# Patient Record
Sex: Female | Born: 1935 | Race: White | Hispanic: No | Smoking: Never smoker
Health system: Southern US, Community
[De-identification: ages and names within clinical notes are randomized; demographics above are authoritative.]

## PROBLEM LIST (undated history)

## (undated) DIAGNOSIS — I639 Cerebral infarction, unspecified: Secondary | ICD-10-CM

## (undated) DIAGNOSIS — I10 Essential (primary) hypertension: Secondary | ICD-10-CM

## (undated) DIAGNOSIS — T7840XA Allergy, unspecified, initial encounter: Secondary | ICD-10-CM

## (undated) DIAGNOSIS — D519 Vitamin B12 deficiency anemia, unspecified: Secondary | ICD-10-CM

## (undated) DIAGNOSIS — E785 Hyperlipidemia, unspecified: Secondary | ICD-10-CM

## (undated) DIAGNOSIS — E079 Disorder of thyroid, unspecified: Secondary | ICD-10-CM

## (undated) HISTORY — DX: Vitamin B12 deficiency anemia, unspecified: D51.9

## (undated) HISTORY — DX: Disorder of thyroid, unspecified: E07.9

## (undated) HISTORY — DX: Hyperlipidemia, unspecified: E78.5

## (undated) HISTORY — DX: Essential (primary) hypertension: I10

## (undated) HISTORY — DX: Allergy, unspecified, initial encounter: T78.40XA

## (undated) HISTORY — PX: ECTOPIC PREGNANCY SURGERY: SHX613

## (undated) HISTORY — DX: Cerebral infarction, unspecified: I63.9

---

## 2004-07-01 ENCOUNTER — Ambulatory Visit: Payer: Self-pay | Admitting: Family Medicine

## 2005-08-18 ENCOUNTER — Ambulatory Visit: Payer: Self-pay | Admitting: Family Medicine

## 2006-07-06 ENCOUNTER — Ambulatory Visit: Payer: Self-pay | Admitting: Gastroenterology

## 2006-08-19 ENCOUNTER — Ambulatory Visit: Payer: Self-pay | Admitting: Family Medicine

## 2007-05-06 ENCOUNTER — Ambulatory Visit: Payer: Self-pay | Admitting: Family Medicine

## 2007-09-09 ENCOUNTER — Ambulatory Visit: Payer: Self-pay | Admitting: Family Medicine

## 2008-11-23 ENCOUNTER — Ambulatory Visit: Payer: Self-pay | Admitting: Family Medicine

## 2009-12-17 ENCOUNTER — Ambulatory Visit: Payer: Self-pay | Admitting: Family Medicine

## 2010-08-06 ENCOUNTER — Ambulatory Visit: Payer: Self-pay | Admitting: Family Medicine

## 2010-08-11 HISTORY — PX: COLONOSCOPY: SHX174

## 2011-01-02 ENCOUNTER — Ambulatory Visit: Payer: Self-pay | Admitting: Family Medicine

## 2011-08-15 DIAGNOSIS — E039 Hypothyroidism, unspecified: Secondary | ICD-10-CM | POA: Diagnosis not present

## 2011-08-15 DIAGNOSIS — I10 Essential (primary) hypertension: Secondary | ICD-10-CM | POA: Diagnosis not present

## 2011-08-15 DIAGNOSIS — E785 Hyperlipidemia, unspecified: Secondary | ICD-10-CM | POA: Diagnosis not present

## 2011-08-15 DIAGNOSIS — J209 Acute bronchitis, unspecified: Secondary | ICD-10-CM | POA: Diagnosis not present

## 2011-11-19 DIAGNOSIS — N390 Urinary tract infection, site not specified: Secondary | ICD-10-CM | POA: Diagnosis not present

## 2011-11-19 DIAGNOSIS — J209 Acute bronchitis, unspecified: Secondary | ICD-10-CM | POA: Diagnosis not present

## 2011-12-31 DIAGNOSIS — Z0000 Encounter for general adult medical examination without abnormal findings: Secondary | ICD-10-CM | POA: Diagnosis not present

## 2012-01-01 DIAGNOSIS — H40029 Open angle with borderline findings, high risk, unspecified eye: Secondary | ICD-10-CM | POA: Diagnosis not present

## 2012-01-01 DIAGNOSIS — H2510 Age-related nuclear cataract, unspecified eye: Secondary | ICD-10-CM | POA: Diagnosis not present

## 2012-01-14 ENCOUNTER — Ambulatory Visit: Payer: Self-pay | Admitting: Family Medicine

## 2012-01-14 DIAGNOSIS — Z1231 Encounter for screening mammogram for malignant neoplasm of breast: Secondary | ICD-10-CM | POA: Diagnosis not present

## 2012-01-26 DIAGNOSIS — J209 Acute bronchitis, unspecified: Secondary | ICD-10-CM | POA: Diagnosis not present

## 2012-01-26 DIAGNOSIS — J019 Acute sinusitis, unspecified: Secondary | ICD-10-CM | POA: Diagnosis not present

## 2012-02-02 DIAGNOSIS — H40029 Open angle with borderline findings, high risk, unspecified eye: Secondary | ICD-10-CM | POA: Diagnosis not present

## 2012-05-17 DIAGNOSIS — Z23 Encounter for immunization: Secondary | ICD-10-CM | POA: Diagnosis not present

## 2012-08-26 ENCOUNTER — Ambulatory Visit: Payer: Self-pay | Admitting: Family Medicine

## 2012-08-26 DIAGNOSIS — M5137 Other intervertebral disc degeneration, lumbosacral region: Secondary | ICD-10-CM | POA: Diagnosis not present

## 2012-08-26 DIAGNOSIS — E785 Hyperlipidemia, unspecified: Secondary | ICD-10-CM | POA: Diagnosis not present

## 2012-08-26 DIAGNOSIS — N390 Urinary tract infection, site not specified: Secondary | ICD-10-CM | POA: Diagnosis not present

## 2012-08-26 DIAGNOSIS — E039 Hypothyroidism, unspecified: Secondary | ICD-10-CM | POA: Diagnosis not present

## 2012-08-26 DIAGNOSIS — I10 Essential (primary) hypertension: Secondary | ICD-10-CM | POA: Diagnosis not present

## 2012-08-26 DIAGNOSIS — D510 Vitamin B12 deficiency anemia due to intrinsic factor deficiency: Secondary | ICD-10-CM | POA: Diagnosis not present

## 2013-01-18 DIAGNOSIS — E039 Hypothyroidism, unspecified: Secondary | ICD-10-CM | POA: Diagnosis not present

## 2013-01-18 DIAGNOSIS — I10 Essential (primary) hypertension: Secondary | ICD-10-CM | POA: Diagnosis not present

## 2013-01-18 DIAGNOSIS — G589 Mononeuropathy, unspecified: Secondary | ICD-10-CM | POA: Diagnosis not present

## 2013-01-18 DIAGNOSIS — I43 Cardiomyopathy in diseases classified elsewhere: Secondary | ICD-10-CM | POA: Diagnosis not present

## 2013-01-18 DIAGNOSIS — E785 Hyperlipidemia, unspecified: Secondary | ICD-10-CM | POA: Diagnosis not present

## 2013-01-26 ENCOUNTER — Ambulatory Visit: Payer: Self-pay | Admitting: Family Medicine

## 2013-01-26 DIAGNOSIS — I6529 Occlusion and stenosis of unspecified carotid artery: Secondary | ICD-10-CM | POA: Diagnosis not present

## 2013-01-26 DIAGNOSIS — E785 Hyperlipidemia, unspecified: Secondary | ICD-10-CM | POA: Diagnosis not present

## 2013-01-26 DIAGNOSIS — N959 Unspecified menopausal and perimenopausal disorder: Secondary | ICD-10-CM | POA: Diagnosis not present

## 2013-02-09 ENCOUNTER — Ambulatory Visit: Payer: Self-pay | Admitting: Family Medicine

## 2013-02-09 DIAGNOSIS — Z1231 Encounter for screening mammogram for malignant neoplasm of breast: Secondary | ICD-10-CM | POA: Diagnosis not present

## 2013-02-09 DIAGNOSIS — M899 Disorder of bone, unspecified: Secondary | ICD-10-CM | POA: Diagnosis not present

## 2013-02-16 DIAGNOSIS — D2320 Other benign neoplasm of skin of unspecified ear and external auricular canal: Secondary | ICD-10-CM | POA: Diagnosis not present

## 2013-02-16 DIAGNOSIS — L821 Other seborrheic keratosis: Secondary | ICD-10-CM | POA: Diagnosis not present

## 2013-02-16 DIAGNOSIS — D485 Neoplasm of uncertain behavior of skin: Secondary | ICD-10-CM | POA: Diagnosis not present

## 2013-02-16 DIAGNOSIS — L570 Actinic keratosis: Secondary | ICD-10-CM | POA: Diagnosis not present

## 2013-02-16 DIAGNOSIS — M7130 Other bursal cyst, unspecified site: Secondary | ICD-10-CM | POA: Diagnosis not present

## 2013-02-16 DIAGNOSIS — Z1283 Encounter for screening for malignant neoplasm of skin: Secondary | ICD-10-CM | POA: Diagnosis not present

## 2013-02-16 DIAGNOSIS — D1801 Hemangioma of skin and subcutaneous tissue: Secondary | ICD-10-CM | POA: Diagnosis not present

## 2013-03-24 DIAGNOSIS — H2510 Age-related nuclear cataract, unspecified eye: Secondary | ICD-10-CM | POA: Diagnosis not present

## 2013-03-24 DIAGNOSIS — H40029 Open angle with borderline findings, high risk, unspecified eye: Secondary | ICD-10-CM | POA: Diagnosis not present

## 2013-03-30 DIAGNOSIS — D485 Neoplasm of uncertain behavior of skin: Secondary | ICD-10-CM | POA: Diagnosis not present

## 2013-03-30 DIAGNOSIS — D2320 Other benign neoplasm of skin of unspecified ear and external auricular canal: Secondary | ICD-10-CM | POA: Diagnosis not present

## 2013-05-18 DIAGNOSIS — Z23 Encounter for immunization: Secondary | ICD-10-CM | POA: Diagnosis not present

## 2013-05-21 IMAGING — CR DG LUMBAR SPINE COMPLETE 4+V
1 series · 5 of 5 positions shown · non-contrast
Comparison: none

REASON FOR EXAM: back pain
COMMENTS:

PROCEDURE:     MDR - M[REDACTED] SPINE WITH OBLIQUES  - August 26, 2012 [DATE]
RESULT:     Comparison: None.

[Series 1: ap · 0.17mm/px · 5 of 5 slices shown]
[im 1/5]
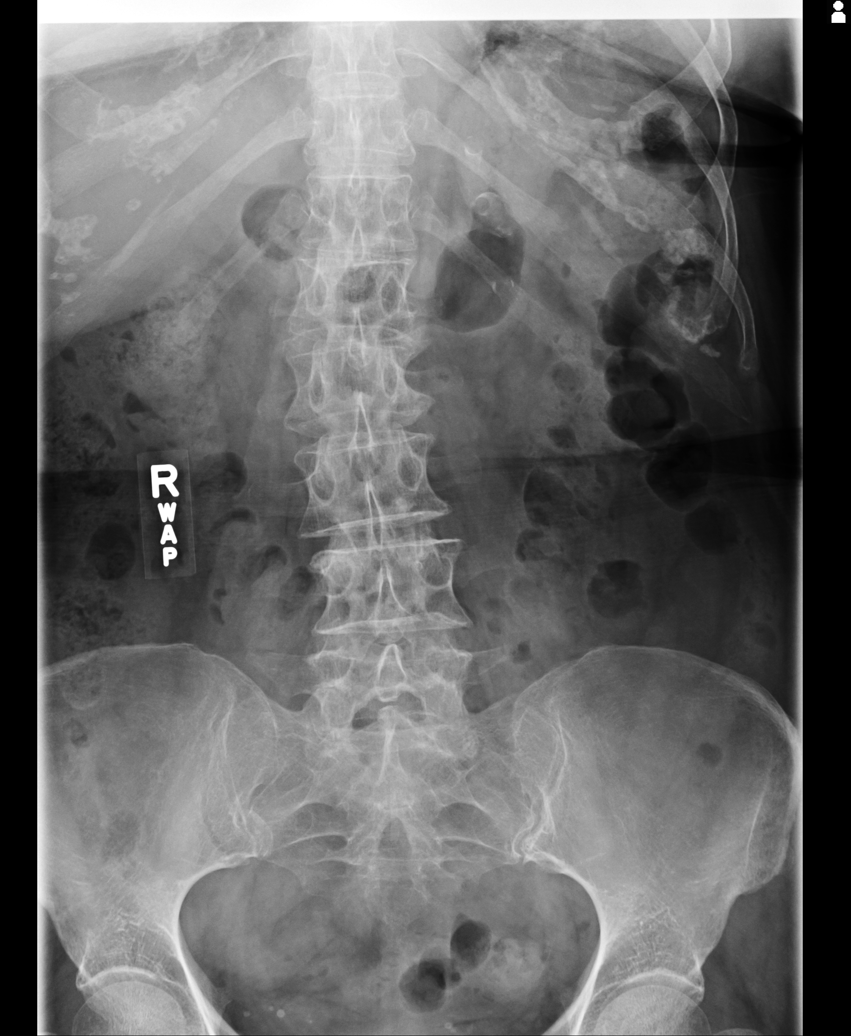
[im 2/5]
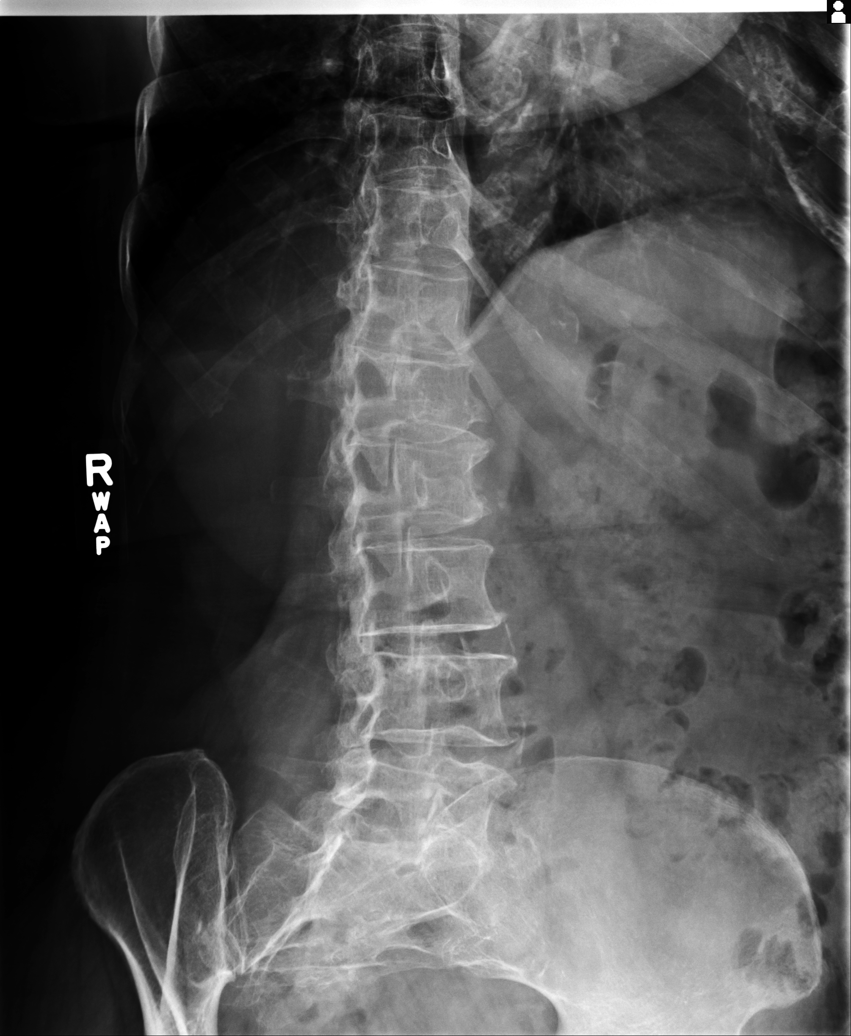
[im 3/5]
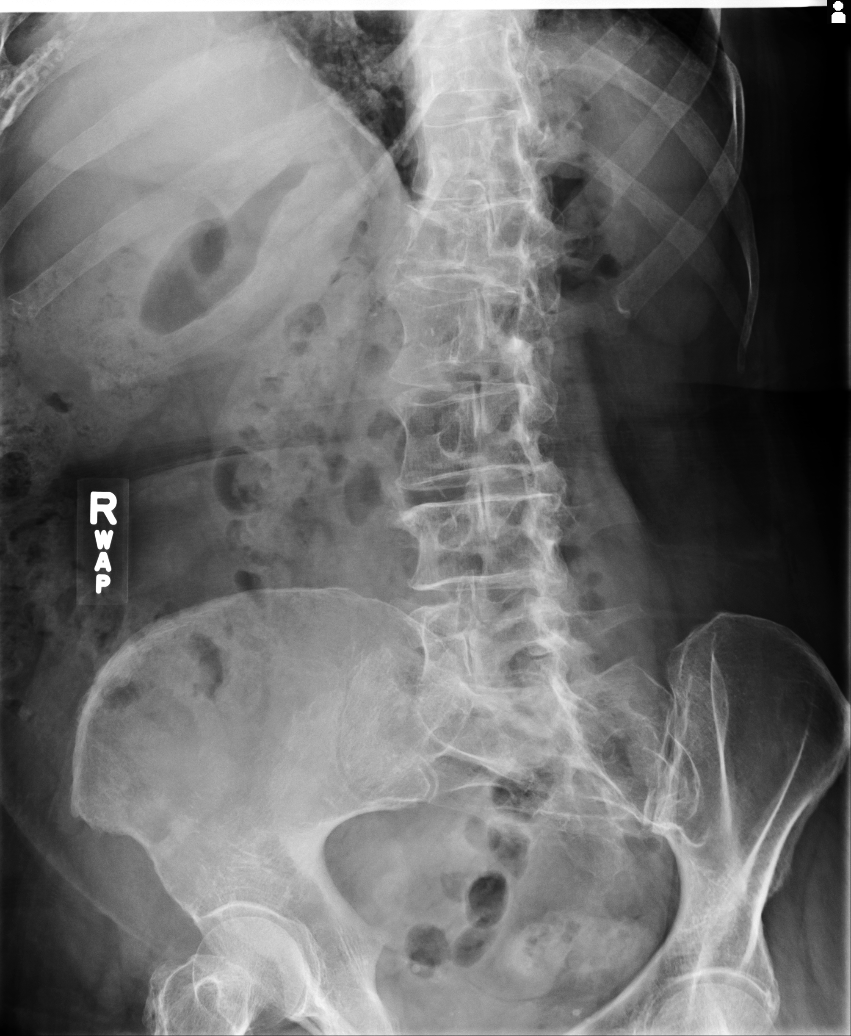
[im 4/5]
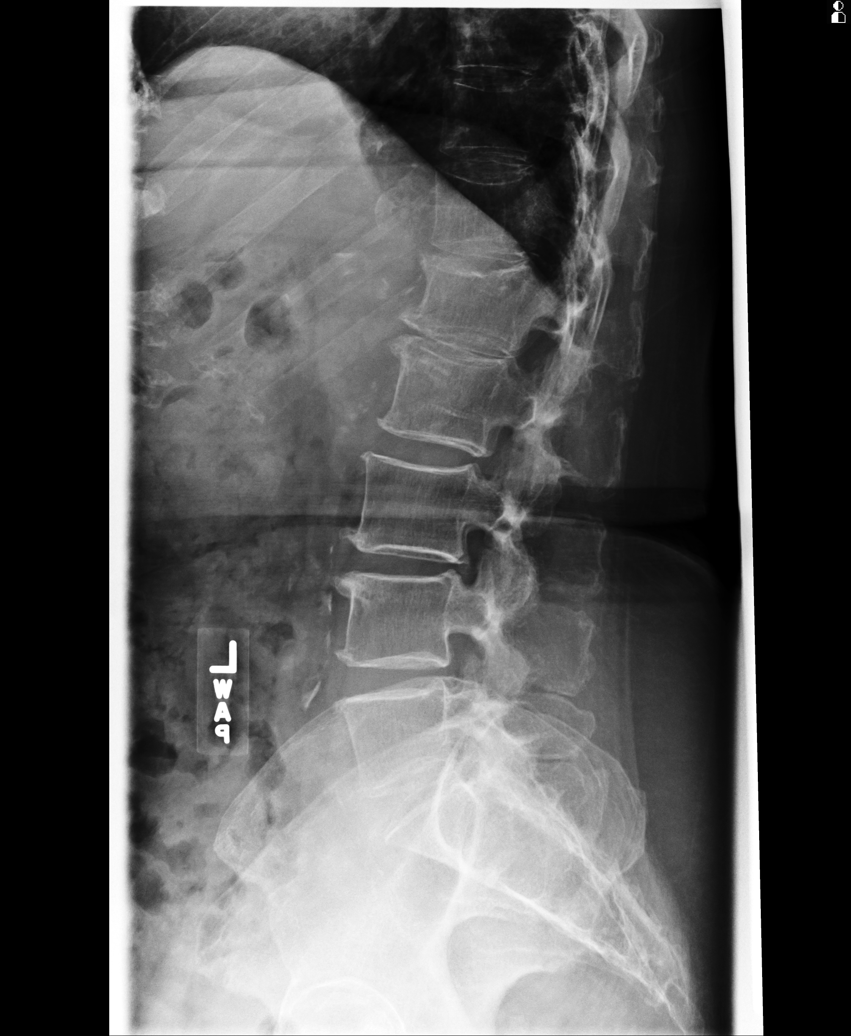
[im 5/5]
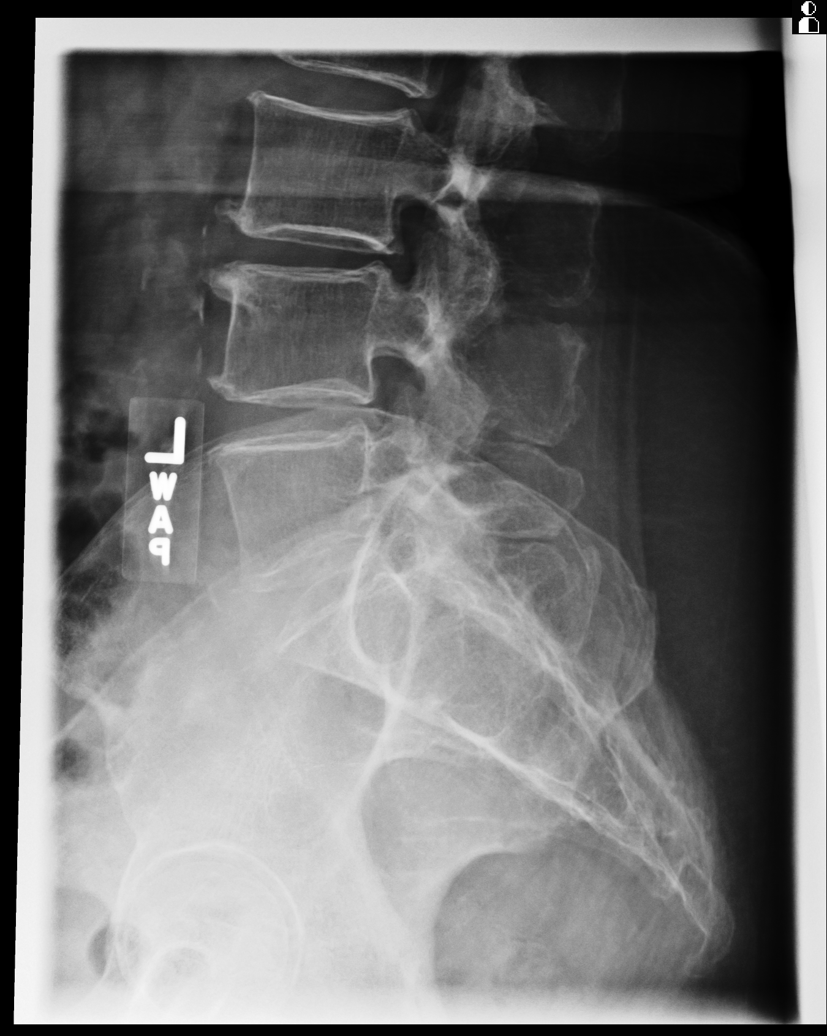

[5 of 5 positions shown; findings below may reference images not displayed]

FINDINGS: There are 5 lumbar type vertebral bodies. There is slight dextrocurvature of
the lumbar spine. No pars defect seen on the lateral view. There is mild
age-indeterminate anterior wedge compression deformity of the L1 vertebral
body. Mild intervertebral disc height loss and osteophytosis are seen at
T12-L1 and L1-L2. There is normal alignment. Calcifications are seen in
abdominal aorta.
IMPRESSION: 1. Mild degenerative disc disease in the upper lumbar spine.
2. Mild age-indeterminate anterior wedge compression deformity of the L1
vertebral body. Correlate with patient's history and site of pain.

[REDACTED]

## 2013-10-20 DIAGNOSIS — H2510 Age-related nuclear cataract, unspecified eye: Secondary | ICD-10-CM | POA: Diagnosis not present

## 2013-10-26 DIAGNOSIS — I10 Essential (primary) hypertension: Secondary | ICD-10-CM | POA: Diagnosis not present

## 2013-10-26 DIAGNOSIS — E039 Hypothyroidism, unspecified: Secondary | ICD-10-CM | POA: Diagnosis not present

## 2013-10-26 DIAGNOSIS — D510 Vitamin B12 deficiency anemia due to intrinsic factor deficiency: Secondary | ICD-10-CM | POA: Diagnosis not present

## 2013-10-26 DIAGNOSIS — E785 Hyperlipidemia, unspecified: Secondary | ICD-10-CM | POA: Diagnosis not present

## 2013-10-26 DIAGNOSIS — J3089 Other allergic rhinitis: Secondary | ICD-10-CM | POA: Diagnosis not present

## 2013-10-26 DIAGNOSIS — G589 Mononeuropathy, unspecified: Secondary | ICD-10-CM | POA: Diagnosis not present

## 2013-12-08 DIAGNOSIS — H40029 Open angle with borderline findings, high risk, unspecified eye: Secondary | ICD-10-CM | POA: Diagnosis not present

## 2013-12-09 DIAGNOSIS — R799 Abnormal finding of blood chemistry, unspecified: Secondary | ICD-10-CM | POA: Diagnosis not present

## 2014-01-03 DIAGNOSIS — H02839 Dermatochalasis of unspecified eye, unspecified eyelid: Secondary | ICD-10-CM | POA: Diagnosis not present

## 2014-01-03 DIAGNOSIS — H25019 Cortical age-related cataract, unspecified eye: Secondary | ICD-10-CM | POA: Diagnosis not present

## 2014-01-03 DIAGNOSIS — H25049 Posterior subcapsular polar age-related cataract, unspecified eye: Secondary | ICD-10-CM | POA: Diagnosis not present

## 2014-01-03 DIAGNOSIS — H2510 Age-related nuclear cataract, unspecified eye: Secondary | ICD-10-CM | POA: Diagnosis not present

## 2014-01-03 DIAGNOSIS — H18419 Arcus senilis, unspecified eye: Secondary | ICD-10-CM | POA: Diagnosis not present

## 2014-02-13 DIAGNOSIS — H269 Unspecified cataract: Secondary | ICD-10-CM | POA: Diagnosis not present

## 2014-02-13 DIAGNOSIS — H2510 Age-related nuclear cataract, unspecified eye: Secondary | ICD-10-CM | POA: Diagnosis not present

## 2014-02-14 DIAGNOSIS — H2510 Age-related nuclear cataract, unspecified eye: Secondary | ICD-10-CM | POA: Diagnosis not present

## 2014-02-14 DIAGNOSIS — H25019 Cortical age-related cataract, unspecified eye: Secondary | ICD-10-CM | POA: Diagnosis not present

## 2014-02-14 DIAGNOSIS — H25049 Posterior subcapsular polar age-related cataract, unspecified eye: Secondary | ICD-10-CM | POA: Diagnosis not present

## 2014-02-14 DIAGNOSIS — Z961 Presence of intraocular lens: Secondary | ICD-10-CM | POA: Diagnosis not present

## 2014-02-16 DIAGNOSIS — Z0000 Encounter for general adult medical examination without abnormal findings: Secondary | ICD-10-CM | POA: Diagnosis not present

## 2014-02-28 ENCOUNTER — Ambulatory Visit: Payer: Self-pay | Admitting: Family Medicine

## 2014-02-28 DIAGNOSIS — Z1231 Encounter for screening mammogram for malignant neoplasm of breast: Secondary | ICD-10-CM | POA: Diagnosis not present

## 2014-03-06 DIAGNOSIS — H2510 Age-related nuclear cataract, unspecified eye: Secondary | ICD-10-CM | POA: Diagnosis not present

## 2014-03-06 DIAGNOSIS — H269 Unspecified cataract: Secondary | ICD-10-CM | POA: Diagnosis not present

## 2014-03-14 DIAGNOSIS — L570 Actinic keratosis: Secondary | ICD-10-CM | POA: Diagnosis not present

## 2014-03-14 DIAGNOSIS — L821 Other seborrheic keratosis: Secondary | ICD-10-CM | POA: Diagnosis not present

## 2014-03-14 DIAGNOSIS — B372 Candidiasis of skin and nail: Secondary | ICD-10-CM | POA: Diagnosis not present

## 2014-03-14 DIAGNOSIS — Z872 Personal history of diseases of the skin and subcutaneous tissue: Secondary | ICD-10-CM | POA: Diagnosis not present

## 2014-03-14 DIAGNOSIS — Z1283 Encounter for screening for malignant neoplasm of skin: Secondary | ICD-10-CM | POA: Diagnosis not present

## 2014-06-05 DIAGNOSIS — J3081 Allergic rhinitis due to animal (cat) (dog) hair and dander: Secondary | ICD-10-CM | POA: Diagnosis not present

## 2014-06-05 DIAGNOSIS — J0140 Acute pansinusitis, unspecified: Secondary | ICD-10-CM | POA: Diagnosis not present

## 2014-07-03 DIAGNOSIS — I10 Essential (primary) hypertension: Secondary | ICD-10-CM | POA: Diagnosis not present

## 2014-07-03 DIAGNOSIS — I43 Cardiomyopathy in diseases classified elsewhere: Secondary | ICD-10-CM | POA: Diagnosis not present

## 2014-07-03 DIAGNOSIS — E784 Other hyperlipidemia: Secondary | ICD-10-CM | POA: Diagnosis not present

## 2014-07-03 DIAGNOSIS — J3081 Allergic rhinitis due to animal (cat) (dog) hair and dander: Secondary | ICD-10-CM | POA: Diagnosis not present

## 2014-07-03 DIAGNOSIS — G629 Polyneuropathy, unspecified: Secondary | ICD-10-CM | POA: Diagnosis not present

## 2014-07-03 DIAGNOSIS — E039 Hypothyroidism, unspecified: Secondary | ICD-10-CM | POA: Diagnosis not present

## 2014-08-17 DIAGNOSIS — H3531 Nonexudative age-related macular degeneration: Secondary | ICD-10-CM | POA: Diagnosis not present

## 2014-08-17 DIAGNOSIS — H40023 Open angle with borderline findings, high risk, bilateral: Secondary | ICD-10-CM | POA: Diagnosis not present

## 2014-10-05 DIAGNOSIS — H40023 Open angle with borderline findings, high risk, bilateral: Secondary | ICD-10-CM | POA: Diagnosis not present

## 2014-11-28 DIAGNOSIS — L03032 Cellulitis of left toe: Secondary | ICD-10-CM | POA: Diagnosis not present

## 2015-01-15 ENCOUNTER — Other Ambulatory Visit: Payer: Self-pay | Admitting: Family Medicine

## 2015-01-15 DIAGNOSIS — E039 Hypothyroidism, unspecified: Secondary | ICD-10-CM

## 2015-01-29 ENCOUNTER — Other Ambulatory Visit: Payer: Self-pay | Admitting: Family Medicine

## 2015-01-29 DIAGNOSIS — E785 Hyperlipidemia, unspecified: Secondary | ICD-10-CM

## 2015-02-11 ENCOUNTER — Other Ambulatory Visit: Payer: Self-pay | Admitting: Family Medicine

## 2015-02-11 DIAGNOSIS — I10 Essential (primary) hypertension: Secondary | ICD-10-CM

## 2015-02-22 ENCOUNTER — Other Ambulatory Visit: Payer: Self-pay | Admitting: Family Medicine

## 2015-02-22 DIAGNOSIS — J302 Other seasonal allergic rhinitis: Secondary | ICD-10-CM

## 2015-02-23 ENCOUNTER — Other Ambulatory Visit: Payer: Self-pay | Admitting: Family Medicine

## 2015-02-23 DIAGNOSIS — G629 Polyneuropathy, unspecified: Secondary | ICD-10-CM

## 2015-02-26 ENCOUNTER — Other Ambulatory Visit: Payer: Self-pay | Admitting: Family Medicine

## 2015-03-14 ENCOUNTER — Other Ambulatory Visit: Payer: Self-pay | Admitting: Family Medicine

## 2015-03-14 DIAGNOSIS — I10 Essential (primary) hypertension: Secondary | ICD-10-CM

## 2015-03-21 ENCOUNTER — Other Ambulatory Visit: Payer: Self-pay | Admitting: Family Medicine

## 2015-03-21 DIAGNOSIS — J3089 Other allergic rhinitis: Secondary | ICD-10-CM

## 2015-03-23 ENCOUNTER — Other Ambulatory Visit: Payer: Self-pay | Admitting: Family Medicine

## 2015-03-26 ENCOUNTER — Other Ambulatory Visit: Payer: Self-pay | Admitting: Family Medicine

## 2015-03-26 DIAGNOSIS — E785 Hyperlipidemia, unspecified: Secondary | ICD-10-CM

## 2015-03-27 ENCOUNTER — Ambulatory Visit (INDEPENDENT_AMBULATORY_CARE_PROVIDER_SITE_OTHER): Payer: Medicare Other | Admitting: Family Medicine

## 2015-03-27 ENCOUNTER — Encounter: Payer: Self-pay | Admitting: Family Medicine

## 2015-03-27 VITALS — BP 112/62 | HR 64 | Ht 62.0 in | Wt 158.0 lb

## 2015-03-27 DIAGNOSIS — E039 Hypothyroidism, unspecified: Secondary | ICD-10-CM | POA: Insufficient documentation

## 2015-03-27 DIAGNOSIS — I10 Essential (primary) hypertension: Secondary | ICD-10-CM

## 2015-03-27 DIAGNOSIS — E785 Hyperlipidemia, unspecified: Secondary | ICD-10-CM | POA: Diagnosis not present

## 2015-03-27 DIAGNOSIS — Z23 Encounter for immunization: Secondary | ICD-10-CM | POA: Diagnosis not present

## 2015-03-27 DIAGNOSIS — J3089 Other allergic rhinitis: Secondary | ICD-10-CM | POA: Diagnosis not present

## 2015-03-27 DIAGNOSIS — J301 Allergic rhinitis due to pollen: Secondary | ICD-10-CM

## 2015-03-27 MED ORDER — SIMVASTATIN 20 MG PO TABS: 20.0000 mg | ORAL_TABLET | Freq: Every day | ORAL | Status: DC

## 2015-03-27 MED ORDER — LABETALOL HCL 300 MG PO TABS: 300.0000 mg | ORAL_TABLET | Freq: Every day | ORAL | Status: DC

## 2015-03-27 MED ORDER — LORATADINE 10 MG PO TABS: 10.0000 mg | ORAL_TABLET | Freq: Every day | ORAL | Status: DC

## 2015-03-27 MED ORDER — LOSARTAN POTASSIUM-HCTZ 50-12.5 MG PO TABS
1.0000 | ORAL_TABLET | Freq: Every day | ORAL | Status: DC
Start: 2015-03-27 — End: 2015-09-27

## 2015-03-27 MED ORDER — LEVOTHYROXINE SODIUM 50 MCG PO TABS: 50.0000 ug | ORAL_TABLET | Freq: Every day | ORAL | Status: DC

## 2015-03-27 NOTE — Progress Notes (Signed)
Name: Carly Marshall   MRN: [id number]    DOB: 28-Apr-1936   Date:03/27/2015       Progress Note  Subjective  Chief Complaint  Chief Complaint  Patient presents with  . Hyperlipidemia  . Hypertension  . Hypothyroidism  . Allergic Rhinitis     Hyperlipidemia This is a chronic problem. The current episode started more than 1 year ago. The problem is controlled. Recent lipid tests were reviewed and are normal. Exacerbating diseases include obesity. She has no history of chronic renal disease, diabetes, hypothyroidism, liver disease or nephrotic syndrome. Factors aggravating her hyperlipidemia include beta blockers and thiazides. Pertinent negatives include no chest pain, focal sensory loss, focal weakness, leg pain, myalgias or shortness of breath. Current antihyperlipidemic treatment includes statins. The current treatment provides moderate improvement of lipids. There are no compliance problems.  Risk factors for coronary artery disease include dyslipidemia, hypertension and post-menopausal.  Hypertension This is a chronic problem. The current episode started more than 1 year ago. The problem has been gradually improving since onset. Pertinent negatives include no anxiety, blurred vision, chest pain, headaches, malaise/fatigue, neck pain, orthopnea, palpitations, peripheral edema, PND, shortness of breath or sweats. Agents associated with hypertension include thyroid hormones. Past treatments include ACE inhibitors, beta blockers, calcium channel blockers and diuretics. The current treatment provides mild improvement. There are no compliance problems.  Hypertensive end-organ damage includes a thyroid problem. There is no history of angina, kidney disease, CAD/MI, CVA, heart failure, left ventricular hypertrophy, PVD, renovascular disease or retinopathy. There is no history of chronic renal disease.  Allergic Reaction This is a chronic problem. The problem occurs intermittently. Associated symptoms  include eye itching and eye watering. Pertinent negatives include no abdominal pain, chest pain, coughing, diarrhea, difficulty breathing, rash or wheezing. There is no swelling present. Past treatments include one or more OTC medications.  Thyroid Problem Presents for follow-up visit. Symptoms include weight gain. Patient reports no anxiety, cold intolerance, constipation, depressed mood, diaphoresis, diarrhea, dry skin, fatigue, hair loss, heat intolerance, hoarse voice, leg swelling, menstrual problem, nail problem, palpitations, tremors, visual change or weight loss. The symptoms have been stable. Past treatments include levothyroxine. The treatment provided moderate relief. Her past medical history is significant for hyperlipidemia. There is no history of atrial fibrillation, dementia, diabetes, heart failure, neuropathy or osteopenia.    No problem-specific assessment & plan notes found for this encounter.   Past Medical History  Diagnosis Date  . Hyperlipidemia   . Hypertension   . Thyroid disease   . Allergy     Past Surgical History  Procedure Laterality Date  . Ectopic pregnancy surgery    . Colonoscopy  2012    normal- Dr Sonny Masters    History reviewed. No pertinent family history.  Social History   Social History  . Marital Status: Married    Spouse Name: N/A  . Number of Children: N/A  . Years of Education: N/A   Occupational History  . Not on file.   Social History Main Topics  . Smoking status: Never Smoker   . Smokeless tobacco: Not on file  . Alcohol Use: No  . Drug Use: No  . Sexual Activity: No   Other Topics Concern  . Not on file   Social History Narrative  . No narrative on file    No Known Allergies   Review of Systems  Constitutional: Positive for weight gain. Negative for fever, chills, weight loss, malaise/fatigue, diaphoresis and fatigue.  HENT: Negative for ear  discharge, ear pain, hoarse voice and sore throat.   Eyes: Positive for  itching. Negative for blurred vision.  Respiratory: Negative for cough, sputum production, shortness of breath and wheezing.   Cardiovascular: Negative for chest pain, palpitations, orthopnea, leg swelling and PND.  Gastrointestinal: Negative for heartburn, nausea, abdominal pain, diarrhea, constipation, blood in stool and melena.  Genitourinary: Negative for dysuria, urgency, frequency, hematuria and menstrual problem.  Musculoskeletal: Negative for myalgias, back pain, joint pain and neck pain.  Skin: Negative for rash.  Neurological: Negative for dizziness, tingling, tremors, sensory change, focal weakness and headaches.  Endo/Heme/Allergies: Negative for environmental allergies, cold intolerance, heat intolerance and polydipsia. Does not bruise/bleed easily.  Psychiatric/Behavioral: Negative for depression and suicidal ideas. The patient is not nervous/anxious and does not have insomnia.      Objective  Filed Vitals:   03/27/15 1405  BP: 112/62  Pulse: 64  Height: 5\' 2"  (1.575 m)  Weight: 158 lb (71.668 kg)    Physical Exam  Constitutional: She is well-developed, well-nourished, and in no distress. No distress.  HENT:  Head: Normocephalic and atraumatic.  Right Ear: External ear normal.  Left Ear: External ear normal.  Nose: Nose normal.  Mouth/Throat: Oropharynx is clear and moist.  Eyes: Conjunctivae and EOM are normal. Pupils are equal, round, and reactive to light. Right eye exhibits no discharge. Left eye exhibits no discharge.  Neck: Normal range of motion. Neck supple. No JVD present. No thyromegaly present.  Cardiovascular: Normal rate, regular rhythm, normal heart sounds and intact distal pulses.  Exam reveals no gallop and no friction rub.   No murmur heard. Pulmonary/Chest: Effort normal and breath sounds normal.  Abdominal: Soft. Bowel sounds are normal. She exhibits no mass. There is no tenderness. There is no guarding.  Musculoskeletal: Normal range of motion.  She exhibits no edema.  Lymphadenopathy:    She has no cervical adenopathy.  Neurological: She is alert.  Skin: Skin is warm and dry. She is not diaphoretic.  Psychiatric: Mood and affect normal.  Nursing note and vitals reviewed.     Assessment & Plan  Problem List Items Addressed This Visit      Cardiovascular and Mediastinum   Essential hypertension - Primary   Relevant Medications   labetalol (NORMODYNE) 300 MG tablet   losartan-hydrochlorothiazide (HYZAAR) 50-12.5 MG per tablet   simvastatin (ZOCOR) 20 MG tablet   Other Relevant Orders   Renal Function Panel     Respiratory   Allergic rhinitis due to pollen     Endocrine   Thyroid activity decreased   Relevant Medications   labetalol (NORMODYNE) 300 MG tablet   levothyroxine (SYNTHROID, LEVOTHROID) 50 MCG tablet   Other Relevant Orders   TSH     Other   Hyperlipidemia   Relevant Medications   labetalol (NORMODYNE) 300 MG tablet   losartan-hydrochlorothiazide (HYZAAR) 50-12.5 MG per tablet   simvastatin (ZOCOR) 20 MG tablet   Other Relevant Orders   Lipid Profile    Other Visit Diagnoses    Other allergic rhinitis        Relevant Medications    loratadine (CLARITIN) 10 MG tablet    Need for pneumococcal vaccination        Relevant Orders    Pneumococcal polysaccharide vaccine 23-valent greater than or equal to 2yo subcutaneous/IM (Completed)    Need for Tdap vaccination        Relevant Orders    Tdap vaccine greater than or equal to 7yo IM (Completed)  Dr. [facility]  03/27/2015

## 2015-03-28 LAB — RENAL FUNCTION PANEL
Albumin: 4.4 g/dL (ref 3.5–4.8)
BUN/Creatinine Ratio: 17 (ref 11–26)
BUN: 19 mg/dL (ref 8–27)
CO2: 26 mmol/L (ref 18–29)
Calcium: 10 mg/dL (ref 8.7–10.3)
Chloride: 101 mmol/L (ref 97–108)
Creatinine, Ser: 1.09 mg/dL — ABNORMAL HIGH (ref 0.57–1.00)
GFR calc Af Amer: 57 mL/min/{1.73_m2} — ABNORMAL LOW (ref >59)
GFR calc non Af Amer: 49 mL/min/{1.73_m2} — ABNORMAL LOW (ref >59)
Glucose: 107 mg/dL — ABNORMAL HIGH (ref 65–99)
Phosphorus: 3.3 mg/dL (ref 2.5–4.5)
Potassium: 3.7 mmol/L (ref 3.5–5.2)
Sodium: 145 mmol/L — ABNORMAL HIGH (ref 134–144)

## 2015-03-28 LAB — TSH: TSH: 1.78 u[IU]/mL (ref 0.450–4.500)

## 2015-03-28 LAB — LIPID PANEL
Chol/HDL Ratio: 3.1 ratio units (ref 0.0–4.4)
Cholesterol, Total: 132 mg/dL (ref 100–199)
HDL: 43 mg/dL (ref >39)
LDL Calculated: 52 mg/dL (ref 0–99)
Triglycerides: 185 mg/dL — ABNORMAL HIGH (ref 0–149)
VLDL Cholesterol Cal: 37 mg/dL (ref 5–40)

## 2015-05-14 DIAGNOSIS — L600 Ingrowing nail: Secondary | ICD-10-CM | POA: Diagnosis not present

## 2015-05-14 DIAGNOSIS — B372 Candidiasis of skin and nail: Secondary | ICD-10-CM | POA: Diagnosis not present

## 2015-05-14 DIAGNOSIS — Z1283 Encounter for screening for malignant neoplasm of skin: Secondary | ICD-10-CM | POA: Diagnosis not present

## 2015-05-22 DIAGNOSIS — Z23 Encounter for immunization: Secondary | ICD-10-CM | POA: Diagnosis not present

## 2015-05-29 ENCOUNTER — Ambulatory Visit (INDEPENDENT_AMBULATORY_CARE_PROVIDER_SITE_OTHER): Payer: Medicare Other | Admitting: Family Medicine

## 2015-05-29 ENCOUNTER — Encounter: Payer: Self-pay | Admitting: Family Medicine

## 2015-05-29 VITALS — BP 130/70 | HR 72 | Ht 62.0 in | Wt 151.0 lb

## 2015-05-29 DIAGNOSIS — Z021 Encounter for pre-employment examination: Secondary | ICD-10-CM | POA: Diagnosis not present

## 2015-05-29 NOTE — Progress Notes (Signed)
Name: Carly Marshall   MRN: [id number]    DOB: May 01, 1936   Date:05/29/2015       Progress Note  Subjective  Chief Complaint  Chief Complaint  Patient presents with  . 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   Past Medical History  Diagnosis Date  . Hyperlipidemia   . Hypertension   . Thyroid disease   . Allergy     Past Surgical History  Procedure Laterality Date  . Ectopic pregnancy surgery    . Colonoscopy  2012    normal- Dr Sonny Masters    History reviewed. No pertinent family history.  Social History   Social History  . Marital Status: Married    Spouse Name: N/A  . Number of Children: N/A  . Years of Education: N/A   Occupational History  . Not on file.   Social History Main Topics  . Smoking status: Never Smoker   . Smokeless tobacco: Not on file  . Alcohol Use: No  . Drug Use: No  . Sexual Activity: No   Other Topics Concern  . Not on file   Social History Narrative    No Known Allergies   Review of Systems  Constitutional: Negative for fever, chills, weight loss and malaise/fatigue.  HENT: Negative for ear discharge, ear pain and sore throat.   Eyes: Negative for blurred vision.  Respiratory: Negative for cough, sputum production, shortness of breath and wheezing.   Cardiovascular: Negative for chest pain, palpitations and leg swelling.  Gastrointestinal: Negative for heartburn, nausea, abdominal pain, diarrhea, constipation, blood in stool and melena.  Genitourinary: Negative for dysuria, urgency, frequency and hematuria.  Musculoskeletal: Negative for myalgias, back pain, joint pain and neck pain.  Skin: Negative for rash.  Neurological: Negative for dizziness, tingling, sensory change, focal weakness and headaches.  Endo/Heme/Allergies: Negative for environmental allergies and  polydipsia. Does not bruise/bleed easily.  Psychiatric/Behavioral: Negative for depression and suicidal ideas. The patient is not nervous/anxious and does not have insomnia.      Objective  Filed Vitals:   05/29/15 1434  BP: 130/70  Pulse: 72  Height: 5\' 2"  (1.575 m)  Weight: 151 lb (68.493 kg)    Physical Exam  Constitutional: She is well-developed, well-nourished, and in no distress. No distress.  HENT:  Head: Normocephalic and atraumatic.  Right Ear: External ear normal.  Left Ear: External ear normal.  Nose: Nose normal.  Mouth/Throat: Oropharynx is clear and moist.  Eyes: Conjunctivae and EOM are normal. Pupils are equal, round, and reactive to light. Right eye exhibits no discharge. Left eye exhibits no discharge.  Neck: Normal range of motion. Neck supple. No JVD present. No thyromegaly present.  Cardiovascular: Normal rate, regular rhythm, normal heart sounds and intact distal pulses.  Exam reveals no gallop and no friction rub.   No murmur heard. Pulmonary/Chest: Effort normal and breath sounds normal.  Abdominal: Soft. Bowel sounds are normal. She exhibits no mass. There is no tenderness. There is no guarding.  Musculoskeletal: Normal range of motion. She exhibits no edema.  Lymphadenopathy:    She has no cervical adenopathy.  Neurological: She is alert. She has normal reflexes.  Skin: Skin is warm and dry. She is not diaphoretic.  Psychiatric: Mood and affect normal.  Nursing note and vitals reviewed.     Assessment & Plan  Problem List Items Addressed This Visit    None    Visit Diagnoses    Pre-employment examination    -  Primary    Relevant Orders    PPD (Completed)         Dr. Otilio Miu [facility]  05/29/2015

## 2015-08-23 DIAGNOSIS — H01009 Unspecified blepharitis unspecified eye, unspecified eyelid: Secondary | ICD-10-CM | POA: Diagnosis not present

## 2015-08-23 DIAGNOSIS — H40013 Open angle with borderline findings, low risk, bilateral: Secondary | ICD-10-CM | POA: Diagnosis not present

## 2015-08-23 DIAGNOSIS — H35033 Hypertensive retinopathy, bilateral: Secondary | ICD-10-CM | POA: Diagnosis not present

## 2015-09-27 ENCOUNTER — Ambulatory Visit (INDEPENDENT_AMBULATORY_CARE_PROVIDER_SITE_OTHER): Payer: Medicare Other | Admitting: Family Medicine

## 2015-09-27 ENCOUNTER — Encounter: Payer: Self-pay | Admitting: Family Medicine

## 2015-09-27 VITALS — BP 120/70 | HR 64 | Ht 62.0 in | Wt 141.0 lb

## 2015-09-27 DIAGNOSIS — I10 Essential (primary) hypertension: Secondary | ICD-10-CM

## 2015-09-27 DIAGNOSIS — E785 Hyperlipidemia, unspecified: Secondary | ICD-10-CM

## 2015-09-27 DIAGNOSIS — E039 Hypothyroidism, unspecified: Secondary | ICD-10-CM

## 2015-09-27 MED ORDER — LOSARTAN POTASSIUM-HCTZ 50-12.5 MG PO TABS: 1.0000 | ORAL_TABLET | Freq: Every day | ORAL | Status: DC

## 2015-09-27 MED ORDER — SIMVASTATIN 20 MG PO TABS: 20.0000 mg | ORAL_TABLET | Freq: Every day | ORAL | Status: DC

## 2015-09-27 MED ORDER — LABETALOL HCL 300 MG PO TABS: 300.0000 mg | ORAL_TABLET | Freq: Every day | ORAL | Status: DC

## 2015-09-27 MED ORDER — LEVOTHYROXINE SODIUM 50 MCG PO TABS: 50.0000 ug | ORAL_TABLET | Freq: Every day | ORAL | Status: DC

## 2015-09-27 NOTE — Progress Notes (Signed)
Name: Carly Marshall   MRN: [id number]    DOB: 07-May-1936   Date:09/27/2015       Progress Note  Subjective  Chief Complaint  Chief Complaint  Patient presents with  . Hypertension  . Hyperlipidemia  . Hypothyroidism    Hypertension This is a chronic problem. The current episode started more than 1 year ago. The problem has been gradually improving since onset. The problem is controlled. Pertinent negatives include no anxiety, blurred vision, chest pain, headaches, malaise/fatigue, neck pain, orthopnea, palpitations, peripheral edema, PND, shortness of breath or sweats. There are no associated agents to hypertension. Risk factors for coronary artery disease include dyslipidemia and post-menopausal state. Past treatments include angiotensin blockers, calcium channel blockers and diuretics. The current treatment provides moderate improvement. There are no compliance problems.  Hypertensive end-organ damage includes a thyroid problem. There is no history of angina, kidney disease, CAD/MI, CVA, heart failure, left ventricular hypertrophy, PVD, renovascular disease or retinopathy. There is no history of chronic renal disease or a hypertension causing med.  Hyperlipidemia This is a chronic problem. The current episode started more than 1 year ago. The problem is controlled. She has no history of chronic renal disease. Pertinent negatives include no chest pain, focal weakness, myalgias or shortness of breath. Current antihyperlipidemic treatment includes statins. There are no compliance problems.   Thyroid Problem Presents for follow-up visit. Patient reports no anxiety, constipation, diaphoresis, diarrhea, hair loss, heat intolerance, palpitations, weight gain or weight loss. The symptoms have been stable. Her past medical history is significant for hyperlipidemia. There is no history of atrial fibrillation, dementia or heart failure.    No problem-specific assessment & plan notes found for this  encounter.   Past Medical History  Diagnosis Date  . Hyperlipidemia   . Hypertension   . Thyroid disease   . Allergy   . B12 deficiency anemia     Past Surgical History  Procedure Laterality Date  . Ectopic pregnancy surgery    . Colonoscopy  2012    normal- Dr Sonny Masters    History reviewed. No pertinent family history.  Social History   Social History  . Marital Status: Married    Spouse Name: N/A  . Number of Children: N/A  . Years of Education: N/A   Occupational History  . Not on file.   Social History Main Topics  . Smoking status: Never Smoker   . Smokeless tobacco: Not on file  . Alcohol Use: No  . Drug Use: No  . Sexual Activity: No   Other Topics Concern  . Not on file   Social History Narrative    No Known Allergies   Review of Systems  Constitutional: Negative for fever, chills, weight loss, weight gain, malaise/fatigue and diaphoresis.  HENT: Negative for ear discharge, ear pain and sore throat.   Eyes: Negative for blurred vision.  Respiratory: Negative for cough, sputum production, shortness of breath and wheezing.   Cardiovascular: Negative for chest pain, palpitations, orthopnea, leg swelling and PND.  Gastrointestinal: Negative for heartburn, nausea, abdominal pain, diarrhea, constipation, blood in stool and melena.  Genitourinary: Negative for dysuria, urgency, frequency and hematuria.  Musculoskeletal: Negative for myalgias, back pain, joint pain and neck pain.  Skin: Negative for rash.  Neurological: Negative for dizziness, tingling, sensory change, focal weakness and headaches.  Endo/Heme/Allergies: Negative for environmental allergies, heat intolerance and polydipsia. Does not bruise/bleed easily.  Psychiatric/Behavioral: Negative for depression and suicidal ideas. The patient is not nervous/anxious and does not have  insomnia.      Objective  Filed Vitals:   09/27/15 1406  BP: 120/70  Pulse: 64  Height: 5\' 2"  (1.575 m)  Weight:  141 lb (63.957 kg)    Physical Exam  Constitutional: She is well-developed, well-nourished, and in no distress. No distress.  HENT:  Head: Normocephalic and atraumatic.  Right Ear: External ear normal.  Left Ear: External ear normal.  Nose: Nose normal.  Mouth/Throat: Oropharynx is clear and moist.  Eyes: Conjunctivae and EOM are normal. Pupils are equal, round, and reactive to light. Right eye exhibits no discharge. Left eye exhibits no discharge.  Neck: Normal range of motion. Neck supple. No JVD present. No thyromegaly present.  Cardiovascular: Normal rate, regular rhythm, normal heart sounds and intact distal pulses.  Exam reveals no gallop and no friction rub.   No murmur heard. Pulmonary/Chest: Effort normal and breath sounds normal.  Abdominal: Soft. Bowel sounds are normal. She exhibits no mass. There is no tenderness. There is no guarding.  Musculoskeletal: Normal range of motion. She exhibits no edema.  Lymphadenopathy:    She has no cervical adenopathy.  Neurological: She is alert.  Skin: Skin is warm and dry. She is not diaphoretic.  Psychiatric: Mood and affect normal.  Nursing note and vitals reviewed.     Assessment & Plan  Problem List Items Addressed This Visit      Cardiovascular and Mediastinum   Essential hypertension - Primary   Relevant Medications   losartan-hydrochlorothiazide (HYZAAR) 50-12.5 MG tablet   simvastatin (ZOCOR) 20 MG tablet   labetalol (NORMODYNE) 300 MG tablet   Other Relevant Orders   Renal Function Panel     Endocrine   Thyroid activity decreased   Relevant Medications   levothyroxine (SYNTHROID, LEVOTHROID) 50 MCG tablet   labetalol (NORMODYNE) 300 MG tablet   Other Relevant Orders   TSH     Other   Hyperlipidemia   Relevant Medications   losartan-hydrochlorothiazide (HYZAAR) 50-12.5 MG tablet   simvastatin (ZOCOR) 20 MG tablet   labetalol (NORMODYNE) 300 MG tablet   Other Relevant Orders   Lipid Profile         Dr. Otilio Miu [facility]  09/27/2015

## 2015-09-28 LAB — RENAL FUNCTION PANEL
Albumin: 4.3 g/dL (ref 3.5–4.8)
BUN/Creatinine Ratio: 28 — ABNORMAL HIGH (ref 11–26)
BUN: 31 mg/dL — ABNORMAL HIGH (ref 8–27)
CO2: 27 mmol/L (ref 18–29)
Calcium: 9.5 mg/dL (ref 8.7–10.3)
Chloride: 97 mmol/L (ref 96–106)
Creatinine, Ser: 1.12 mg/dL — ABNORMAL HIGH (ref 0.57–1.00)
GFR calc Af Amer: 55 mL/min/{1.73_m2} — ABNORMAL LOW (ref >59)
GFR calc non Af Amer: 47 mL/min/{1.73_m2} — ABNORMAL LOW (ref >59)
Glucose: 101 mg/dL — ABNORMAL HIGH (ref 65–99)
Phosphorus: 3.4 mg/dL (ref 2.5–4.5)
Potassium: 3.5 mmol/L (ref 3.5–5.2)
Sodium: 141 mmol/L (ref 134–144)

## 2015-09-28 LAB — LIPID PANEL
Chol/HDL Ratio: 2.4 ratio units (ref 0.0–4.4)
Cholesterol, Total: 125 mg/dL (ref 100–199)
HDL: 53 mg/dL (ref >39)
LDL Calculated: 56 mg/dL (ref 0–99)
Triglycerides: 82 mg/dL (ref 0–149)
VLDL Cholesterol Cal: 16 mg/dL (ref 5–40)

## 2015-09-28 LAB — TSH: TSH: 2.16 u[IU]/mL (ref 0.450–4.500)

## 2015-11-05 ENCOUNTER — Ambulatory Visit (INDEPENDENT_AMBULATORY_CARE_PROVIDER_SITE_OTHER): Payer: Medicare Other | Admitting: Family Medicine

## 2015-11-05 ENCOUNTER — Encounter: Payer: Self-pay | Admitting: Family Medicine

## 2015-11-05 VITALS — BP 120/70 | HR 72 | Temp 98.2°F | Ht 62.0 in | Wt 137.0 lb

## 2015-11-05 DIAGNOSIS — G44209 Tension-type headache, unspecified, not intractable: Secondary | ICD-10-CM | POA: Diagnosis not present

## 2015-11-05 DIAGNOSIS — H6121 Impacted cerumen, right ear: Secondary | ICD-10-CM

## 2015-11-05 MED ORDER — PREDNISONE 10 MG PO TABS: 10.0000 mg | ORAL_TABLET | Freq: Every day | ORAL | Status: DC

## 2015-11-05 MED ORDER — TRAMADOL HCL 50 MG PO TABS: 50.0000 mg | ORAL_TABLET | Freq: Three times a day (TID) | ORAL | Status: DC | PRN

## 2015-11-05 NOTE — Progress Notes (Signed)
Name: Carly Marshall   MRN: [id number]    DOB: August 27, 1935   Date:11/05/2015       Progress Note  Subjective  Chief Complaint  Chief Complaint  Patient presents with  . Otalgia    pain in ears bilateral "shooting up into my head"- cough, cong, runny nose    Otalgia  There is pain in both ears. This is a new problem. The current episode started in the past 7 days. The problem occurs constantly. The problem has been gradually worsening. There has been no fever. The pain is at a severity of 8/10. The pain is moderate. Associated symptoms include coughing, headaches and a sore throat. Pertinent negatives include no abdominal pain, diarrhea, ear discharge, hearing loss, neck pain, rash or rhinorrhea. She has tried acetaminophen and NSAIDs for the symptoms. The treatment provided no relief. There is no history of a chronic ear infection or hearing loss.    No problem-specific assessment & plan notes found for this encounter.   Past Medical History  Diagnosis Date  . Hyperlipidemia   . Hypertension   . Thyroid disease   . Allergy   . B12 deficiency anemia     Past Surgical History  Procedure Laterality Date  . Ectopic pregnancy surgery    . Colonoscopy  2012    normal- Dr Sonny Masters    History reviewed. No pertinent family history.  Social History   Social History  . Marital Status: Married    Spouse Name: N/A  . Number of Children: N/A  . Years of Education: N/A   Occupational History  . Not on file.   Social History Main Topics  . Smoking status: Never Smoker   . Smokeless tobacco: Not on file  . Alcohol Use: No  . Drug Use: No  . Sexual Activity: No   Other Topics Concern  . Not on file   Social History Narrative    No Known Allergies   Review of Systems  Constitutional: Negative for fever, chills, weight loss and malaise/fatigue.  HENT: Positive for ear pain and sore throat. Negative for ear discharge, hearing loss and rhinorrhea.   Eyes: Negative for  blurred vision.  Respiratory: Positive for cough. Negative for sputum production, shortness of breath and wheezing.   Cardiovascular: Negative for chest pain, palpitations and leg swelling.  Gastrointestinal: Negative for heartburn, nausea, abdominal pain, diarrhea, constipation, blood in stool and melena.  Genitourinary: Negative for dysuria, urgency, frequency and hematuria.  Musculoskeletal: Negative for myalgias, back pain, joint pain and neck pain.  Skin: Negative for rash.  Neurological: Positive for headaches. Negative for dizziness, tingling, sensory change and focal weakness.  Endo/Heme/Allergies: Negative for environmental allergies and polydipsia. Does not bruise/bleed easily.  Psychiatric/Behavioral: Negative for depression and suicidal ideas. The patient is not nervous/anxious and does not have insomnia.      Objective  Filed Vitals:   11/05/15 1336  BP: 120/70  Pulse: 72  Temp: 98.2 F (36.8 C)  TempSrc: Oral  Height: 5\' 2"  (1.575 m)  Weight: 137 lb (62.143 kg)    Physical Exam  Constitutional: She is well-developed, well-nourished, and in no distress. No distress.  HENT:  Head: Normocephalic and atraumatic.  Right Ear: Hearing and external ear normal. A foreign body is present.  Left Ear: Hearing, tympanic membrane, external ear and ear canal normal.  Nose: Nose normal.  Mouth/Throat: Oropharynx is clear and moist.  Eyes: Conjunctivae and EOM are normal. Pupils are equal, round, and reactive to light. Right  eye exhibits no discharge. Left eye exhibits no discharge.  Neck: Normal range of motion. Neck supple. No JVD present. No thyromegaly present.  Cardiovascular: Normal rate, regular rhythm, normal heart sounds and intact distal pulses.  Exam reveals no gallop and no friction rub.   No murmur heard. Pulmonary/Chest: Effort normal and breath sounds normal.  Abdominal: Soft. Bowel sounds are normal. She exhibits no mass. There is no tenderness. There is no  guarding.  Musculoskeletal: Normal range of motion. She exhibits no edema.  Lymphadenopathy:    She has no cervical adenopathy.  Neurological: She is alert. She has normal sensation, normal strength, normal reflexes and intact cranial nerves.  Skin: Skin is warm and dry. She is not diaphoretic.  Psychiatric: Mood and affect normal.  Nursing note and vitals reviewed.     Assessment & Plan  Problem List Items Addressed This Visit    None    Visit Diagnoses    Tension-type headache, not intractable, unspecified chronicity pattern    -  Primary    Relevant Medications    predniSONE (DELTASONE) 10 MG tablet    traMADol (ULTRAM) 50 MG tablet    Cerumen impaction, right             Dr. [facility]  11/05/2015

## 2016-01-21 ENCOUNTER — Other Ambulatory Visit: Payer: Self-pay | Admitting: Family Medicine

## 2016-02-20 ENCOUNTER — Other Ambulatory Visit: Payer: Self-pay | Admitting: Family Medicine

## 2016-02-28 DIAGNOSIS — H40013 Open angle with borderline findings, low risk, bilateral: Secondary | ICD-10-CM | POA: Diagnosis not present

## 2016-04-17 ENCOUNTER — Other Ambulatory Visit: Payer: Self-pay

## 2016-04-21 ENCOUNTER — Other Ambulatory Visit: Payer: Self-pay | Admitting: Family Medicine

## 2016-04-21 DIAGNOSIS — I10 Essential (primary) hypertension: Secondary | ICD-10-CM

## 2016-04-28 ENCOUNTER — Encounter: Payer: Self-pay | Admitting: Family Medicine

## 2016-04-28 ENCOUNTER — Ambulatory Visit (INDEPENDENT_AMBULATORY_CARE_PROVIDER_SITE_OTHER): Payer: Medicare Other | Admitting: Family Medicine

## 2016-04-28 VITALS — BP 120/72 | HR 78 | Ht 62.0 in | Wt 135.0 lb

## 2016-04-28 DIAGNOSIS — E538 Deficiency of other specified B group vitamins: Secondary | ICD-10-CM | POA: Diagnosis not present

## 2016-04-28 DIAGNOSIS — I10 Essential (primary) hypertension: Secondary | ICD-10-CM

## 2016-04-28 DIAGNOSIS — Z23 Encounter for immunization: Secondary | ICD-10-CM

## 2016-04-28 DIAGNOSIS — E039 Hypothyroidism, unspecified: Secondary | ICD-10-CM | POA: Diagnosis not present

## 2016-04-28 DIAGNOSIS — E785 Hyperlipidemia, unspecified: Secondary | ICD-10-CM

## 2016-04-28 MED ORDER — LEVOTHYROXINE SODIUM 50 MCG PO TABS: 50.0000 ug | ORAL_TABLET | Freq: Every day | ORAL | 6 refills | Status: DC

## 2016-04-28 MED ORDER — LOSARTAN POTASSIUM-HCTZ 50-12.5 MG PO TABS: 1.0000 | ORAL_TABLET | Freq: Every day | ORAL | 6 refills | Status: DC

## 2016-04-28 MED ORDER — SIMVASTATIN 20 MG PO TABS: 20.0000 mg | ORAL_TABLET | Freq: Every day | ORAL | 6 refills | Status: DC

## 2016-04-28 MED ORDER — LABETALOL HCL 300 MG PO TABS: 300.0000 mg | ORAL_TABLET | Freq: Every day | ORAL | 0 refills | Status: DC

## 2016-04-28 MED ORDER — CYANOCOBALAMIN 1000 MCG/ML IJ SOLN: 1000.0000 ug | INTRAMUSCULAR | 11 refills | Status: DC

## 2016-04-28 NOTE — Progress Notes (Signed)
Name: Carly Marshall   MRN: [id number]    DOB: 08-16-1935   Date:04/28/2016       Progress Note  Subjective  Chief Complaint  Chief Complaint  Patient presents with  . b12 def    needs refill on large vial- daughter gives inj at home  . Hypertension  . Hyperlipidemia  . Hypothyroidism    Hypertension  This is a chronic problem. The current episode started more than 1 year ago. The problem has been gradually improving since onset. The problem is controlled. Pertinent negatives include no anxiety, blurred vision, chest pain, headaches, malaise/fatigue, neck pain, orthopnea, palpitations, peripheral edema, PND, shortness of breath or sweats. There are no associated agents to hypertension. There are no known risk factors for coronary artery disease. Past treatments include angiotensin blockers, beta blockers and diuretics. The current treatment provides mild improvement. There are no compliance problems.  Hypertensive end-organ damage includes a thyroid problem. There is no history of angina, kidney disease, CAD/MI, CVA, heart failure, left ventricular hypertrophy, PVD, renovascular disease or retinopathy. There is no history of chronic renal disease or a hypertension causing med.  Hyperlipidemia  This is a chronic problem. The current episode started more than 1 year ago. The problem is controlled. Recent lipid tests were reviewed and are normal. She has no history of chronic renal disease, diabetes, hypothyroidism, liver disease, obesity or nephrotic syndrome. Pertinent negatives include no chest pain, focal sensory loss, focal weakness, leg pain, myalgias or shortness of breath. Current antihyperlipidemic treatment includes statins. The current treatment provides mild improvement of lipids. There are no compliance problems.  Risk factors for coronary artery disease include hypertension, dyslipidemia and post-menopausal.  Thyroid Problem  Presents for follow-up visit. Symptoms include weight loss.  Patient reports no anxiety, cold intolerance, constipation, depressed mood, diaphoresis, diarrhea, dry skin, fatigue, hair loss, heat intolerance, hoarse voice, leg swelling, nail problem, palpitations, tremors, visual change or weight gain. The symptoms have been stable. Her past medical history is significant for hyperlipidemia. There is no history of diabetes or heart failure.    No problem-specific Assessment & Plan notes found for this encounter.   Past Medical History:  Diagnosis Date  . Allergy   . B12 deficiency anemia   . Hyperlipidemia   . Hypertension   . Thyroid disease     Past Surgical History:  Procedure Laterality Date  . COLONOSCOPY  2012   normal- Dr Sonny Masters  . ECTOPIC PREGNANCY SURGERY      No family history on file.  Social History   Social History  . Marital status: Married    Spouse name: N/A  . Number of children: N/A  . Years of education: N/A   Occupational History  . Not on file.   Social History Main Topics  . Smoking status: Never Smoker  . Smokeless tobacco: Not on file  . Alcohol use No  . Drug use: No  . Sexual activity: No   Other Topics Concern  . Not on file   Social History Narrative  . No narrative on file    No Known Allergies   Review of Systems  Constitutional: Positive for weight loss. Negative for chills, diaphoresis, fatigue, fever, malaise/fatigue and weight gain.       Weight loss by design  HENT: Positive for congestion. Negative for ear discharge, ear pain, hearing loss, hoarse voice, sore throat and tinnitus.   Eyes: Negative for blurred vision.  Respiratory: Negative for cough, sputum production, shortness of breath and  wheezing.   Cardiovascular: Negative for chest pain, palpitations, orthopnea, leg swelling and PND.  Gastrointestinal: Negative for abdominal pain, blood in stool, constipation, diarrhea, heartburn, melena and nausea.  Genitourinary: Negative for dysuria, frequency, hematuria and urgency.   Musculoskeletal: Negative for back pain, joint pain, myalgias and neck pain.  Skin: Negative for rash.  Neurological: Positive for dizziness. Negative for tingling, tremors, sensory change, focal weakness and headaches.  Endo/Heme/Allergies: Negative for environmental allergies, cold intolerance, heat intolerance and polydipsia. Does not bruise/bleed easily.  Psychiatric/Behavioral: Negative for depression and suicidal ideas. The patient is not nervous/anxious and does not have insomnia.      Objective  Vitals:   04/28/16 1431  BP: 120/72  Pulse: 78  Weight: 135 lb (61.2 kg)  Height: 5\' 2"  (1.575 m)    Physical Exam  Constitutional: She is well-developed, well-nourished, and in no distress. No distress.  HENT:  Head: Normocephalic and atraumatic.  Right Ear: External ear normal.  Left Ear: External ear normal.  Nose: Nose normal.  Mouth/Throat: Oropharynx is clear and moist.  Eyes: Conjunctivae and EOM are normal. Pupils are equal, round, and reactive to light. Right eye exhibits no discharge. Left eye exhibits no discharge.  Neck: Normal range of motion. Neck supple. No JVD present. No thyromegaly present.  Cardiovascular: Normal rate, regular rhythm, normal heart sounds and intact distal pulses.  Exam reveals no gallop and no friction rub.   No murmur heard. Pulmonary/Chest: Effort normal and breath sounds normal. She has no wheezes. She has no rales.  Abdominal: Soft. Bowel sounds are normal. She exhibits no mass. There is no hepatosplenomegaly. There is no tenderness. There is no guarding and no CVA tenderness.  Musculoskeletal: Normal range of motion. She exhibits no edema.  Lymphadenopathy:    She has no cervical adenopathy.  Neurological: She is alert.  Skin: Skin is warm and dry. She is not diaphoretic.  Psychiatric: Mood and affect normal.  Nursing note and vitals reviewed.     Assessment & Plan  Problem List Items Addressed This Visit      Cardiovascular and  Mediastinum   Essential hypertension - Primary   Relevant Medications   labetalol (NORMODYNE) 300 MG tablet   losartan-hydrochlorothiazide (HYZAAR) 50-12.5 MG tablet   simvastatin (ZOCOR) 20 MG tablet   Other Relevant Orders   Renal Function Panel     Digestive   B12 deficiency   Relevant Medications   cyanocobalamin (,VITAMIN B-12,) 1000 MCG/ML injection     Endocrine   Thyroid activity decreased   Relevant Medications   labetalol (NORMODYNE) 300 MG tablet   levothyroxine (SYNTHROID, LEVOTHROID) 50 MCG tablet   Other Relevant Orders   TSH     Other   Hyperlipidemia   Relevant Medications   labetalol (NORMODYNE) 300 MG tablet   losartan-hydrochlorothiazide (HYZAAR) 50-12.5 MG tablet   simvastatin (ZOCOR) 20 MG tablet   Other Relevant Orders   Lipid Profile    Other Visit Diagnoses    Flu vaccine need       Relevant Orders   Flu Vaccine QUAD 36+ mos PF IM (Fluarix & Fluzone Quad PF) (Completed)        Dr. [facility]  04/28/16

## 2016-04-29 LAB — RENAL FUNCTION PANEL
Albumin: 4.5 g/dL (ref 3.5–4.8)
BUN/Creatinine Ratio: 18 (ref 12–28)
BUN: 21 mg/dL (ref 8–27)
CO2: 27 mmol/L (ref 18–29)
Calcium: 9.7 mg/dL (ref 8.7–10.3)
Chloride: 93 mmol/L — ABNORMAL LOW (ref 96–106)
Creatinine, Ser: 1.18 mg/dL — ABNORMAL HIGH (ref 0.57–1.00)
GFR calc Af Amer: 51 mL/min/{1.73_m2} — ABNORMAL LOW (ref >59)
GFR calc non Af Amer: 45 mL/min/{1.73_m2} — ABNORMAL LOW (ref >59)
Glucose: 86 mg/dL (ref 65–99)
Phosphorus: 3.4 mg/dL (ref 2.5–4.5)
Potassium: 4.1 mmol/L (ref 3.5–5.2)
Sodium: 136 mmol/L (ref 134–144)

## 2016-04-29 LAB — LIPID PANEL
Chol/HDL Ratio: 2.1 ratio units (ref 0.0–4.4)
Cholesterol, Total: 124 mg/dL (ref 100–199)
HDL: 60 mg/dL (ref >39)
LDL Calculated: 49 mg/dL (ref 0–99)
Triglycerides: 75 mg/dL (ref 0–149)
VLDL Cholesterol Cal: 15 mg/dL (ref 5–40)

## 2016-04-29 LAB — TSH: TSH: 1.87 u[IU]/mL (ref 0.450–4.500)

## 2016-05-24 ENCOUNTER — Other Ambulatory Visit: Payer: Self-pay | Admitting: Family Medicine

## 2016-05-24 DIAGNOSIS — I10 Essential (primary) hypertension: Secondary | ICD-10-CM

## 2016-06-10 ENCOUNTER — Other Ambulatory Visit: Payer: Self-pay | Admitting: Family Medicine

## 2016-06-10 DIAGNOSIS — E039 Hypothyroidism, unspecified: Secondary | ICD-10-CM

## 2016-06-24 ENCOUNTER — Other Ambulatory Visit: Payer: Self-pay | Admitting: Family Medicine

## 2016-06-24 DIAGNOSIS — I10 Essential (primary) hypertension: Secondary | ICD-10-CM

## 2016-08-12 ENCOUNTER — Ambulatory Visit
Admission: RE | Admit: 2016-08-12 | Discharge: 2016-08-12 | Disposition: A | Discharge status: Home / Self Care | Payer: Medicare Other | Source: Ambulatory Visit | Attending: Family Medicine | Admitting: Family Medicine

## 2016-08-12 ENCOUNTER — Ambulatory Visit (INDEPENDENT_AMBULATORY_CARE_PROVIDER_SITE_OTHER): Payer: Medicare Other | Admitting: Family Medicine

## 2016-08-12 ENCOUNTER — Encounter: Payer: Self-pay | Admitting: Family Medicine

## 2016-08-12 VITALS — BP 120/64 | HR 72 | Temp 103.0°F | Resp 98 | Ht 62.0 in | Wt 134.0 lb

## 2016-08-12 DIAGNOSIS — J181 Lobar pneumonia, unspecified organism: Secondary | ICD-10-CM

## 2016-08-12 DIAGNOSIS — J189 Pneumonia, unspecified organism: Secondary | ICD-10-CM

## 2016-08-12 DIAGNOSIS — R05 Cough: Secondary | ICD-10-CM | POA: Diagnosis not present

## 2016-08-12 LAB — CBC
HCT: 36.8 % (ref 35.0–47.0)
Hemoglobin: 12.4 g/dL (ref 12.0–16.0)
MCH: 29.9 pg (ref 26.0–34.0)
MCHC: 33.6 g/dL (ref 32.0–36.0)
MCV: 89 fL (ref 80.0–100.0)
Platelets: 168 10*3/uL (ref 150–440)
RBC: 4.14 MIL/uL (ref 3.80–5.20)
RDW: 13.2 % (ref 11.5–14.5)
WBC: 7.2 10*3/uL (ref 3.6–11.0)

## 2016-08-12 MED ORDER — LEVOFLOXACIN 750 MG PO TABS: 750.0000 mg | ORAL_TABLET | Freq: Every day | ORAL | 0 refills | Status: DC

## 2016-08-12 NOTE — Progress Notes (Signed)
Name: Carly Marshall   MRN: [id number]    DOB: 11/13/1935   Date:08/12/2016       Progress Note  Subjective  Chief Complaint  Chief Complaint  Patient presents with  . Sinusitis    sneezing, sinus headache, aches and pains, "bit of blood coming out of nose"- been taking sinus pills otc    Sinusitis  This is a new problem. The current episode started in the past 7 days. The problem has been waxing and waning since onset. The maximum temperature recorded prior to her arrival was 103 - 104 F. The fever has been present for 5 days or more. The pain is moderate. Associated symptoms include chills, congestion, coughing, diaphoresis and shortness of breath. Pertinent negatives include no ear pain, headaches, hoarse voice, neck pain, sinus pressure, sneezing, sore throat or swollen glands. Past treatments include acetaminophen and oral decongestants. The treatment provided mild relief.  Fever   This is a new problem. The current episode started in the past 7 days. The problem occurs intermittently. The problem has been waxing and waning. The maximum temperature noted was 103 to 103.9 F. The temperature was taken using an oral thermometer. Associated symptoms include congestion, coughing and muscle aches. Pertinent negatives include no abdominal pain, chest pain, diarrhea, ear pain, headaches, nausea, rash, sore throat, urinary pain or wheezing. She has tried acetaminophen for the symptoms. The treatment provided no relief.    No problem-specific Assessment & Plan notes found for this encounter.   Past Medical History:  Diagnosis Date  . Allergy   . B12 deficiency anemia   . Hyperlipidemia   . Hypertension   . Thyroid disease     Past Surgical History:  Procedure Laterality Date  . COLONOSCOPY  2012   normal- Dr Sonny Masters  . ECTOPIC PREGNANCY SURGERY      No family history on file.  Social History   Social History  . Marital status: Married    Spouse name: N/A  . Number of children:  N/A  . Years of education: N/A   Occupational History  . Not on file.   Social History Main Topics  . Smoking status: Never Smoker  . Smokeless tobacco: Not on file  . Alcohol use No  . Drug use: No  . Sexual activity: No   Other Topics Concern  . Not on file   Social History Narrative  . No narrative on file    No Known Allergies   Review of Systems  Constitutional: Positive for chills, diaphoresis and fever. Negative for malaise/fatigue and weight loss.  HENT: Positive for congestion. Negative for ear discharge, ear pain, hoarse voice, sinus pressure, sneezing and sore throat.   Eyes: Negative for blurred vision.  Respiratory: Positive for cough and shortness of breath. Negative for sputum production and wheezing.   Cardiovascular: Negative for chest pain, palpitations and leg swelling.  Gastrointestinal: Negative for abdominal pain, blood in stool, constipation, diarrhea, heartburn, melena and nausea.  Genitourinary: Negative for dysuria, frequency, hematuria and urgency.  Musculoskeletal: Negative for back pain, joint pain, myalgias and neck pain.  Skin: Negative for rash.  Neurological: Negative for dizziness, tingling, sensory change, focal weakness and headaches.  Endo/Heme/Allergies: Negative for environmental allergies and polydipsia. Does not bruise/bleed easily.  Psychiatric/Behavioral: Negative for depression and suicidal ideas. The patient is not nervous/anxious and does not have insomnia.      Objective  Vitals:   08/12/16 1355  BP: 120/64  Pulse: 72  Resp: (!) 98  Temp: (!) 103 F (39.4 C)  TempSrc: Oral  Weight: 134 lb (60.8 kg)  Height: 5\' 2"  (1.575 m)    Physical Exam  Constitutional: She is well-developed, well-nourished, and in no distress. No distress.  HENT:  Head: Normocephalic and atraumatic.  Right Ear: External ear normal.  Left Ear: External ear normal.  Nose: Nose normal.  Mouth/Throat: Oropharynx is clear and moist.  Eyes:  Conjunctivae and EOM are normal. Pupils are equal, round, and reactive to light. Right eye exhibits no discharge. Left eye exhibits no discharge.  Neck: Normal range of motion. Neck supple. No JVD present. No thyromegaly present.  Cardiovascular: Normal rate, regular rhythm, normal heart sounds and intact distal pulses.  Exam reveals no gallop and no friction rub.   No murmur heard. Pulmonary/Chest: Effort normal. She has decreased breath sounds in the right lower field. She has no wheezes. She has no rhonchi. She has no rales.      Rub/RLL  Abdominal: Soft. Bowel sounds are normal. She exhibits no mass. There is no tenderness. There is no guarding.  Musculoskeletal: Normal range of motion. She exhibits no edema.  Lymphadenopathy:    She has no cervical adenopathy.  Neurological: She is alert. She has normal reflexes.  Skin: Skin is warm and dry. She is not diaphoretic.  Psychiatric: Mood and affect normal.  Nursing note and vitals reviewed.     Assessment & Plan  Problem List Items Addressed This Visit    None    Visit Diagnoses    Pneumonia of right lower lobe due to infectious organism ([place])    -  Primary   cbc   Relevant Medications   levofloxacin (LEVAQUIN) 750 MG tablet   Other Relevant Orders   DG Chest 2 View (Completed)        Dr. [facility]  08/12/16

## 2016-08-14 ENCOUNTER — Ambulatory Visit: Payer: Self-pay | Admitting: Family Medicine

## 2016-08-15 ENCOUNTER — Encounter: Payer: Self-pay | Admitting: Family Medicine

## 2016-08-15 ENCOUNTER — Ambulatory Visit (INDEPENDENT_AMBULATORY_CARE_PROVIDER_SITE_OTHER): Payer: Medicare Other | Admitting: Family Medicine

## 2016-08-15 VITALS — BP 122/70 | HR 62 | Temp 98.7°F | Ht 62.0 in | Wt 131.0 lb

## 2016-08-15 DIAGNOSIS — J181 Lobar pneumonia, unspecified organism: Secondary | ICD-10-CM

## 2016-08-15 DIAGNOSIS — J189 Pneumonia, unspecified organism: Secondary | ICD-10-CM

## 2016-08-15 NOTE — Progress Notes (Signed)
Name: Carly Marshall   MRN: [id number]    DOB: 01/28/1936   Date:08/15/2016       Progress Note  Subjective  Chief Complaint  Chief Complaint  Patient presents with  . Follow-up    pneumonia- feels better- on day 3 of Levaquin    Cough  The current episode started in the past 7 days. The problem has been gradually improving. The problem occurs hourly. The cough is non-productive. Pertinent negatives include no chest pain, chills, ear pain, fever, headaches, heartburn, hemoptysis, myalgias, nasal congestion, postnasal drip, rash, sore throat, shortness of breath, weight loss or wheezing. Nothing aggravates the symptoms. She has tried a beta-agonist inhaler (antibiotic/levaquin) for the symptoms. The treatment provided moderate relief. There is no history of environmental allergies.    No problem-specific Assessment & Plan notes found for this encounter.   Past Medical History:  Diagnosis Date  . Allergy   . B12 deficiency anemia   . Hyperlipidemia   . Hypertension   . Thyroid disease     Past Surgical History:  Procedure Laterality Date  . COLONOSCOPY  2012   normal- Dr Sonny Masters  . ECTOPIC PREGNANCY SURGERY      History reviewed. No pertinent family history.  Social History   Social History  . Marital status: Married    Spouse name: N/A  . Number of children: N/A  . Years of education: N/A   Occupational History  . Not on file.   Social History Main Topics  . Smoking status: Never Smoker  . Smokeless tobacco: Not on file  . Alcohol use No  . Drug use: No  . Sexual activity: No   Other Topics Concern  . Not on file   Social History Narrative  . No narrative on file    No Known Allergies   Review of Systems  Constitutional: Negative for chills, fever, malaise/fatigue and weight loss.  HENT: Negative for ear discharge, ear pain, postnasal drip and sore throat.   Eyes: Negative for blurred vision.  Respiratory: Positive for cough. Negative for hemoptysis,  sputum production, shortness of breath and wheezing.   Cardiovascular: Negative for chest pain, palpitations and leg swelling.  Gastrointestinal: Negative for abdominal pain, blood in stool, constipation, diarrhea, heartburn, melena and nausea.  Genitourinary: Negative for dysuria, frequency, hematuria and urgency.  Musculoskeletal: Negative for back pain, joint pain, myalgias and neck pain.  Skin: Negative for rash.  Neurological: Negative for dizziness, tingling, sensory change, focal weakness and headaches.  Endo/Heme/Allergies: Negative for environmental allergies and polydipsia. Does not bruise/bleed easily.  Psychiatric/Behavioral: Negative for depression and suicidal ideas. The patient is not nervous/anxious and does not have insomnia.      Objective  Vitals:   08/15/16 1359  BP: 122/70  Pulse: 62  Temp: 98.7 F (37.1 C)  SpO2: 99%  Weight: 131 lb (59.4 kg)  Height: 5\' 2"  (1.575 m)    Physical Exam  Constitutional: She is well-developed, well-nourished, and in no distress. No distress.  HENT:  Head: Normocephalic and atraumatic.  Right Ear: External ear normal.  Left Ear: External ear normal.  Nose: Nose normal.  Mouth/Throat: Oropharynx is clear and moist.  Eyes: Conjunctivae and EOM are normal. Pupils are equal, round, and reactive to light. Right eye exhibits no discharge. Left eye exhibits no discharge.  Neck: Normal range of motion. Neck supple. No JVD present. No thyromegaly present.  Cardiovascular: Normal rate, regular rhythm, normal heart sounds and intact distal pulses.  Exam reveals no gallop  and no friction rub.   No murmur heard. Pulmonary/Chest: Effort normal and breath sounds normal. She has no wheezes. She has no rales.  Abdominal: Soft. Bowel sounds are normal. She exhibits no mass. There is no tenderness. There is no guarding.  Musculoskeletal: Normal range of motion. She exhibits no edema.  Lymphadenopathy:    She has no cervical adenopathy.   Neurological: She is alert. She has normal reflexes.  Skin: Skin is warm and dry. She is not diaphoretic.  Psychiatric: Mood and affect normal.  Nursing note and vitals reviewed.     Assessment & Plan  Problem List Items Addressed This Visit    None    Visit Diagnoses    Pneumonia of right lower lobe due to infectious organism [facility])    -  Primary   continue med     I spent 15 minutes with this patient, More than 50% of that time was spent in face to face education, counseling and care coordination.    Dr. [facility]  08/15/16

## 2016-09-03 DIAGNOSIS — H40013 Open angle with borderline findings, low risk, bilateral: Secondary | ICD-10-CM | POA: Diagnosis not present

## 2016-09-03 DIAGNOSIS — H35033 Hypertensive retinopathy, bilateral: Secondary | ICD-10-CM | POA: Diagnosis not present

## 2016-09-18 ENCOUNTER — Other Ambulatory Visit: Payer: Self-pay | Admitting: Family Medicine

## 2016-09-18 DIAGNOSIS — I10 Essential (primary) hypertension: Secondary | ICD-10-CM

## 2016-11-07 ENCOUNTER — Other Ambulatory Visit: Payer: Self-pay | Admitting: Family Medicine

## 2016-11-07 DIAGNOSIS — E039 Hypothyroidism, unspecified: Secondary | ICD-10-CM

## 2016-11-13 ENCOUNTER — Other Ambulatory Visit: Payer: Self-pay | Admitting: Family Medicine

## 2016-11-13 DIAGNOSIS — I10 Essential (primary) hypertension: Secondary | ICD-10-CM

## 2016-11-17 ENCOUNTER — Other Ambulatory Visit: Payer: Self-pay | Admitting: Family Medicine

## 2016-11-17 DIAGNOSIS — I10 Essential (primary) hypertension: Secondary | ICD-10-CM

## 2016-11-20 ENCOUNTER — Ambulatory Visit (INDEPENDENT_AMBULATORY_CARE_PROVIDER_SITE_OTHER): Payer: Medicare Other | Admitting: Family Medicine

## 2016-11-20 VITALS — BP 120/70 | HR 60 | Temp 98.7°F | Ht 62.0 in | Wt 136.0 lb

## 2016-11-20 DIAGNOSIS — J0100 Acute maxillary sinusitis, unspecified: Secondary | ICD-10-CM

## 2016-11-20 DIAGNOSIS — J40 Bronchitis, not specified as acute or chronic: Secondary | ICD-10-CM

## 2016-11-20 MED ORDER — GUAIFENESIN-CODEINE 100-10 MG/5ML PO SYRP: 5.0000 mL | ORAL_SOLUTION | Freq: Three times a day (TID) | ORAL | 0 refills | Status: DC | PRN

## 2016-11-20 MED ORDER — LEVOFLOXACIN 750 MG PO TABS: 750.0000 mg | ORAL_TABLET | Freq: Every day | ORAL | 0 refills | Status: DC

## 2016-11-20 NOTE — Progress Notes (Signed)
Name: Carly Marshall   MRN: [id number]    DOB: 1936-06-01   Date:11/20/2016       Progress Note  Subjective  Chief Complaint  Chief Complaint  Patient presents with  . Sinusitis    cough and cong with yellow production- finished Levaquin in Jan.    Sinusitis  This is a recurrent problem. The current episode started in the past 7 days. The problem has been gradually worsening since onset. There has been no fever. Associated symptoms include congestion, coughing, sinus pressure and sneezing. Pertinent negatives include no chills, diaphoresis, ear pain, headaches, hoarse voice, neck pain, shortness of breath, sore throat or swollen glands. Past treatments include acetaminophen. The treatment provided moderate relief.    No problem-specific Assessment & Plan notes found for this encounter.   Past Medical History:  Diagnosis Date  . Allergy   . B12 deficiency anemia   . Hyperlipidemia   . Hypertension   . Thyroid disease     Past Surgical History:  Procedure Laterality Date  . COLONOSCOPY  2012   normal- Dr Sonny Masters  . ECTOPIC PREGNANCY SURGERY      No family history on file.  Social History   Social History  . Marital status: Married    Spouse name: N/A  . Number of children: N/A  . Years of education: N/A   Occupational History  . Not on file.   Social History Main Topics  . Smoking status: Never Smoker  . Smokeless tobacco: Not on file  . Alcohol use No  . Drug use: No  . Sexual activity: No   Other Topics Concern  . Not on file   Social History Narrative  . No narrative on file    No Known Allergies  Outpatient Medications Prior to Visit  Medication Sig Dispense Refill  . cyanocobalamin (,VITAMIN B-12,) 1000 MCG/ML injection Inject 1 mL (1,000 mcg total) into the muscle every 30 (thirty) days. Give at home 1 mL 11  . labetalol (NORMODYNE) 300 MG tablet TAKE 1 TABLET BY MOUTH EVERY DAY. 30 tablet 0  . levothyroxine (SYNTHROID, LEVOTHROID) 50 MCG tablet  TAKE 1 TABLET(50 MCG) BY MOUTH DAILY 90 tablet 0  . losartan-hydrochlorothiazide (HYZAAR) 50-12.5 MG tablet TAKE 1 TABLET BY MOUTH DAILY 30 tablet 0  . simvastatin (ZOCOR) 20 MG tablet Take 1 tablet (20 mg total) by mouth daily. 30 tablet 6  . labetalol (NORMODYNE) 300 MG tablet TAKE 1 TABLET BY MOUTH EVERY DAY 90 tablet 0  . levofloxacin (LEVAQUIN) 750 MG tablet Take 1 tablet (750 mg total) by mouth daily. 5 tablet 0   No facility-administered medications prior to visit.     Review of Systems  Constitutional: Negative for chills, diaphoresis, fever, malaise/fatigue and weight loss.  HENT: Positive for congestion, sinus pressure and sneezing. Negative for ear discharge, ear pain, hoarse voice, nosebleeds, sinus pain, sore throat and tinnitus.   Eyes: Negative for blurred vision.  Respiratory: Positive for cough. Negative for sputum production, shortness of breath, wheezing and stridor.   Cardiovascular: Negative for chest pain, palpitations and leg swelling.  Gastrointestinal: Negative for abdominal pain, blood in stool, constipation, diarrhea, heartburn, melena and nausea.  Genitourinary: Negative for dysuria, frequency, hematuria and urgency.  Musculoskeletal: Negative for back pain, joint pain, myalgias and neck pain.  Skin: Negative for rash.  Neurological: Negative for dizziness, tingling, sensory change, focal weakness and headaches.  Endo/Heme/Allergies: Negative for environmental allergies and polydipsia. Does not bruise/bleed easily.  Psychiatric/Behavioral: Negative for depression  and suicidal ideas. The patient is not nervous/anxious and does not have insomnia.      Objective  Vitals:   11/20/16 1357  BP: 120/70  Pulse: 60  Temp: 98.7 F (37.1 C)  TempSrc: Oral  Weight: 136 lb (61.7 kg)  Height: 5\' 2"  (1.575 m)    Physical Exam  Constitutional: She is well-developed, well-nourished, and in no distress. No distress.  HENT:  Head: Normocephalic and atraumatic.   Right Ear: External ear normal.  Left Ear: External ear normal.  Nose: Nose normal.  Mouth/Throat: Oropharynx is clear and moist.  Eyes: Conjunctivae and EOM are normal. Pupils are equal, round, and reactive to light. Right eye exhibits no discharge. Left eye exhibits no discharge.  Neck: Normal range of motion. Neck supple. No JVD present. No thyromegaly present.  Cardiovascular: Normal rate, regular rhythm, normal heart sounds and intact distal pulses.  Exam reveals no gallop and no friction rub.   No murmur heard. Pulmonary/Chest: Effort normal and breath sounds normal.  Abdominal: Soft. Bowel sounds are normal. She exhibits no mass. There is no tenderness. There is no guarding.  Musculoskeletal: Normal range of motion. She exhibits no edema.  Lymphadenopathy:    She has no cervical adenopathy.  Neurological: She is alert. She has normal reflexes.  Skin: Skin is warm and dry. She is not diaphoretic.  Psychiatric: Mood and affect normal.  Nursing note and vitals reviewed.     Assessment & Plan  Problem List Items Addressed This Visit    None    Visit Diagnoses    Acute maxillary sinusitis, recurrence not specified    -  Primary   Relevant Medications   levofloxacin (LEVAQUIN) 750 MG tablet   guaiFENesin-codeine (ROBITUSSIN AC) 100-10 MG/5ML syrup   Bronchitis       Relevant Medications   guaiFENesin-codeine (ROBITUSSIN AC) 100-10 MG/5ML syrup      Meds ordered this encounter  Medications  . levofloxacin (LEVAQUIN) 750 MG tablet    Sig: Take 1 tablet (750 mg total) by mouth daily.    Dispense:  5 tablet    Refill:  0  . guaiFENesin-codeine (ROBITUSSIN AC) 100-10 MG/5ML syrup    Sig: Take 5 mLs by mouth 3 (three) times daily as needed for cough.    Dispense:  150 mL    Refill:  0      Dr. Otilio Miu [facility]  11/20/16

## 2016-11-25 ENCOUNTER — Other Ambulatory Visit: Payer: Self-pay | Admitting: Family Medicine

## 2016-11-25 DIAGNOSIS — E785 Hyperlipidemia, unspecified: Secondary | ICD-10-CM

## 2016-12-17 ENCOUNTER — Other Ambulatory Visit: Payer: Self-pay | Admitting: Family Medicine

## 2016-12-17 DIAGNOSIS — I10 Essential (primary) hypertension: Secondary | ICD-10-CM

## 2016-12-21 ENCOUNTER — Other Ambulatory Visit: Payer: Self-pay | Admitting: Family Medicine

## 2016-12-21 DIAGNOSIS — I10 Essential (primary) hypertension: Secondary | ICD-10-CM

## 2017-02-06 ENCOUNTER — Other Ambulatory Visit: Payer: Self-pay | Admitting: Family Medicine

## 2017-02-06 DIAGNOSIS — E039 Hypothyroidism, unspecified: Secondary | ICD-10-CM

## 2017-02-07 ENCOUNTER — Other Ambulatory Visit: Payer: Self-pay | Admitting: Family Medicine

## 2017-02-07 DIAGNOSIS — I10 Essential (primary) hypertension: Secondary | ICD-10-CM

## 2017-02-16 ENCOUNTER — Ambulatory Visit: Payer: Self-pay

## 2017-03-02 ENCOUNTER — Ambulatory Visit (INDEPENDENT_AMBULATORY_CARE_PROVIDER_SITE_OTHER): Payer: Medicare Other

## 2017-03-02 VITALS — BP 142/78 | HR 62 | Temp 98.4°F | Resp 16 | Ht 62.0 in | Wt 136.8 lb

## 2017-03-02 DIAGNOSIS — Z0000 Encounter for general adult medical examination without abnormal findings: Secondary | ICD-10-CM | POA: Diagnosis not present

## 2017-03-02 NOTE — Patient Instructions (Signed)
Carly Marshall , Thank you for taking time to come for your Medicare Wellness Visit. I appreciate your ongoing commitment to your health goals. Please review the following plan we discussed and let me know if I can assist you in the future.   Screening recommendations/referrals: Colonoscopy: completed 05/14/2010, no longer required Mammogram: completed 01/14/2012, no longer required Bone Density: completed 02/09/2013 Recommended yearly ophthalmology/optometry visit for glaucoma screening and checkup Recommended yearly dental visit for hygiene and checkup  Vaccinations: Influenza vaccine: up to date, due 04/2017 Pneumococcal vaccine:  Tdap vaccine: up to date Shingles vaccine: due, check with your insurance company for coverage  Advanced directives: Advance directive discussed with you today. I have provided a copy for you to complete at home and have notarized. Once this is complete please bring a copy in to our office so we can scan it into your chart.  Conditions/risks identified:Recommend drinking at least 4-5 glasses of water a day  Next appointment: Follow up in one year for your annual wellness exam.    Preventive Care 79 Years and Older, Female Preventive care refers to lifestyle choices and visits with your health care provider that can promote health and wellness. What does preventive care include?  A yearly physical exam. This is also called an annual well check.  Dental exams once or twice a year.  Routine eye exams. Ask your health care provider how often you should have your eyes checked.  Personal lifestyle choices, including:  Daily care of your teeth and gums.  Regular physical activity.  Eating a healthy diet.  Avoiding tobacco and drug use.  Limiting alcohol use.  Practicing safe sex.  Taking low-dose aspirin every day.  Taking vitamin and mineral supplements as recommended by your health care provider. What happens during an annual well check? The services  and screenings done by your health care provider during your annual well check will depend on your age, overall health, lifestyle risk factors, and family history of disease. Counseling  Your health care provider may ask you questions about your:  Alcohol use.  Tobacco use.  Drug use.  Emotional well-being.  Home and relationship well-being.  Sexual activity.  Eating habits.  History of falls.  Memory and ability to understand (cognition).  Work and work Statistician.  Reproductive health. Screening  You may have the following tests or measurements:  Height, weight, and BMI.  Blood pressure.  Lipid and cholesterol levels. These may be checked every 5 years, or more frequently if you are over 24 years old.  Skin check.  Lung cancer screening. You may have this screening every year starting at age 79 if you have a 30-pack-year history of smoking and currently smoke or have quit within the past 15 years.  Fecal occult blood test (FOBT) of the stool. You may have this test every year starting at age 79.  Flexible sigmoidoscopy or colonoscopy. You may have a sigmoidoscopy every 5 years or a colonoscopy every 10 years starting at age 79.  Hepatitis C blood test.  Hepatitis B blood test.  Sexually transmitted disease (STD) testing.  Diabetes screening. This is done by checking your blood sugar (glucose) after you have not eaten for a while (fasting). You may have this done every 1-3 years.  Bone density scan. This is done to screen for osteoporosis. You may have this done starting at age 79.  Mammogram. This may be done every 1-2 years. Talk to your health care provider about how often you should have  regular mammograms. Talk with your health care provider about your test results, treatment options, and if necessary, the need for more tests. Vaccines  Your health care provider may recommend certain vaccines, such as:  Influenza vaccine. This is recommended every  year.  Tetanus, diphtheria, and acellular pertussis (Tdap, Td) vaccine. You may need a Td booster every 10 years.  Zoster vaccine. You may need this after age 79.  Pneumococcal 13-valent conjugate (PCV13) vaccine. One dose is recommended after age 26.  Pneumococcal polysaccharide (PPSV23) vaccine. One dose is recommended after age 79. Talk to your health care provider about which screenings and vaccines you need and how often you need them. This information is not intended to replace advice given to you by your health care provider. Make sure you discuss any questions you have with your health care provider. Document Released: 08/24/2015 Document Revised: 04/16/2016 Document Reviewed: 05/29/2015 Elsevier Interactive Patient Education  2017 [place] Prevention in the Home Falls can cause injuries. They can happen to people of all ages. There are many things you can do to make your home safe and to help prevent falls. What can I do on the outside of my home?  Regularly fix the edges of walkways and driveways and fix any cracks.  Remove anything that might make you trip as you walk through a door, such as a raised step or threshold.  Trim any bushes or trees on the path to your home.  Use bright outdoor lighting.  Clear any walking paths of anything that might make someone trip, such as rocks or tools.  Regularly check to see if handrails are loose or broken. Make sure that both sides of any steps have handrails.  Any raised decks and porches should have guardrails on the edges.  Have any leaves, snow, or ice cleared regularly.  Use sand or salt on walking paths during winter.  Clean up any spills in your garage right away. This includes oil or grease spills. What can I do in the bathroom?  Use night lights.  Install grab bars by the toilet and in the tub and shower. Do not use towel bars as grab bars.  Use non-skid mats or decals in the tub or shower.  If you  need to sit down in the shower, use a plastic, non-slip stool.  Keep the floor dry. Clean up any water that spills on the floor as soon as it happens.  Remove soap buildup in the tub or shower regularly.  Attach bath mats securely with double-sided non-slip rug tape.  Do not have throw rugs and other things on the floor that can make you trip. What can I do in the bedroom?  Use night lights.  Make sure that you have a light by your bed that is easy to reach.  Do not use any sheets or blankets that are too big for your bed. They should not hang down onto the floor.  Have a firm chair that has side arms. You can use this for support while you get dressed.  Do not have throw rugs and other things on the floor that can make you trip. What can I do in the kitchen?  Clean up any spills right away.  Avoid walking on wet floors.  Keep items that you use a lot in easy-to-reach places.  If you need to reach something above you, use a strong step stool that has a grab bar.  Keep electrical cords out of the  way.  Do not use floor polish or wax that makes floors slippery. If you must use wax, use non-skid floor wax.  Do not have throw rugs and other things on the floor that can make you trip. What can I do with my stairs?  Do not leave any items on the stairs.  Make sure that there are handrails on both sides of the stairs and use them. Fix handrails that are broken or loose. Make sure that handrails are as long as the stairways.  Check any carpeting to make sure that it is firmly attached to the stairs. Fix any carpet that is loose or worn.  Avoid having throw rugs at the top or bottom of the stairs. If you do have throw rugs, attach them to the floor with carpet tape.  Make sure that you have a light switch at the top of the stairs and the bottom of the stairs. If you do not have them, ask someone to add them for you. What else can I do to help prevent falls?  Wear shoes  that:  Do not have high heels.  Have rubber bottoms.  Are comfortable and fit you well.  Are closed at the toe. Do not wear sandals.  If you use a stepladder:  Make sure that it is fully opened. Do not climb a closed stepladder.  Make sure that both sides of the stepladder are locked into place.  Ask someone to hold it for you, if possible.  Clearly mark and make sure that you can see:  Any grab bars or handrails.  First and last steps.  Where the edge of each step is.  Use tools that help you move around (mobility aids) if they are needed. These include:  Canes.  Walkers.  Scooters.  Crutches.  Turn on the lights when you go into a dark area. Replace any light bulbs as soon as they burn out.  Set up your furniture so you have a clear path. Avoid moving your furniture around.  If any of your floors are uneven, fix them.  If there are any pets around you, be aware of where they are.  Review your medicines with your doctor. Some medicines can make you feel dizzy. This can increase your chance of falling. Ask your doctor what other things that you can do to help prevent falls. This information is not intended to replace advice given to you by your health care provider. Make sure you discuss any questions you have with your health care provider. Document Released: 05/24/2009 Document Revised: 01/03/2016 Document Reviewed: 09/01/2014 Elsevier Interactive Patient Education  2017 [employer].

## 2017-03-02 NOTE — Progress Notes (Signed)
Subjective:   Carly Marshall is a 81 y.o. female who presents for Medicare Annual (Subsequent) preventive examination.  Review of Systems:   Cardiac Risk Factors include: advanced age (>45men, >34 women);hypertension;dyslipidemia     Objective:     Vitals: BP (!) 142/78 (BP Location: Left Arm, Patient Position: Sitting)   Pulse 62   Temp 98.4 F (36.9 C)   Resp 16   Ht 5\' 2"  (1.575 m)   Wt 136 lb 12.8 oz (62.1 kg)   BMI 25.02 kg/m   Body mass index is 25.02 kg/m.   Tobacco History  Smoking Status  . Never Smoker  Smokeless Tobacco  . Never Used     Counseling given: Not Answered   Past Medical History:  Diagnosis Date  . Allergy   . B12 deficiency anemia   . Hyperlipidemia   . Hypertension   . Thyroid disease    Past Surgical History:  Procedure Laterality Date  . COLONOSCOPY  2012   normal- Dr Sonny Masters  . ECTOPIC PREGNANCY SURGERY     Family History  Problem Relation Age of Onset  . Diabetes Mother   . Heart disease Father   . Hyperlipidemia Father   . Hypertension Father   . Diabetes Sister   . Brain cancer Brother    History  Sexual Activity  . Sexual activity: No    Outpatient Encounter Prescriptions as of 03/02/2017  Medication Sig  . cyanocobalamin (,VITAMIN B-12,) 1000 MCG/ML injection Inject 1 mL (1,000 mcg total) into the muscle every 30 (thirty) days. Give at home  . labetalol (NORMODYNE) 300 MG tablet TAKE 1 TABLET BY MOUTH EVERY DAY.  Marland Kitchen levothyroxine (SYNTHROID, LEVOTHROID) 50 MCG tablet TAKE 1 TABLET(50 MCG) BY MOUTH DAILY  . losartan-hydrochlorothiazide (HYZAAR) 50-12.5 MG tablet TAKE 1 TABLET BY MOUTH DAILY.  . simvastatin (ZOCOR) 20 MG tablet Take 1 tablet (20 mg total) by mouth daily.  . [DISCONTINUED] guaiFENesin-codeine (ROBITUSSIN AC) 100-10 MG/5ML syrup Take 5 mLs by mouth 3 (three) times daily as needed for cough.  . [DISCONTINUED] labetalol (NORMODYNE) 300 MG tablet TAKE 1 TABLET BY MOUTH EVERY DAY  . [DISCONTINUED]  labetalol (NORMODYNE) 300 MG tablet TAKE 1 TABLET BY MOUTH EVERY DAY  . [DISCONTINUED] levofloxacin (LEVAQUIN) 750 MG tablet Take 1 tablet (750 mg total) by mouth daily. (Patient not taking: Reported on 03/02/2017)  . [DISCONTINUED] simvastatin (ZOCOR) 20 MG tablet TAKE 1 TABLET(20 MG) BY MOUTH DAILY   No facility-administered encounter medications on file as of 03/02/2017.     Activities of Daily Living In your present state of health, do you have any difficulty performing the following activities: 03/02/2017  Hearing? N  Vision? N  Difficulty concentrating or making decisions? N  Walking or climbing stairs? N  Dressing or bathing? N  Doing errands, shopping? N  Preparing Food and eating ? N  Using the Toilet? N  In the past six months, have you accidently leaked urine? N  Do you have problems with loss of bowel control? N  Managing your Medications? N  Managing your Finances? N  Housekeeping or managing your Housekeeping? N  Some recent data might be hidden    Patient Care Team: Juline Patch, MD as PCP - General (Family Medicine)    Assessment:     Exercise Activities and Dietary recommendations Current Exercise Habits: Home exercise routine, Type of exercise: stretching;walking, Time (Minutes): 30, Frequency (Times/Week): 6, Weekly Exercise (Minutes/Week): 180, Intensity: Mild  Goals    .  Increase water intake          Recommend drinking at least 4-5 glasses of water a day      Fall Risk Fall Risk  03/02/2017 11/20/2016 11/05/2015 09/27/2015 03/27/2015  Falls in the past year? Yes No No No No  Number falls in past yr: 1 - - - -  Injury with Fall? Yes - - - -  Follow up Falls prevention discussed - - - -   Depression Screen PHQ 2/9 Scores 03/02/2017 11/20/2016 11/05/2015 09/27/2015  PHQ - 2 Score 0 0 0 0     Cognitive Function     6CIT Screen 03/02/2017  What Year? 0 points  What month? 0 points  What time? 0 points  Count back from 20 0 points  Months in reverse  0 points  Repeat phrase 0 points  Total Score 0    Immunization History  Administered Date(s) Administered  . Influenza,inj,Quad PF,36+ Mos 04/28/2016  . Influenza-Unspecified 05/12/2015  . PPD Test 05/29/2015  . Pneumococcal Polysaccharide-23 03/27/2015  . Tdap 03/27/2015   Screening Tests Health Maintenance  Topic Date Due  . INFLUENZA VACCINE  03/11/2017  . TETANUS/TDAP  03/26/2025  . DEXA SCAN  Completed  . PNA vac Low Risk Adult  Completed      Plan:    I have personally reviewed and addressed the Medicare Annual Wellness questionnaire and have noted the following in the patient's chart:  A. Medical and social history B. Use of alcohol, tobacco or illicit drugs  C. Current medications and supplements D. Functional ability and status E.  Nutritional status F.  Physical activity G. Advance directives H. List of other physicians I.  Hospitalizations, surgeries, and ER visits in previous 12 months J.  [place] such as hearing and vision if needed, cognitive and depression L. Referrals and appointments  In addition, I have reviewed and discussed with patient certain preventive protocols, quality metrics, and best practice recommendations. A written personalized care plan for preventive services as well as general preventive health recommendations were provided to patient.   Signed,  Tyler Aas, LPN Nurse Health Advisor   MD Recommendations: needs refill on losartan

## 2017-03-15 ENCOUNTER — Other Ambulatory Visit: Payer: Self-pay | Admitting: Family Medicine

## 2017-03-15 DIAGNOSIS — I10 Essential (primary) hypertension: Secondary | ICD-10-CM

## 2017-04-12 ENCOUNTER — Other Ambulatory Visit: Payer: Self-pay | Admitting: Family Medicine

## 2017-04-12 DIAGNOSIS — I10 Essential (primary) hypertension: Secondary | ICD-10-CM

## 2017-04-13 ENCOUNTER — Other Ambulatory Visit: Payer: Self-pay | Admitting: Family Medicine

## 2017-04-13 DIAGNOSIS — E538 Deficiency of other specified B group vitamins: Secondary | ICD-10-CM

## 2017-04-19 ENCOUNTER — Other Ambulatory Visit: Payer: Self-pay | Admitting: Family Medicine

## 2017-04-19 DIAGNOSIS — I10 Essential (primary) hypertension: Secondary | ICD-10-CM

## 2017-05-07 ENCOUNTER — Other Ambulatory Visit: Payer: Self-pay | Admitting: Family Medicine

## 2017-05-07 DIAGNOSIS — E039 Hypothyroidism, unspecified: Secondary | ICD-10-CM

## 2017-05-26 DIAGNOSIS — Z23 Encounter for immunization: Secondary | ICD-10-CM | POA: Diagnosis not present

## 2017-06-27 ENCOUNTER — Other Ambulatory Visit: Payer: Self-pay | Admitting: Family Medicine

## 2017-06-27 DIAGNOSIS — E785 Hyperlipidemia, unspecified: Secondary | ICD-10-CM

## 2017-07-11 ENCOUNTER — Other Ambulatory Visit: Payer: Self-pay | Admitting: Family Medicine

## 2017-07-11 DIAGNOSIS — E538 Deficiency of other specified B group vitamins: Secondary | ICD-10-CM

## 2017-07-11 DIAGNOSIS — E039 Hypothyroidism, unspecified: Secondary | ICD-10-CM

## 2017-07-13 ENCOUNTER — Other Ambulatory Visit: Payer: Self-pay | Admitting: Family Medicine

## 2017-07-13 DIAGNOSIS — I10 Essential (primary) hypertension: Secondary | ICD-10-CM

## 2017-07-17 ENCOUNTER — Other Ambulatory Visit: Payer: Self-pay | Admitting: Family Medicine

## 2017-07-17 DIAGNOSIS — I10 Essential (primary) hypertension: Secondary | ICD-10-CM

## 2017-08-10 ENCOUNTER — Other Ambulatory Visit: Payer: Self-pay | Admitting: Family Medicine

## 2017-08-10 DIAGNOSIS — E039 Hypothyroidism, unspecified: Secondary | ICD-10-CM

## 2017-08-10 DIAGNOSIS — E538 Deficiency of other specified B group vitamins: Secondary | ICD-10-CM

## 2017-08-10 DIAGNOSIS — I10 Essential (primary) hypertension: Secondary | ICD-10-CM

## 2017-08-27 ENCOUNTER — Other Ambulatory Visit: Payer: Self-pay | Admitting: Family Medicine

## 2017-08-27 DIAGNOSIS — E785 Hyperlipidemia, unspecified: Secondary | ICD-10-CM

## 2017-08-31 ENCOUNTER — Encounter: Payer: Self-pay | Admitting: Family Medicine

## 2017-08-31 ENCOUNTER — Ambulatory Visit (INDEPENDENT_AMBULATORY_CARE_PROVIDER_SITE_OTHER): Payer: Medicare Other | Admitting: Family Medicine

## 2017-08-31 VITALS — BP 130/80 | HR 78 | Ht 62.0 in | Wt 135.0 lb

## 2017-08-31 DIAGNOSIS — E039 Hypothyroidism, unspecified: Secondary | ICD-10-CM

## 2017-08-31 DIAGNOSIS — I10 Essential (primary) hypertension: Secondary | ICD-10-CM

## 2017-08-31 DIAGNOSIS — M8588 Other specified disorders of bone density and structure, other site: Secondary | ICD-10-CM | POA: Diagnosis not present

## 2017-08-31 DIAGNOSIS — J3089 Other allergic rhinitis: Secondary | ICD-10-CM | POA: Diagnosis not present

## 2017-08-31 DIAGNOSIS — E782 Mixed hyperlipidemia: Secondary | ICD-10-CM

## 2017-08-31 DIAGNOSIS — E538 Deficiency of other specified B group vitamins: Secondary | ICD-10-CM | POA: Diagnosis not present

## 2017-08-31 DIAGNOSIS — E785 Hyperlipidemia, unspecified: Secondary | ICD-10-CM | POA: Diagnosis not present

## 2017-08-31 MED ORDER — LABETALOL HCL 300 MG PO TABS: 300.0000 mg | ORAL_TABLET | Freq: Every day | ORAL | 6 refills | Status: DC

## 2017-08-31 MED ORDER — FLUTICASONE PROPIONATE 50 MCG/ACT NA SUSP: 2.0000 | Freq: Every day | NASAL | 6 refills | Status: DC

## 2017-08-31 MED ORDER — LEVOTHYROXINE SODIUM 50 MCG PO TABS: 50.0000 ug | ORAL_TABLET | Freq: Every day | ORAL | 6 refills | Status: DC

## 2017-08-31 MED ORDER — LOSARTAN POTASSIUM-HCTZ 50-12.5 MG PO TABS: 1.0000 | ORAL_TABLET | Freq: Every day | ORAL | 6 refills | Status: DC

## 2017-08-31 MED ORDER — CYANOCOBALAMIN 1000 MCG/ML IJ SOLN: INTRAMUSCULAR | 11 refills | Status: DC

## 2017-08-31 MED ORDER — SIMVASTATIN 20 MG PO TABS: 20.0000 mg | ORAL_TABLET | Freq: Every day | ORAL | 6 refills | Status: DC

## 2017-08-31 NOTE — Progress Notes (Signed)
Name: Carly Marshall   MRN: [id number]    DOB: January 12, 1936   Date:08/31/2017       Progress Note  Subjective  Chief Complaint  Chief Complaint  Patient presents with  . Hypertension  . Hyperlipidemia  . Hypothyroidism  . vitamin b12 deficiency    gets the vial and daughter gives at home    Hypertension  This is a chronic problem. The current episode started more than 1 year ago. The problem is unchanged. The problem is controlled. Pertinent negatives include no anxiety, blurred vision, chest pain, headaches, malaise/fatigue, neck pain, orthopnea, palpitations, peripheral edema, PND, shortness of breath or sweats. There are no associated agents to hypertension. There are no known risk factors for coronary artery disease. Past treatments include nothing. The current treatment provides moderate improvement. There are no compliance problems.  There is no history of angina, kidney disease, CAD/MI, CVA, heart failure, left ventricular hypertrophy, PVD or retinopathy. Identifiable causes of hypertension include a thyroid problem. There is no history of chronic renal disease, a hypertension causing med or renovascular disease.  Hyperlipidemia  This is a chronic problem. The current episode started more than 1 year ago. The problem is controlled. She has no history of chronic renal disease, diabetes, hypothyroidism, liver disease, obesity or nephrotic syndrome. Factors aggravating her hyperlipidemia include thiazides. Pertinent negatives include no chest pain, focal weakness, myalgias or shortness of breath. Current antihyperlipidemic treatment includes statins. Risk factors for coronary artery disease include dyslipidemia, hypertension and post-menopausal.  Thyroid Problem  Presents for follow-up visit. Symptoms include weight loss. Patient reports no anxiety, cold intolerance, constipation, depressed mood, diaphoresis, diarrhea, dry skin, fatigue, hair loss, heat intolerance, hoarse voice, leg swelling,  menstrual problem, nail problem, palpitations, tremors, visual change or weight gain. The symptoms have been stable. Her past medical history is significant for hyperlipidemia. There is no history of diabetes or heart failure.  Anemia  Presents for follow-up visit. Symptoms include weight loss. There has been no abdominal pain, bruising/bleeding easily, confusion, fever, light-headedness, malaise/fatigue, pallor, palpitations or paresthesias. Signs of blood loss that are not present include hematemesis, hematochezia, melena and menorrhagia. There is no history of chronic renal disease, heart failure or hypothyroidism. There are no compliance problems.   Back Pain  This is a new problem. The current episode started in the past 7 days. The problem occurs daily. The problem has been waxing and waning since onset. The pain is present in the lumbar spine. The pain is moderate. Associated symptoms include weight loss. Pertinent negatives include no abdominal pain, chest pain, dysuria, fever, headaches, paresthesias or tingling.    No problem-specific Assessment & Plan notes found for this encounter.   Past Medical History:  Diagnosis Date  . Allergy   . B12 deficiency anemia   . Hyperlipidemia   . Hypertension   . Thyroid disease     Past Surgical History:  Procedure Laterality Date  . COLONOSCOPY  2012   normal- Dr Sonny Masters  . ECTOPIC PREGNANCY SURGERY      Family History  Problem Relation Age of Onset  . Diabetes Mother   . Heart disease Father   . Hyperlipidemia Father   . Hypertension Father   . Diabetes Sister   . Brain cancer Brother     Social History   Socioeconomic History  . Marital status: Married    Spouse name: Not on file  . Number of children: Not on file  . Years of education: Not on file  .  Highest education level: Not on file  Social Needs  . Financial resource strain: Not on file  . Food insecurity - worry: Not on file  . Food insecurity - inability: Not on  file  . Transportation needs - medical: Not on file  . Transportation needs - non-medical: Not on file  Occupational History  . Not on file  Tobacco Use  . Smoking status: Never Smoker  . Smokeless tobacco: Never Used  Substance and Sexual Activity  . Alcohol use: No    Alcohol/week: 0.0 oz  . Drug use: No  . Sexual activity: No  Other Topics Concern  . Not on file  Social History Narrative  . Not on file    No Known Allergies  Outpatient Medications Prior to Visit  Medication Sig Dispense Refill  . cyanocobalamin (,VITAMIN B-12,) 1000 MCG/ML injection INJECT 1 ML INTO THE MUSCLE EVERY 30 DAYS. 1 mL 0  . labetalol (NORMODYNE) 300 MG tablet TAKE 1 TABLET BY MOUTH EVERY DAY. 30 tablet 0  . levothyroxine (SYNTHROID, LEVOTHROID) 50 MCG tablet TAKE 1 TABLET BY MOUTH DAILY 30 tablet 0  . losartan-hydrochlorothiazide (HYZAAR) 50-12.5 MG tablet TAKE 1 TABLET BY MOUTH DAILY. 30 tablet 0  . simvastatin (ZOCOR) 20 MG tablet TAKE 1 TABLET BY MOUTH EVERY DAY 30 tablet 0  . labetalol (NORMODYNE) 300 MG tablet TAKE 1 TABLET BY MOUTH EVERY DAY 90 tablet 0  . losartan-hydrochlorothiazide (HYZAAR) 50-12.5 MG tablet TAKE 1 TABLET BY MOUTH DAILY. 30 tablet 0   No facility-administered medications prior to visit.     Review of Systems  Constitutional: Positive for weight loss. Negative for chills, diaphoresis, fatigue, fever, malaise/fatigue and weight gain.  HENT: Negative for ear discharge, ear pain, hoarse voice and sore throat.   Eyes: Negative for blurred vision.  Respiratory: Negative for cough, sputum production, shortness of breath and wheezing.   Cardiovascular: Negative for chest pain, palpitations, orthopnea, leg swelling and PND.  Gastrointestinal: Negative for abdominal pain, blood in stool, constipation, diarrhea, heartburn, hematemesis, hematochezia, melena and nausea.  Genitourinary: Negative for dysuria, frequency, hematuria, menorrhagia, menstrual problem and urgency.   Musculoskeletal: Negative for back pain, joint pain, myalgias and neck pain.  Skin: Negative for pallor and rash.  Neurological: Negative for dizziness, tingling, tremors, sensory change, focal weakness, light-headedness, headaches and paresthesias.  Endo/Heme/Allergies: Negative for environmental allergies, cold intolerance, heat intolerance and polydipsia. Does not bruise/bleed easily.  Psychiatric/Behavioral: Negative for confusion, depression and suicidal ideas. The patient is not nervous/anxious and does not have insomnia.      Objective  Vitals:   08/31/17 1330  BP: 130/80  Pulse: 78  Weight: 135 lb (61.2 kg)  Height: 5\' 2"  (1.575 m)    Physical Exam  Constitutional: She is well-developed, well-nourished, and in no distress. No distress.  HENT:  Head: Normocephalic and atraumatic.  Right Ear: External ear normal.  Left Ear: External ear normal.  Nose: Nose normal.  Mouth/Throat: Oropharynx is clear and moist.  Eyes: Conjunctivae and EOM are normal. Pupils are equal, round, and reactive to light. Right eye exhibits no discharge. Left eye exhibits no discharge.  Neck: Normal range of motion. Neck supple. No JVD present. No thyromegaly present.  Cardiovascular: Normal rate, regular rhythm, normal heart sounds and intact distal pulses. Exam reveals no gallop and no friction rub.  No murmur heard. Pulmonary/Chest: Effort normal and breath sounds normal. She has no wheezes. She has no rales.  Abdominal: Soft. Bowel sounds are normal. She exhibits no mass.  There is no tenderness. There is no guarding.  Musculoskeletal: Normal range of motion. She exhibits no edema.  Lymphadenopathy:    She has no cervical adenopathy.  Neurological: She is alert.  Skin: Skin is warm and dry. She is not diaphoretic.  Psychiatric: Mood and affect normal.  Nursing note and vitals reviewed.     Assessment & Plan  Problem List Items Addressed This Visit      Cardiovascular and Mediastinum    Essential hypertension   Relevant Medications   labetalol (NORMODYNE) 300 MG tablet   losartan-hydrochlorothiazide (HYZAAR) 50-12.5 MG tablet   simvastatin (ZOCOR) 20 MG tablet   Other Relevant Orders   Renal Function Panel     Other   Hyperlipidemia   Relevant Medications   labetalol (NORMODYNE) 300 MG tablet   losartan-hydrochlorothiazide (HYZAAR) 50-12.5 MG tablet   simvastatin (ZOCOR) 20 MG tablet   Other Relevant Orders   Lipid Profile   B12 deficiency   Relevant Medications   cyanocobalamin (,VITAMIN B-12,) 1000 MCG/ML injection   Other Relevant Orders   Hemoglobin    Other Visit Diagnoses    Osteopenia of lumbar spine    -  Primary   Hypothyroidism       Relevant Medications   labetalol (NORMODYNE) 300 MG tablet   levothyroxine (SYNTHROID, LEVOTHROID) 50 MCG tablet   Other Relevant Orders   TSH   Non-seasonal allergic rhinitis, unspecified trigger       Relevant Medications   fluticasone (FLONASE) 50 MCG/ACT nasal spray      Meds ordered this encounter  Medications  . cyanocobalamin (,VITAMIN B-12,) 1000 MCG/ML injection    Sig: INJECT 1 ML INTO THE MUSCLE EVERY 30 DAYS.    Dispense:  1 mL    Refill:  11  . labetalol (NORMODYNE) 300 MG tablet    Sig: Take 1 tablet (300 mg total) by mouth daily.    Dispense:  30 tablet    Refill:  6  . losartan-hydrochlorothiazide (HYZAAR) 50-12.5 MG tablet    Sig: Take 1 tablet by mouth daily.    Dispense:  30 tablet    Refill:  6  . simvastatin (ZOCOR) 20 MG tablet    Sig: Take 1 tablet (20 mg total) by mouth daily.    Dispense:  30 tablet    Refill:  6  . levothyroxine (SYNTHROID, LEVOTHROID) 50 MCG tablet    Sig: Take 1 tablet (50 mcg total) by mouth daily.    Dispense:  30 tablet    Refill:  6  . fluticasone (FLONASE) 50 MCG/ACT nasal spray    Sig: Place 2 sprays into both nostrils daily.    Dispense:  16 g    Refill:  6  Immunizations are reviewed and recommendations provided.   Age appropriate screening tests  are discussed. Counseling given for risk factor reduction interventions. Suggest vitamin d with calcium. LAYCEE FITZSIMMONS is a 82 y.o. female who presents today for her Complete Annual Exam. She feels well. She reports exercising occaisionally. She reports she is sleeping well.  Diet is generally healthy. Dr. [facility]  08/31/17

## 2017-09-01 ENCOUNTER — Other Ambulatory Visit: Payer: Self-pay

## 2017-09-01 DIAGNOSIS — R7989 Other specified abnormal findings of blood chemistry: Secondary | ICD-10-CM

## 2017-09-01 LAB — LIPID PANEL
Chol/HDL Ratio: 2.6 ratio (ref 0.0–4.4)
Cholesterol, Total: 136 mg/dL (ref 100–199)
HDL: 53 mg/dL (ref >39)
LDL Calculated: 65 mg/dL (ref 0–99)
Triglycerides: 88 mg/dL (ref 0–149)
VLDL Cholesterol Cal: 18 mg/dL (ref 5–40)

## 2017-09-01 LAB — RENAL FUNCTION PANEL
Albumin: 4.5 g/dL (ref 3.5–4.8)
BUN/Creatinine Ratio: 21 (ref 12–28)
BUN: 39 mg/dL — ABNORMAL HIGH (ref 8–27)
CO2: 27 mmol/L (ref 20–29)
Calcium: 9.9 mg/dL (ref 8.7–10.3)
Chloride: 100 mmol/L (ref 96–106)
Creatinine, Ser: 1.89 mg/dL — ABNORMAL HIGH (ref 0.57–1.00)
GFR calc Af Amer: 29 mL/min/{1.73_m2} — ABNORMAL LOW (ref >59)
GFR calc non Af Amer: 25 mL/min/{1.73_m2} — ABNORMAL LOW (ref >59)
Glucose: 86 mg/dL (ref 65–99)
Phosphorus: 4.2 mg/dL (ref 2.5–4.5)
Potassium: 4.1 mmol/L (ref 3.5–5.2)
Sodium: 140 mmol/L (ref 134–144)

## 2017-09-01 LAB — TSH: TSH: 1.64 u[IU]/mL (ref 0.450–4.500)

## 2017-09-01 LAB — HEMOGLOBIN: Hemoglobin: 11.7 g/dL (ref 11.1–15.9)

## 2017-09-11 ENCOUNTER — Other Ambulatory Visit: Payer: Self-pay | Admitting: Family Medicine

## 2017-09-11 DIAGNOSIS — E538 Deficiency of other specified B group vitamins: Secondary | ICD-10-CM

## 2017-09-13 ENCOUNTER — Other Ambulatory Visit: Payer: Self-pay | Admitting: Family Medicine

## 2017-09-13 DIAGNOSIS — E039 Hypothyroidism, unspecified: Secondary | ICD-10-CM

## 2017-09-28 ENCOUNTER — Other Ambulatory Visit: Payer: Self-pay | Admitting: Family Medicine

## 2017-09-28 DIAGNOSIS — E785 Hyperlipidemia, unspecified: Secondary | ICD-10-CM

## 2017-10-08 DIAGNOSIS — N390 Urinary tract infection, site not specified: Secondary | ICD-10-CM | POA: Diagnosis not present

## 2017-10-08 DIAGNOSIS — I10 Essential (primary) hypertension: Secondary | ICD-10-CM | POA: Diagnosis not present

## 2017-10-08 DIAGNOSIS — N179 Acute kidney failure, unspecified: Secondary | ICD-10-CM | POA: Diagnosis not present

## 2017-10-13 DIAGNOSIS — N179 Acute kidney failure, unspecified: Secondary | ICD-10-CM | POA: Diagnosis not present

## 2017-10-19 ENCOUNTER — Other Ambulatory Visit: Payer: Self-pay | Admitting: Family Medicine

## 2017-10-19 DIAGNOSIS — I10 Essential (primary) hypertension: Secondary | ICD-10-CM

## 2017-10-22 DIAGNOSIS — N183 Chronic kidney disease, stage 3 (moderate): Secondary | ICD-10-CM | POA: Diagnosis not present

## 2017-10-22 DIAGNOSIS — N390 Urinary tract infection, site not specified: Secondary | ICD-10-CM | POA: Diagnosis not present

## 2017-10-22 DIAGNOSIS — N179 Acute kidney failure, unspecified: Secondary | ICD-10-CM | POA: Diagnosis not present

## 2017-10-22 DIAGNOSIS — N281 Cyst of kidney, acquired: Secondary | ICD-10-CM | POA: Diagnosis not present

## 2017-10-22 DIAGNOSIS — I10 Essential (primary) hypertension: Secondary | ICD-10-CM | POA: Diagnosis not present

## 2017-12-03 DIAGNOSIS — H35033 Hypertensive retinopathy, bilateral: Secondary | ICD-10-CM | POA: Diagnosis not present

## 2017-12-03 DIAGNOSIS — H43813 Vitreous degeneration, bilateral: Secondary | ICD-10-CM | POA: Diagnosis not present

## 2017-12-03 DIAGNOSIS — H40013 Open angle with borderline findings, low risk, bilateral: Secondary | ICD-10-CM | POA: Diagnosis not present

## 2017-12-14 ENCOUNTER — Telehealth: Payer: Self-pay

## 2017-12-14 ENCOUNTER — Other Ambulatory Visit: Payer: Self-pay

## 2017-12-14 MED ORDER — AMOXICILLIN 500 MG PO CAPS: 500.0000 mg | ORAL_CAPSULE | Freq: Three times a day (TID) | ORAL | 0 refills | Status: DC

## 2017-12-14 NOTE — Telephone Encounter (Signed)
Pt called in needing antibiotic- sent in and will see in morning

## 2017-12-15 ENCOUNTER — Encounter: Payer: Self-pay | Admitting: Family Medicine

## 2017-12-15 ENCOUNTER — Ambulatory Visit
Admission: RE | Admit: 2017-12-15 | Discharge: 2017-12-15 | Disposition: A | Discharge status: Home / Self Care | Payer: Medicare Other | Source: Ambulatory Visit | Attending: Family Medicine | Admitting: Family Medicine

## 2017-12-15 ENCOUNTER — Ambulatory Visit (INDEPENDENT_AMBULATORY_CARE_PROVIDER_SITE_OTHER): Payer: Medicare Other | Admitting: Family Medicine

## 2017-12-15 VITALS — BP 130/70 | HR 52 | Temp 98.1°F | Resp 14 | Ht 62.0 in | Wt 133.0 lb

## 2017-12-15 DIAGNOSIS — R918 Other nonspecific abnormal finding of lung field: Secondary | ICD-10-CM | POA: Insufficient documentation

## 2017-12-15 DIAGNOSIS — J0100 Acute maxillary sinusitis, unspecified: Secondary | ICD-10-CM | POA: Diagnosis not present

## 2017-12-15 DIAGNOSIS — R509 Fever, unspecified: Secondary | ICD-10-CM | POA: Insufficient documentation

## 2017-12-15 DIAGNOSIS — J40 Bronchitis, not specified as acute or chronic: Secondary | ICD-10-CM

## 2017-12-15 DIAGNOSIS — R05 Cough: Secondary | ICD-10-CM | POA: Insufficient documentation

## 2017-12-15 MED ORDER — GUAIFENESIN-CODEINE 100-10 MG/5ML PO SYRP: 5.0000 mL | ORAL_SOLUTION | Freq: Three times a day (TID) | ORAL | 0 refills | Status: DC | PRN

## 2017-12-15 NOTE — Progress Notes (Signed)
Name: Carly Marshall   MRN: [id number]    DOB: Oct 12, 1935   Date:12/15/2017       Progress Note  Subjective  Chief Complaint  Chief Complaint  Patient presents with  . Sinusitis    sent in Amox yesterday- needs cough syrup    Sinusitis  This is a new problem. The current episode started 1 to 4 weeks ago (2 weeks). The problem has been gradually worsening since onset. The maximum temperature recorded prior to her arrival was 100.4 - 100.9 F (100.5). Her pain is at a severity of 4/10. The pain is mild. Associated symptoms include chills, congestion, coughing, headaches, a hoarse voice, sinus pressure and sneezing. Pertinent negatives include no diaphoresis, ear pain, neck pain, shortness of breath, sore throat or swollen glands. The treatment provided mild relief.  Cough  This is a new problem. The current episode started 1 to 4 weeks ago (2 weeks). The cough is non-productive. Associated symptoms include chills, a fever, headaches, nasal congestion, postnasal drip, rhinorrhea, sweats and wheezing. Pertinent negatives include no chest pain, ear pain, heartburn, hemoptysis, myalgias, rash, sore throat, shortness of breath or weight loss. The symptoms are aggravated by pollens. There is no history of environmental allergies.    No problem-specific Assessment & Plan notes found for this encounter.   Past Medical History:  Diagnosis Date  . Allergy   . B12 deficiency anemia   . Hyperlipidemia   . Hypertension   . Thyroid disease     Past Surgical History:  Procedure Laterality Date  . COLONOSCOPY  2012   normal- Dr Sonny Masters  . ECTOPIC PREGNANCY SURGERY      Family History  Problem Relation Age of Onset  . Diabetes Mother   . Heart disease Father   . Hyperlipidemia Father   . Hypertension Father   . Diabetes Sister   . Brain cancer Brother     Social History   Socioeconomic History  . Marital status: Married    Spouse name: Not on file  . Number of children: Not on file  .  Years of education: Not on file  . Highest education level: Not on file  Occupational History  . Not on file  Social Needs  . Financial resource strain: Not on file  . Food insecurity:    Worry: Not on file    Inability: Not on file  . Transportation needs:    Medical: Not on file    Non-medical: Not on file  Tobacco Use  . Smoking status: Never Smoker  . Smokeless tobacco: Never Used  Substance and Sexual Activity  . Alcohol use: No    Alcohol/week: 0.0 oz  . Drug use: No  . Sexual activity: Never  Lifestyle  . Physical activity:    Days per week: Not on file    Minutes per session: Not on file  . Stress: Not on file  Relationships  . Social connections:    Talks on phone: Not on file    Gets together: Not on file    Attends religious service: Not on file    Active member of club or organization: Not on file    Attends meetings of clubs or organizations: Not on file    Relationship status: Not on file  . Intimate partner violence:    Fear of current or ex partner: Not on file    Emotionally abused: Not on file    Physically abused: Not on file    Forced sexual  activity: Not on file  Other Topics Concern  . Not on file  Social History Narrative  . Not on file    No Known Allergies  Outpatient Medications Prior to Visit  Medication Sig Dispense Refill  . amoxicillin (AMOXIL) 500 MG capsule Take 1 capsule (500 mg total) by mouth 3 (three) times daily. 30 capsule 0  . cyanocobalamin (,VITAMIN B-12,) 1000 MCG/ML injection INJECT 1 ML INTO THE MUSCLE EVERY 30 DAYS. 1 mL 11  . fluticasone (FLONASE) 50 MCG/ACT nasal spray Place 2 sprays into both nostrils daily. 16 g 6  . labetalol (NORMODYNE) 300 MG tablet Take 1 tablet (300 mg total) by mouth daily. 30 tablet 6  . levothyroxine (SYNTHROID, LEVOTHROID) 50 MCG tablet Take 1 tablet (50 mcg total) by mouth daily. 30 tablet 6  . losartan-hydrochlorothiazide (HYZAAR) 50-12.5 MG tablet Take 1 tablet by mouth daily. 30 tablet  6  . simvastatin (ZOCOR) 20 MG tablet Take 1 tablet (20 mg total) by mouth daily. 30 tablet 6  . cyanocobalamin (,VITAMIN B-12,) 1000 MCG/ML injection INJECT 1 ML INTO THE MUSCLE EVERY 30 DAYS. 1 mL 0  . labetalol (NORMODYNE) 300 MG tablet TAKE 1 TABLET BY MOUTH EVERY DAY 90 tablet 0  . levothyroxine (SYNTHROID, LEVOTHROID) 50 MCG tablet TAKE 1 TABLET BY MOUTH DAILY 30 tablet 5  . simvastatin (ZOCOR) 20 MG tablet TAKE 1 TABLET BY MOUTH EVERY DAY 30 tablet 5   No facility-administered medications prior to visit.     Review of Systems  Constitutional: Positive for chills and fever. Negative for diaphoresis, malaise/fatigue and weight loss.  HENT: Positive for congestion, hoarse voice, postnasal drip, rhinorrhea, sinus pressure and sneezing. Negative for ear discharge, ear pain and sore throat.   Eyes: Negative for blurred vision.  Respiratory: Positive for cough and wheezing. Negative for hemoptysis, sputum production and shortness of breath.   Cardiovascular: Negative for chest pain, palpitations and leg swelling.  Gastrointestinal: Negative for abdominal pain, blood in stool, constipation, diarrhea, heartburn, melena and nausea.  Genitourinary: Negative for dysuria, frequency, hematuria and urgency.  Musculoskeletal: Negative for back pain, joint pain, myalgias and neck pain.  Skin: Negative for rash.  Neurological: Positive for headaches. Negative for dizziness, tingling, sensory change and focal weakness.  Endo/Heme/Allergies: Negative for environmental allergies and polydipsia. Does not bruise/bleed easily.  Psychiatric/Behavioral: Negative for depression and suicidal ideas. The patient is not nervous/anxious and does not have insomnia.      Objective  Vitals:   12/15/17 0830  BP: 130/70  Pulse: (!) 52  Resp: 14  Temp: 98.1 F (36.7 C)  TempSrc: Oral  SpO2: 98%  Weight: 133 lb (60.3 kg)  Height: 5\' 2"  (1.575 m)    Physical Exam  Constitutional: She is oriented to person,  place, and time. She appears well-developed and well-nourished.  HENT:  Head: Normocephalic.  Right Ear: External ear normal.  Left Ear: External ear normal.  Mouth/Throat: Oropharynx is clear and moist.  Eyes: Pupils are equal, round, and reactive to light. Conjunctivae and EOM are normal. Lids are everted and swept, no foreign bodies found. Left eye exhibits no hordeolum. No foreign body present in the left eye. Right conjunctiva is not injected. Left conjunctiva is not injected. No scleral icterus.  Neck: Normal range of motion. Neck supple. No JVD present. No tracheal deviation present. No thyromegaly present.  Cardiovascular: Normal rate, regular rhythm, normal heart sounds and intact distal pulses. Exam reveals no gallop and no friction rub.  No murmur heard.  Pulmonary/Chest: Effort normal. No respiratory distress. She has no decreased breath sounds. She has no wheezes. She has no rhonchi. She has rales in the right lower field and the left lower field.  Abdominal: Soft. Bowel sounds are normal. She exhibits no mass. There is no hepatosplenomegaly. There is no tenderness. There is no rebound and no guarding.  Musculoskeletal: Normal range of motion. She exhibits no edema or tenderness.  Lymphadenopathy:    She has no cervical adenopathy.  Neurological: She is alert and oriented to person, place, and time. She has normal strength. She displays normal reflexes. No cranial nerve deficit.  Skin: Skin is warm. No rash noted.  Psychiatric: She has a normal mood and affect. Her mood appears not anxious. She does not exhibit a depressed mood.  Nursing note and vitals reviewed.     Assessment & Plan  Problem List Items Addressed This Visit    None    Visit Diagnoses    Acute maxillary sinusitis, recurrence not specified    -  Primary   Relevant Medications   guaiFENesin-codeine (ROBITUSSIN AC) 100-10 MG/5ML syrup   Bronchitis       Relevant Medications   guaiFENesin-codeine (ROBITUSSIN  AC) 100-10 MG/5ML syrup   Other Relevant Orders   DG Chest 2 View (Completed)      Meds ordered this encounter  Medications  . guaiFENesin-codeine (ROBITUSSIN AC) 100-10 MG/5ML syrup    Sig: Take 5 mLs by mouth 3 (three) times daily as needed for cough.    Dispense:  150 mL    Refill:  0      Dr. [facility]  12/15/17

## 2018-01-25 DIAGNOSIS — I10 Essential (primary) hypertension: Secondary | ICD-10-CM | POA: Diagnosis not present

## 2018-01-25 DIAGNOSIS — N390 Urinary tract infection, site not specified: Secondary | ICD-10-CM | POA: Diagnosis not present

## 2018-01-25 DIAGNOSIS — N183 Chronic kidney disease, stage 3 (moderate): Secondary | ICD-10-CM | POA: Diagnosis not present

## 2018-01-25 DIAGNOSIS — N281 Cyst of kidney, acquired: Secondary | ICD-10-CM | POA: Diagnosis not present

## 2018-01-28 ENCOUNTER — Other Ambulatory Visit: Payer: Self-pay | Admitting: Family Medicine

## 2018-01-28 DIAGNOSIS — I129 Hypertensive chronic kidney disease with stage 1 through stage 4 chronic kidney disease, or unspecified chronic kidney disease: Secondary | ICD-10-CM | POA: Diagnosis not present

## 2018-01-28 DIAGNOSIS — N281 Cyst of kidney, acquired: Secondary | ICD-10-CM | POA: Diagnosis not present

## 2018-01-28 DIAGNOSIS — N184 Chronic kidney disease, stage 4 (severe): Secondary | ICD-10-CM | POA: Diagnosis not present

## 2018-01-28 DIAGNOSIS — E785 Hyperlipidemia, unspecified: Secondary | ICD-10-CM

## 2018-03-08 ENCOUNTER — Ambulatory Visit: Payer: Self-pay

## 2018-03-08 DIAGNOSIS — H40013 Open angle with borderline findings, low risk, bilateral: Secondary | ICD-10-CM | POA: Diagnosis not present

## 2018-03-09 ENCOUNTER — Other Ambulatory Visit: Payer: Self-pay | Admitting: Family Medicine

## 2018-03-09 DIAGNOSIS — E039 Hypothyroidism, unspecified: Secondary | ICD-10-CM

## 2018-03-09 DIAGNOSIS — I10 Essential (primary) hypertension: Secondary | ICD-10-CM

## 2018-03-10 ENCOUNTER — Ambulatory Visit (INDEPENDENT_AMBULATORY_CARE_PROVIDER_SITE_OTHER): Payer: Medicare Other

## 2018-03-10 VITALS — BP 150/64 | HR 60 | Temp 98.1°F | Resp 12 | Ht 62.0 in | Wt 132.6 lb

## 2018-03-10 DIAGNOSIS — Z0000 Encounter for general adult medical examination without abnormal findings: Secondary | ICD-10-CM

## 2018-03-10 DIAGNOSIS — E2839 Other primary ovarian failure: Secondary | ICD-10-CM

## 2018-03-10 DIAGNOSIS — M8580 Other specified disorders of bone density and structure, unspecified site: Secondary | ICD-10-CM

## 2018-03-10 NOTE — Patient Instructions (Signed)
Ms. Carly Marshall , Thank you for taking time to come for your Medicare Wellness Visit. I appreciate your ongoing commitment to your health goals. Please review the following plan we discussed and let me know if I can assist you in the future.   Screening recommendations/referrals: Colorectal Screening: No longer required Mammogram: No longer required Bone Density: Ordered today  Vision and Dental Exams: Recommended annual ophthalmology exams for early detection of glaucoma and other disorders of the eye Recommended annual dental exams for proper oral hygiene  Vaccinations: Influenza vaccine: Up to date Pneumococcal vaccine: Up to date Tdap vaccine: Up to date Shingles vaccine: Please call your insurance company to determine your out of pocket expense for the Shingrix vaccine. You may also receive this vaccine at your local pharmacy or Health Dept.   Advanced directives: Advance directive discussed with you today. I have provided a copy for you to complete at home and have notarized. Once this is complete please bring a copy in to our office so we can scan it into your chart.  Goals: Recommend to drink at least 6-8 8oz glasses of water per day.  Next appointment: Please schedule your Annual Wellness Visit with your Nurse Health Advisor in one year.  Preventive Care 80 Years and Older, Female Preventive care refers to lifestyle choices and visits with your health care provider that can promote health and wellness. What does preventive care include?  A yearly physical exam. This is also called an annual well check.  Dental exams once or twice a year.  Routine eye exams. Ask your health care provider how often you should have your eyes checked.  Personal lifestyle choices, including:  Daily care of your teeth and gums.  Regular physical activity.  Eating a healthy diet.  Avoiding tobacco and drug use.  Limiting alcohol use.  Practicing safe sex.  Taking low-dose aspirin every  day.  Taking vitamin and mineral supplements as recommended by your health care provider. What happens during an annual well check? The services and screenings done by your health care provider during your annual well check will depend on your age, overall health, lifestyle risk factors, and family history of disease. Counseling  Your health care provider may ask you questions about your:  Alcohol use.  Tobacco use.  Drug use.  Emotional well-being.  Home and relationship well-being.  Sexual activity.  Eating habits.  History of falls.  Memory and ability to understand (cognition).  Work and work Statistician.  Reproductive health. Screening  You may have the following tests or measurements:  Height, weight, and BMI.  Blood pressure.  Lipid and cholesterol levels. These may be checked every 5 years, or more frequently if you are over 3 years old.  Skin check.  Lung cancer screening. You may have this screening every year starting at age 80 if you have a 30-pack-year history of smoking and currently smoke or have quit within the past 15 years.  Fecal occult blood test (FOBT) of the stool. You may have this test every year starting at age 80.  Flexible sigmoidoscopy or colonoscopy. You may have a sigmoidoscopy every 5 years or a colonoscopy every 10 years starting at age 40.  Hepatitis C blood test.  Hepatitis B blood test.  Sexually transmitted disease (STD) testing.  Diabetes screening. This is done by checking your blood sugar (glucose) after you have not eaten for a while (fasting). You may have this done every 1-3 years.  Bone density scan. This is done  to screen for osteoporosis. You may have this done starting at age 80.  Mammogram. This may be done every 1-2 years. Talk to your health care provider about how often you should have regular mammograms. Talk with your health care provider about your test results, treatment options, and if necessary, the need  for more tests. Vaccines  Your health care provider may recommend certain vaccines, such as:  Influenza vaccine. This is recommended every year.  Tetanus, diphtheria, and acellular pertussis (Tdap, Td) vaccine. You may need a Td booster every 10 years.  Zoster vaccine. You may need this after age 35.  Pneumococcal 13-valent conjugate (PCV13) vaccine. One dose is recommended after age 80.  Pneumococcal polysaccharide (PPSV23) vaccine. One dose is recommended after age 80. Talk to your health care provider about which screenings and vaccines you need and how often you need them. This information is not intended to replace advice given to you by your health care provider. Make sure you discuss any questions you have with your health care provider. Document Released: 08/24/2015 Document Revised: 04/16/2016 Document Reviewed: 05/29/2015 Elsevier Interactive Patient Education  2017 [employer] Prevention in the Home Falls can cause injuries. They can happen to people of all ages. There are many things you can do to make your home safe and to help prevent falls. What can I do on the outside of my home?  Regularly fix the edges of walkways and driveways and fix any cracks.  Remove anything that might make you trip as you walk through a door, such as a raised step or threshold.  Trim any bushes or trees on the path to your home.  Use bright outdoor lighting.  Clear any walking paths of anything that might make someone trip, such as rocks or tools.  Regularly check to see if handrails are loose or broken. Make sure that both sides of any steps have handrails.  Any raised decks and porches should have guardrails on the edges.  Have any leaves, snow, or ice cleared regularly.  Use sand or salt on walking paths during winter.  Clean up any spills in your garage right away. This includes oil or grease spills. What can I do in the bathroom?  Use night lights.  Install grab bars  by the toilet and in the tub and shower. Do not use towel bars as grab bars.  Use non-skid mats or decals in the tub or shower.  If you need to sit down in the shower, use a plastic, non-slip stool.  Keep the floor dry. Clean up any water that spills on the floor as soon as it happens.  Remove soap buildup in the tub or shower regularly.  Attach bath mats securely with double-sided non-slip rug tape.  Do not have throw rugs and other things on the floor that can make you trip. What can I do in the bedroom?  Use night lights.  Make sure that you have a light by your bed that is easy to reach.  Do not use any sheets or blankets that are too big for your bed. They should not hang down onto the floor.  Have a firm chair that has side arms. You can use this for support while you get dressed.  Do not have throw rugs and other things on the floor that can make you trip. What can I do in the kitchen?  Clean up any spills right away.  Avoid walking on wet floors.  Keep items  that you use a lot in easy-to-reach places.  If you need to reach something above you, use a strong step stool that has a grab bar.  Keep electrical cords out of the way.  Do not use floor polish or wax that makes floors slippery. If you must use wax, use non-skid floor wax.  Do not have throw rugs and other things on the floor that can make you trip. What can I do with my stairs?  Do not leave any items on the stairs.  Make sure that there are handrails on both sides of the stairs and use them. Fix handrails that are broken or loose. Make sure that handrails are as long as the stairways.  Check any carpeting to make sure that it is firmly attached to the stairs. Fix any carpet that is loose or worn.  Avoid having throw rugs at the top or bottom of the stairs. If you do have throw rugs, attach them to the floor with carpet tape.  Make sure that you have a light switch at the top of the stairs and the  bottom of the stairs. If you do not have them, ask someone to add them for you. What else can I do to help prevent falls?  Wear shoes that:  Do not have high heels.  Have rubber bottoms.  Are comfortable and fit you well.  Are closed at the toe. Do not wear sandals.  If you use a stepladder:  Make sure that it is fully opened. Do not climb a closed stepladder.  Make sure that both sides of the stepladder are locked into place.  Ask someone to hold it for you, if possible.  Clearly mark and make sure that you can see:  Any grab bars or handrails.  First and last steps.  Where the edge of each step is.  Use tools that help you move around (mobility aids) if they are needed. These include:  Canes.  Walkers.  Scooters.  Crutches.  Turn on the lights when you go into a dark area. Replace any light bulbs as soon as they burn out.  Set up your furniture so you have a clear path. Avoid moving your furniture around.  If any of your floors are uneven, fix them.  If there are any pets around you, be aware of where they are.  Review your medicines with your doctor. Some medicines can make you feel dizzy. This can increase your chance of falling. Ask your doctor what other things that you can do to help prevent falls. This information is not intended to replace advice given to you by your health care provider. Make sure you discuss any questions you have with your health care provider. Document Released: 05/24/2009 Document Revised: 01/03/2016 Document Reviewed: 09/01/2014 Elsevier Interactive Patient Education  2017 [employer].

## 2018-03-10 NOTE — Progress Notes (Signed)
Subjective:   Carly Marshall is a 82 y.o. female who presents for Medicare Annual (Subsequent) preventive examination.  Review of Systems:  N/A Cardiac Risk Factors include: advanced age (>18men, >44 women);dyslipidemia;sedentary lifestyle;hypertension     Objective:     Vitals: BP (!) 150/64 (BP Location: Right Arm, Patient Position: Sitting, Cuff Size: Normal) Comment: Has not taken her antihypertensive yet today  Pulse 60   Temp 98.1 F (36.7 C) (Oral)   Resp 12   Ht 5\' 2"  (1.575 m)   Wt 132 lb 9.6 oz (60.1 kg)   SpO2 95%   BMI 24.25 kg/m   Body mass index is 24.25 kg/m.  Advanced Directives 03/10/2018 03/02/2017 03/27/2015  Does Patient Have a Medical Advance Directive? No No No  Would patient like information on creating a medical advance directive? Yes (MAU/Ambulatory/Procedural Areas - Information given) Yes (MAU/Ambulatory/Procedural Areas - Information given) -    Tobacco Social History   Tobacco Use  Smoking Status Never Smoker  Smokeless Tobacco Never Used  Tobacco Comment   smoking cessation materials not required     Counseling given: No Comment: smoking cessation materials not required  Clinical Intake:  Pre-visit preparation completed: Yes  Pain : No/denies pain   BMI - recorded: 24.25 Nutritional Status: BMI of 19-24  Normal Nutritional Risks: None Diabetes: No  How often do you need to have someone help you when you read instructions, pamphlets, or other written materials from your doctor or pharmacy?: 1 - Never  Interpreter Needed?: No  Information entered by :: AEversole, LPN  Past Medical History:  Diagnosis Date  . Allergy   . B12 deficiency anemia   . Hyperlipidemia   . Hypertension   . Thyroid disease    Past Surgical History:  Procedure Laterality Date  . COLONOSCOPY  2012   normal- Dr Sonny Masters  . ECTOPIC PREGNANCY SURGERY     Family History  Problem Relation Age of Onset  . Diabetes Mother   . Heart disease Mother   .  Fibromyalgia Mother   . Heart disease Father   . Hyperlipidemia Father   . Hypertension Father   . Diabetes Sister   . Brain cancer Brother   . Rheum arthritis Sister    Social History   Socioeconomic History  . Marital status: Married    Spouse name: Not on file  . Number of children: 2  . Years of education: Not on file  . Highest education level: 12th grade  Occupational History  . Occupation: Retired  . Occupation: Programmer, applications: [employer]  Social Needs  . Financial resource strain: Not hard at all  . Food insecurity:    Worry: Never true    Inability: Never true  . Transportation needs:    Medical: No    Non-medical: No  Tobacco Use  . Smoking status: Never Smoker  . Smokeless tobacco: Never Used  . Tobacco comment: smoking cessation materials not required  Substance and Sexual Activity  . Alcohol use: No    Alcohol/week: 0.0 oz  . Drug use: No  . Sexual activity: Never  Lifestyle  . Physical activity:    Days per week: 0 days    Minutes per session: 0 min  . Stress: Not at all  Relationships  . Social connections:    Talks on phone: Patient refused    Gets together: Patient refused    Attends religious service: Patient refused    Active member of  club or organization: Patient refused    Attends meetings of clubs or organizations: Patient refused    Relationship status: Married  Other Topics Concern  . Not on file  Social History Narrative  . Not on file    Outpatient Encounter Medications as of 03/10/2018  Medication Sig  . cyanocobalamin (,VITAMIN B-12,) 1000 MCG/ML injection INJECT 1 ML INTO THE MUSCLE EVERY 30 DAYS.  . fluticasone (FLONASE) 50 MCG/ACT nasal spray Place 2 sprays into both nostrils daily.  Marland Kitchen labetalol (NORMODYNE) 300 MG tablet Take 1 tablet (300 mg total) by mouth daily.  Marland Kitchen levothyroxine (SYNTHROID, LEVOTHROID) 50 MCG tablet TAKE 1 TABLET(50 MCG) BY MOUTH DAILY  . losartan-hydrochlorothiazide (HYZAAR)  50-12.5 MG tablet TAKE 1 TABLET BY MOUTH DAILY  . simvastatin (ZOCOR) 20 MG tablet TAKE 1 TABLET BY MOUTH EVERY DAY  . amoxicillin (AMOXIL) 500 MG capsule Take 1 capsule (500 mg total) by mouth 3 (three) times daily.  Marland Kitchen guaiFENesin-codeine (ROBITUSSIN AC) 100-10 MG/5ML syrup Take 5 mLs by mouth 3 (three) times daily as needed for cough.  . simvastatin (ZOCOR) 20 MG tablet Take 1 tablet (20 mg total) by mouth daily.   No facility-administered encounter medications on file as of 03/10/2018.     Activities of Daily Living In your present state of health, do you have any difficulty performing the following activities: 03/10/2018  Hearing? N  Comment denies hearing aids  Vision? N  Comment wears eyeglasses  Difficulty concentrating or making decisions? N  Walking or climbing stairs? N  Dressing or bathing? N  Doing errands, shopping? N  Comment denies dentures  Preparing Food and eating ? N  Comment full set upper and partial lower dentures  Using the Toilet? N  In the past six months, have you accidently leaked urine? N  Do you have problems with loss of bowel control? N  Managing your Medications? N  Managing your Finances? N  Housekeeping or managing your Housekeeping? N  Some recent data might be hidden    Patient Care Team: Juline Patch, MD as PCP - General (Family Medicine) Magnus Sinning, MD as Consulting Physician (Nephrology)    Assessment:   This is a routine wellness examination for [place].  Exercise Activities and Dietary recommendations Current Exercise Habits: The patient does not participate in regular exercise at present, Exercise limited by: None identified  Goals    . DIET - INCREASE WATER INTAKE     Recommend to drink at least 6-8 8oz glasses of water per day.       Fall Risk Fall Risk  03/10/2018 03/02/2017 11/20/2016 11/05/2015 09/27/2015  Falls in the past year? Yes Yes No No No  Comment lost balance - - - -  Number falls in past yr: 1 1 - - -    Comment - in the snow  - - -  Injury with Fall? No Yes - - -  Risk for fall due to : Impaired vision - - - -  Risk for fall due to: Comment wears eyeglasses - - - -  Follow up Falls evaluation completed;Education provided;Falls prevention discussed Falls prevention discussed - - -   FALL RISK PREVENTION PERTAINING TO HOME: Is your home free of loose throw rugs in walkways, pet beds, electrical cords, etc? Yes Is there adequate lighting in your home to reduce risk of falls?  Yes Are there stairs in or around your home WITH handrails? Yes  ASSISTIVE DEVICES UTILIZED TO PREVENT FALLS: Use of a  cane, walker or w/c? No Grab bars in the bathroom? Yes  Shower chair or a place to sit while bathing? Yes An elevated toilet seat or a handicapped toilet? Yes  Timed Get Up and Go Performed: Yes. Pt ambulated 10 feet within 10 sec. Gait stead-fast and without the use of an assistive device. No intervention required at this time. Fall risk prevention has been discussed.  Community Resource Referral:  [place] Referral not required at this time.   Depression Screen PHQ 2/9 Scores 03/10/2018 03/02/2017 11/20/2016 11/05/2015  PHQ - 2 Score 0 0 0 0  PHQ- 9 Score 0 - - -     Cognitive Function     6CIT Screen 03/10/2018 03/02/2017  What Year? 0 points 0 points  What month? 0 points 0 points  What time? 0 points 0 points  Count back from 20 0 points 0 points  Months in reverse 0 points 0 points  Repeat phrase 2 points 0 points  Total Score 2 0    Immunization History  Administered Date(s) Administered  . Influenza, High Dose Seasonal PF 05/26/2017  . Influenza,inj,Quad PF,6+ Mos 04/28/2016  . Influenza-Unspecified 05/12/2015  . PPD Test 05/29/2015  . Pneumococcal Polysaccharide-23 03/27/2015  . Tdap 03/27/2015    Qualifies for Shingles Vaccine? Yes. Due for Shingrix. Education has been provided regarding the importance of this vaccine. Pt has been advised to call insurance  company to determine out of pocket expense. Advised may also receive vaccine at local pharmacy or Health Dept. Verbalized acceptance and understanding.  Screening Tests Health Maintenance  Topic Date Due  . INFLUENZA VACCINE  03/11/2018  . TETANUS/TDAP  03/26/2025  . DEXA SCAN  Completed  . PNA vac Low Risk Adult  Completed    Cancer Screenings: Lung: Low Dose CT Chest recommended if Age 22-80 years, 30 pack-year currently smoking OR have quit w/in 15years. Patient does not qualify. Breast Screening: No longer required   Bone Density/Dexa: Completed 02/09/13. Results reflect osteopenia. Repeat every 2 years. Ordered today. Colorectal: No longer required  Additional Screenings: Hepatitis C Screening: Does not qualify    Plan:  I have personally reviewed and addressed the Medicare Annual Wellness questionnaire and have noted the following in the patient's chart:  A. Medical and social history B. Use of alcohol, tobacco or illicit drugs  C. Current medications and supplements D. Functional ability and status E.  Nutritional status F.  Physical activity G. Advance directives H. List of other physicians I.  Hospitalizations, surgeries, and ER visits in previous 12 months J.  [place] such as hearing and vision if needed, cognitive and depression L. Referrals and appointments  In addition, I have reviewed and discussed with patient certain preventive protocols, quality metrics, and best practice recommendations. A written personalized care plan for preventive services as well as general preventive health recommendations were provided to patient.  Signed,  Aleatha Borer, LPN Nurse Health Advisor  MD Recommendations: Due for Shingrix. Education has been provided regarding the importance of this vaccine. Pt has been advised to call insurance company to determine out of pocket expense. Advised may also receive vaccine at local pharmacy or Health Dept. Verbalized acceptance  and understanding.  Bone Density/Dexa: Completed 02/09/13. Results reflect osteopenia. Repeat every 2 years. Ordered today.

## 2018-03-24 ENCOUNTER — Ambulatory Visit
Admission: RE | Admit: 2018-03-24 | Discharge: 2018-03-24 | Disposition: A | Discharge status: Home / Self Care | Payer: Medicare Other | Source: Ambulatory Visit | Attending: Family Medicine | Admitting: Family Medicine

## 2018-03-24 DIAGNOSIS — E2839 Other primary ovarian failure: Secondary | ICD-10-CM

## 2018-03-24 DIAGNOSIS — M8589 Other specified disorders of bone density and structure, multiple sites: Secondary | ICD-10-CM | POA: Diagnosis not present

## 2018-03-24 DIAGNOSIS — M8580 Other specified disorders of bone density and structure, unspecified site: Secondary | ICD-10-CM | POA: Diagnosis not present

## 2018-03-24 DIAGNOSIS — Z780 Asymptomatic menopausal state: Secondary | ICD-10-CM | POA: Diagnosis not present

## 2018-03-24 DIAGNOSIS — M810 Age-related osteoporosis without current pathological fracture: Secondary | ICD-10-CM | POA: Diagnosis not present

## 2018-04-11 ENCOUNTER — Other Ambulatory Visit: Payer: Self-pay | Admitting: Family Medicine

## 2018-04-11 DIAGNOSIS — I10 Essential (primary) hypertension: Secondary | ICD-10-CM

## 2018-04-23 ENCOUNTER — Other Ambulatory Visit: Payer: Self-pay | Admitting: Family Medicine

## 2018-04-23 DIAGNOSIS — I10 Essential (primary) hypertension: Secondary | ICD-10-CM

## 2018-05-04 ENCOUNTER — Encounter: Payer: Self-pay | Admitting: Family Medicine

## 2018-05-04 ENCOUNTER — Ambulatory Visit (INDEPENDENT_AMBULATORY_CARE_PROVIDER_SITE_OTHER): Payer: Medicare Other | Admitting: Family Medicine

## 2018-05-04 VITALS — BP 130/64 | HR 76 | Ht 62.0 in | Wt 132.0 lb

## 2018-05-04 DIAGNOSIS — E538 Deficiency of other specified B group vitamins: Secondary | ICD-10-CM

## 2018-05-04 DIAGNOSIS — N3090 Cystitis, unspecified without hematuria: Secondary | ICD-10-CM

## 2018-05-04 DIAGNOSIS — E782 Mixed hyperlipidemia: Secondary | ICD-10-CM

## 2018-05-04 DIAGNOSIS — J3089 Other allergic rhinitis: Secondary | ICD-10-CM | POA: Diagnosis not present

## 2018-05-04 DIAGNOSIS — Z23 Encounter for immunization: Secondary | ICD-10-CM

## 2018-05-04 DIAGNOSIS — I10 Essential (primary) hypertension: Secondary | ICD-10-CM

## 2018-05-04 DIAGNOSIS — E039 Hypothyroidism, unspecified: Secondary | ICD-10-CM

## 2018-05-04 MED ORDER — CYANOCOBALAMIN 1000 MCG/ML IJ SOLN: INTRAMUSCULAR | 11 refills | Status: DC

## 2018-05-04 MED ORDER — NITROFURANTOIN MONOHYD MACRO 100 MG PO CAPS: 100.0000 mg | ORAL_CAPSULE | Freq: Two times a day (BID) | ORAL | 0 refills | Status: DC

## 2018-05-04 MED ORDER — LOSARTAN POTASSIUM-HCTZ 50-12.5 MG PO TABS: 1.0000 | ORAL_TABLET | Freq: Every day | ORAL | 1 refills | Status: DC

## 2018-05-04 MED ORDER — SIMVASTATIN 20 MG PO TABS: 20.0000 mg | ORAL_TABLET | Freq: Every day | ORAL | 1 refills | Status: DC

## 2018-05-04 MED ORDER — FLUTICASONE PROPIONATE 50 MCG/ACT NA SUSP: 2.0000 | Freq: Every day | NASAL | 11 refills | Status: DC

## 2018-05-04 MED ORDER — LABETALOL HCL 300 MG PO TABS: ORAL_TABLET | ORAL | 1 refills | Status: DC

## 2018-05-04 MED ORDER — LEVOTHYROXINE SODIUM 50 MCG PO TABS: ORAL_TABLET | ORAL | 1 refills | Status: DC

## 2018-05-04 NOTE — Progress Notes (Signed)
Date:  05/04/2018   Name:  Carly Marshall   DOB:  [birth year]   MRN:  [id number]   Chief Complaint: Hyperlipidemia; Hypertension; Hypothyroidism; Allergic Rhinitis ; Flu Vaccine; and Back Pain (lower back pain- wants urine checked) Hyperlipidemia  This is a chronic problem. The current episode started more than 1 year ago. The problem is controlled. Recent lipid tests were reviewed and are normal. She has no history of chronic renal disease, diabetes, hypothyroidism, liver disease, obesity or nephrotic syndrome. There are no known factors aggravating her hyperlipidemia. Pertinent negatives include no chest pain, focal sensory loss, focal weakness, leg pain, myalgias or shortness of breath. Current antihyperlipidemic treatment includes statins. The current treatment provides moderate improvement of lipids. There are no compliance problems.  Risk factors for coronary artery disease include hypertension, dyslipidemia and post-menopausal.  Hypertension  This is a chronic problem. The current episode started more than 1 year ago. The problem has been waxing and waning since onset. The problem is controlled. Pertinent negatives include no anxiety, blurred vision, chest pain, headaches, malaise/fatigue, neck pain, orthopnea, palpitations, peripheral edema, PND, shortness of breath or sweats. There are no associated agents to hypertension. Risk factors for coronary artery disease include dyslipidemia. Past treatments include angiotensin blockers and diuretics. The current treatment provides moderate improvement. There are no compliance problems.  There is no history of angina, kidney disease, CAD/MI, CVA, heart failure, left ventricular hypertrophy, PVD or retinopathy. Identifiable causes of hypertension include a thyroid problem. There is no history of chronic renal disease, a hypertension causing med or renovascular disease.  Back Pain  This is a recurrent problem. The current episode started in the past 7  days. The problem occurs intermittently. The pain is present in the lumbar spine. The quality of the pain is described as aching. The symptoms are aggravated by twisting. Pertinent negatives include no abdominal pain, bladder incontinence, bowel incontinence, chest pain, dysuria, fever, headaches, leg pain, numbness, paresthesias, pelvic pain or weakness.  Thyroid Problem  Presents for follow-up visit. Patient reports no anxiety, cold intolerance, constipation, diarrhea, dry skin, fatigue, hair loss, heat intolerance, menstrual problem or palpitations. The symptoms have been stable. Her past medical history is significant for hyperlipidemia. There is no history of diabetes or heart failure.  URI   This is a recurrent (for allergic rhinitis) problem. The current episode started in the past 7 days. The problem has been gradually improving. Associated symptoms include congestion and rhinorrhea. Pertinent negatives include no abdominal pain, chest pain, coughing, diarrhea, dysuria, ear pain, headaches, nausea, neck pain, rash, sneezing, sore throat, vomiting or wheezing.  Anemia  Presents for follow-up (B12 deficiency) visit. There has been no abdominal pain, anorexia, bruising/bleeding easily, confusion, fever, leg swelling, light-headedness, malaise/fatigue, palpitations or paresthesias. Signs of blood loss that are not present include hematemesis, hematochezia, melena, menorrhagia and vaginal bleeding. There is no history of chronic renal disease, heart failure or hypothyroidism. There are no compliance problems.      Review of Systems  Constitutional: Negative.  Negative for chills, fatigue, fever, malaise/fatigue and unexpected weight change.  HENT: Positive for congestion and rhinorrhea. Negative for ear discharge, ear pain, sinus pressure, sneezing and sore throat.   Eyes: Negative for blurred vision, photophobia, pain, discharge, redness and itching.  Respiratory: Negative for cough, shortness of  breath, wheezing and stridor.   Cardiovascular: Negative for chest pain, palpitations, orthopnea and PND.  Gastrointestinal: Negative for abdominal pain, anorexia, blood in stool, bowel incontinence, constipation, diarrhea, hematemesis, hematochezia, melena,  nausea and vomiting.  Endocrine: Negative for cold intolerance, heat intolerance, polydipsia, polyphagia and polyuria.  Genitourinary: Positive for frequency and urgency. Negative for bladder incontinence, dysuria, flank pain, hematuria, menorrhagia, menstrual problem, pelvic pain, vaginal bleeding and vaginal discharge.  Musculoskeletal: Positive for back pain. Negative for arthralgias, myalgias and neck pain.  Skin: Negative for rash.  Allergic/Immunologic: Negative for environmental allergies and food allergies.  Neurological: Negative for dizziness, focal weakness, weakness, light-headedness, numbness, headaches and paresthesias.  Hematological: Negative for adenopathy. Does not bruise/bleed easily.  Psychiatric/Behavioral: Negative for confusion and dysphoric mood. The patient is not nervous/anxious.     Patient Active Problem List   Diagnosis Date Noted  . B12 deficiency 04/28/2016  . Essential hypertension 03/27/2015  . Thyroid activity decreased 03/27/2015  . Hyperlipidemia 03/27/2015  . Allergic rhinitis due to pollen 03/27/2015    No Known Allergies  Past Surgical History:  Procedure Laterality Date  . COLONOSCOPY  2012   normal- Dr Sonny Masters  . ECTOPIC PREGNANCY SURGERY      Social History   Tobacco Use  . Smoking status: Never Smoker  . Smokeless tobacco: Never Used  . Tobacco comment: smoking cessation materials not required  Substance Use Topics  . Alcohol use: No    Alcohol/week: 0.0 standard drinks  . Drug use: No     Medication list has been reviewed and updated.  Current Meds  Medication Sig  . Calcium Carb-Cholecalciferol (CALCIUM 600 + D PO) Take 1 capsule by mouth 2 (two) times daily.  .  cyanocobalamin (,VITAMIN B-12,) 1000 MCG/ML injection INJECT 1 ML INTO THE MUSCLE EVERY 30 DAYS.  . fluticasone (FLONASE) 50 MCG/ACT nasal spray Place 2 sprays into both nostrils daily.  Marland Kitchen labetalol (NORMODYNE) 300 MG tablet TAKE 1 TABLET(300 MG) BY MOUTH DAILY  . levothyroxine (SYNTHROID, LEVOTHROID) 50 MCG tablet TAKE 1 TABLET(50 MCG) BY MOUTH DAILY  . losartan-hydrochlorothiazide (HYZAAR) 50-12.5 MG tablet Take 1 tablet by mouth daily.  . simvastatin (ZOCOR) 20 MG tablet Take 1 tablet (20 mg total) by mouth daily.  . vitamin C (ASCORBIC ACID) 500 MG tablet Take 500 mg by mouth daily.  . [DISCONTINUED] cyanocobalamin (,VITAMIN B-12,) 1000 MCG/ML injection INJECT 1 ML INTO THE MUSCLE EVERY 30 DAYS.  . [DISCONTINUED] fluticasone (FLONASE) 50 MCG/ACT nasal spray Place 2 sprays into both nostrils daily.  . [DISCONTINUED] labetalol (NORMODYNE) 300 MG tablet TAKE 1 TABLET(300 MG) BY MOUTH DAILY  . [DISCONTINUED] levothyroxine (SYNTHROID, LEVOTHROID) 50 MCG tablet TAKE 1 TABLET(50 MCG) BY MOUTH DAILY  . [DISCONTINUED] losartan-hydrochlorothiazide (HYZAAR) 50-12.5 MG tablet TAKE 1 TABLET BY MOUTH DAILY  . [DISCONTINUED] simvastatin (ZOCOR) 20 MG tablet Take 1 tablet (20 mg total) by mouth daily.    PHQ 2/9 Scores 03/10/2018 03/02/2017 11/20/2016 11/05/2015  PHQ - 2 Score 0 0 0 0  PHQ- 9 Score 0 - - -    Physical Exam  Constitutional: She is oriented to person, place, and time. She appears well-developed and well-nourished.  HENT:  Head: Normocephalic.  Right Ear: External ear normal.  Left Ear: External ear normal.  Mouth/Throat: Oropharynx is clear and moist.  Eyes: Pupils are equal, round, and reactive to light. Conjunctivae and EOM are normal. Lids are everted and swept, no foreign bodies found. Left eye exhibits no hordeolum. No foreign body present in the left eye. Right conjunctiva is not injected. Left conjunctiva is not injected. No scleral icterus.  Neck: Normal range of motion. Neck  supple. No JVD present. No tracheal deviation present. No  thyromegaly present.  Cardiovascular: Normal rate, regular rhythm, normal heart sounds and intact distal pulses. Exam reveals no gallop and no friction rub.  No murmur heard. Pulmonary/Chest: Effort normal and breath sounds normal. No respiratory distress. She has no wheezes. She has no rales.  Abdominal: Soft. Bowel sounds are normal. She exhibits no mass. There is no hepatosplenomegaly. There is no tenderness. There is no rebound and no guarding.  Musculoskeletal: Normal range of motion. She exhibits no edema or tenderness.  Lymphadenopathy:    She has no cervical adenopathy.  Neurological: She is alert and oriented to person, place, and time. She has normal strength. She displays normal reflexes. No cranial nerve deficit.  Skin: Skin is warm. No rash noted.  Psychiatric: She has a normal mood and affect. Her mood appears not anxious. She does not exhibit a depressed mood.  Nursing note and vitals reviewed.   BP 130/64   Pulse 76   Ht 5\' 2"  (1.575 m)   Wt 132 lb (59.9 kg)   BMI 24.14 kg/m   Assessment and Plan:  1. Essential hypertension Chronic Controlled Continue losartan/HCTZ and normodyne. - losartan-hydrochlorothiazide (HYZAAR) 50-12.5 MG tablet; Take 1 tablet by mouth daily.  Dispense: 90 tablet; Refill: 1 - labetalol (NORMODYNE) 300 MG tablet; TAKE 1 TABLET(300 MG) BY MOUTH DAILY  Dispense: 90 tablet; Refill: 1 - Renal Function Panel  2. B12 deficiency Chronic controlled continue B12 injections - cyanocobalamin (,VITAMIN B-12,) 1000 MCG/ML injection; INJECT 1 ML INTO THE MUSCLE EVERY 30 DAYS.  Dispense: 1 mL; Refill: 11  3. Hypothyroidism, unspecified type Chronic Stable check TSH Adjust Levothyroxin accordingly - levothyroxine (SYNTHROID, LEVOTHROID) 50 MCG tablet; TAKE 1 TABLET(50 MCG) BY MOUTH DAILY  Dispense: 90 tablet; Refill: 1 - TSH  4. Mixed hyperlipidemia Chronic Controlled Continue Zocor 20 mg -  simvastatin (ZOCOR) 20 MG tablet; Take 1 tablet (20 mg total) by mouth daily.  Dispense: 90 tablet; Refill: 1  5. Non-seasonal allergic rhinitis, unspecified trigger Controlled with Flonase - fluticasone (FLONASE) 50 MCG/ACT nasal spray; Place 2 sprays into both nostrils daily.  Dispense: 16 g; Refill: 11  6. Influenza vaccine needed Discussed and administered. - Flu vaccine HIGH DOSE PF (Fluzone High dose)  7. Cystitis Acute Macrbid bid for 3 days - nitrofurantoin, macrocrystal-monohydrate, (MACROBID) 100 MG capsule; Take 1 capsule (100 mg total) by mouth 2 (two) times daily.  Dispense: 6 capsule; Refill: 0   Dr. Paula Zietz [facility]  05/04/2018

## 2018-05-05 LAB — RENAL FUNCTION PANEL
Albumin: 4.6 g/dL (ref 3.5–4.8)
BUN/Creatinine Ratio: 18 (ref 12–28)
BUN: 28 mg/dL — ABNORMAL HIGH (ref 8–27)
CO2: 22 mmol/L (ref 20–29)
Calcium: 9.5 mg/dL (ref 8.7–10.3)
Chloride: 101 mmol/L (ref 96–106)
Creatinine, Ser: 1.52 mg/dL — ABNORMAL HIGH (ref 0.57–1.00)
GFR calc Af Amer: 37 mL/min/{1.73_m2} — ABNORMAL LOW (ref >59)
GFR calc non Af Amer: 32 mL/min/{1.73_m2} — ABNORMAL LOW (ref >59)
Glucose: 106 mg/dL — ABNORMAL HIGH (ref 65–99)
Phosphorus: 3.3 mg/dL (ref 2.5–4.5)
Potassium: 4 mmol/L (ref 3.5–5.2)
Sodium: 142 mmol/L (ref 134–144)

## 2018-05-05 LAB — TSH: TSH: 1.22 u[IU]/mL (ref 0.450–4.500)

## 2018-05-25 ENCOUNTER — Other Ambulatory Visit: Payer: Self-pay | Admitting: Family Medicine

## 2018-05-25 DIAGNOSIS — I10 Essential (primary) hypertension: Secondary | ICD-10-CM

## 2018-05-31 DIAGNOSIS — N281 Cyst of kidney, acquired: Secondary | ICD-10-CM | POA: Diagnosis not present

## 2018-05-31 DIAGNOSIS — N184 Chronic kidney disease, stage 4 (severe): Secondary | ICD-10-CM | POA: Diagnosis not present

## 2018-05-31 DIAGNOSIS — I10 Essential (primary) hypertension: Secondary | ICD-10-CM | POA: Diagnosis not present

## 2018-06-03 DIAGNOSIS — N183 Chronic kidney disease, stage 3 (moderate): Secondary | ICD-10-CM | POA: Diagnosis not present

## 2018-06-03 DIAGNOSIS — I129 Hypertensive chronic kidney disease with stage 1 through stage 4 chronic kidney disease, or unspecified chronic kidney disease: Secondary | ICD-10-CM | POA: Diagnosis not present

## 2018-06-03 DIAGNOSIS — N281 Cyst of kidney, acquired: Secondary | ICD-10-CM | POA: Diagnosis not present

## 2018-06-11 ENCOUNTER — Other Ambulatory Visit: Payer: Self-pay | Admitting: Family Medicine

## 2018-06-11 DIAGNOSIS — E039 Hypothyroidism, unspecified: Secondary | ICD-10-CM

## 2018-06-11 DIAGNOSIS — I10 Essential (primary) hypertension: Secondary | ICD-10-CM

## 2018-06-15 ENCOUNTER — Ambulatory Visit: Payer: Self-pay | Admitting: Family Medicine

## 2018-08-20 ENCOUNTER — Other Ambulatory Visit: Payer: Self-pay | Admitting: Family Medicine

## 2018-08-20 DIAGNOSIS — I10 Essential (primary) hypertension: Secondary | ICD-10-CM

## 2018-09-13 ENCOUNTER — Encounter: Payer: Self-pay | Admitting: Family Medicine

## 2018-09-13 ENCOUNTER — Ambulatory Visit (INDEPENDENT_AMBULATORY_CARE_PROVIDER_SITE_OTHER): Payer: Medicare Other | Admitting: Family Medicine

## 2018-09-13 VITALS — BP 130/62 | HR 68 | Ht 62.0 in | Wt 134.0 lb

## 2018-09-13 DIAGNOSIS — E860 Dehydration: Secondary | ICD-10-CM | POA: Diagnosis not present

## 2018-09-13 DIAGNOSIS — J0100 Acute maxillary sinusitis, unspecified: Secondary | ICD-10-CM | POA: Diagnosis not present

## 2018-09-13 MED ORDER — AMOXICILLIN 500 MG PO CAPS: 500.0000 mg | ORAL_CAPSULE | Freq: Three times a day (TID) | ORAL | 0 refills | Status: DC

## 2018-09-13 NOTE — Progress Notes (Signed)
Date:  09/13/2018   Name:  Carly Marshall   DOB:  June 03, 1936   MRN:  [id number]   Chief Complaint: Sinusitis (congestion, drainage, cough with no production, no appetite for 2 days)  Sinusitis  This is a new problem. The current episode started in the past 7 days (middle last week). The problem has been gradually worsening since onset. There has been no fever (low grade). The pain is moderate. Associated symptoms include congestion, coughing, diaphoresis, ear pain, sinus pressure, sneezing and a sore throat. Pertinent negatives include no chills, headaches, hoarse voice, neck pain, shortness of breath or swollen glands. Past treatments include nothing.    Review of Systems  Constitutional: Positive for diaphoresis. Negative for chills, fatigue, fever and unexpected weight change.  HENT: Positive for congestion, ear pain, postnasal drip, sinus pressure, sneezing and sore throat. Negative for ear discharge, hoarse voice, nosebleeds, rhinorrhea and sinus pain.   Eyes: Negative for photophobia, pain, discharge, redness and itching.  Respiratory: Positive for cough. Negative for choking, chest tightness, shortness of breath, wheezing and stridor.   Cardiovascular: Negative for chest pain, palpitations and leg swelling.  Gastrointestinal: Negative for abdominal pain, blood in stool, constipation, diarrhea, nausea and vomiting.  Endocrine: Negative for cold intolerance, heat intolerance, polydipsia, polyphagia and polyuria.  Genitourinary: Negative for dysuria, flank pain, frequency, hematuria, menstrual problem, pelvic pain, urgency, vaginal bleeding and vaginal discharge.  Musculoskeletal: Negative for arthralgias, back pain, myalgias and neck pain.  Skin: Negative for rash.  Allergic/Immunologic: Negative for environmental allergies and food allergies.  Neurological: Negative for dizziness, weakness, light-headedness, numbness and headaches.  Hematological: Negative for adenopathy. Does not  bruise/bleed easily.  Psychiatric/Behavioral: Negative for dysphoric mood. The patient is not nervous/anxious.     Patient Active Problem List   Diagnosis Date Noted  . B12 deficiency 04/28/2016  . Essential hypertension 03/27/2015  . Thyroid activity decreased 03/27/2015  . Hyperlipidemia 03/27/2015  . Allergic rhinitis due to pollen 03/27/2015    No Known Allergies  Past Surgical History:  Procedure Laterality Date  . COLONOSCOPY  2012   normal- Dr Sonny Masters  . ECTOPIC PREGNANCY SURGERY      Social History   Tobacco Use  . Smoking status: Never Smoker  . Smokeless tobacco: Never Used  . Tobacco comment: smoking cessation materials not required  Substance Use Topics  . Alcohol use: No    Alcohol/week: 0.0 standard drinks  . Drug use: No     Medication list has been reviewed and updated.  Current Meds  Medication Sig  . Calcium Carb-Cholecalciferol (CALCIUM 600 + D PO) Take 1 capsule by mouth 2 (two) times daily.  . cyanocobalamin (,VITAMIN B-12,) 1000 MCG/ML injection INJECT 1 ML INTO THE MUSCLE EVERY 30 DAYS.  . fluticasone (FLONASE) 50 MCG/ACT nasal spray Place 2 sprays into both nostrils daily.  Marland Kitchen labetalol (NORMODYNE) 300 MG tablet TAKE 1 TABLET(300 MG) BY MOUTH DAILY  . levothyroxine (SYNTHROID, LEVOTHROID) 50 MCG tablet TAKE 1 TABLET(50 MCG) BY MOUTH DAILY  . losartan-hydrochlorothiazide (HYZAAR) 50-12.5 MG tablet Take 1 tablet by mouth daily.  . simvastatin (ZOCOR) 20 MG tablet Take 1 tablet (20 mg total) by mouth daily.  . vitamin C (ASCORBIC ACID) 500 MG tablet Take 500 mg by mouth daily.    PHQ 2/9 Scores 03/10/2018 03/02/2017 11/20/2016 11/05/2015  PHQ - 2 Score 0 0 0 0  PHQ- 9 Score 0 - - -    Physical Exam Vitals signs and nursing note reviewed.  Constitutional:  General: She is not in acute distress.    Appearance: She is not diaphoretic.  HENT:     Head: Normocephalic and atraumatic.     Jaw: There is normal jaw occlusion.     Right Ear:  Tympanic membrane, ear canal and external ear normal.     Left Ear: Tympanic membrane, ear canal and external ear normal.     Nose:     Right Sinus: No maxillary sinus tenderness or frontal sinus tenderness.     Left Sinus: Maxillary sinus tenderness present. No frontal sinus tenderness.     Mouth/Throat:     Mouth: Mucous membranes are dry.     Pharynx: Oropharynx is clear.  Eyes:     General:        Right eye: No discharge.        Left eye: No discharge.     Conjunctiva/sclera: Conjunctivae normal.     Pupils: Pupils are equal, round, and reactive to light.  Neck:     Musculoskeletal: Normal range of motion and neck supple.     Thyroid: No thyromegaly.     Vascular: No JVD.  Cardiovascular:     Rate and Rhythm: Normal rate and regular rhythm.     Heart sounds: Normal heart sounds. No murmur. No friction rub. No gallop.   Pulmonary:     Effort: Pulmonary effort is normal.     Breath sounds: Normal breath sounds. No wheezing or rhonchi.  Abdominal:     General: Bowel sounds are normal.     Palpations: Abdomen is soft. There is no mass.     Tenderness: There is no abdominal tenderness. There is no guarding.  Musculoskeletal: Normal range of motion.  Lymphadenopathy:     Cervical: No cervical adenopathy.  Skin:    General: Skin is warm and dry.  Neurological:     Mental Status: She is alert.     Deep Tendon Reflexes: Reflexes are normal and symmetric.     BP 130/62   Pulse 68   Ht 5\' 2"  (1.575 m)   Wt 134 lb (60.8 kg)   BMI 24.51 kg/m   Assessment and Plan: 1. Acute maxillary sinusitis, recurrence not specified Acute.  Sam is consistent with an acute sinus infection in the maxillary area will initiate amoxicillin 500 mg 3 times a day for 10 days. - amoxicillin (AMOXIL) 500 MG capsule; Take 1 capsule (500 mg total) by mouth 3 (three) times daily.  Dispense: 30 capsule; Refill: 0  2. Dehydration Patient's exam is consistent with dehydration and have discussed need for  rehydration with fluid intake.

## 2018-09-17 DIAGNOSIS — N281 Cyst of kidney, acquired: Secondary | ICD-10-CM | POA: Diagnosis not present

## 2018-09-17 DIAGNOSIS — N183 Chronic kidney disease, stage 3 (moderate): Secondary | ICD-10-CM | POA: Diagnosis not present

## 2018-09-17 DIAGNOSIS — I129 Hypertensive chronic kidney disease with stage 1 through stage 4 chronic kidney disease, or unspecified chronic kidney disease: Secondary | ICD-10-CM | POA: Diagnosis not present

## 2018-09-17 LAB — BASIC METABOLIC PANEL
BUN: 16 (ref 4–21)
Creatinine: 1.8 — AB (ref 0.5–1.1)
Glucose: 97
Potassium: 3.6 (ref 3.4–5.3)
Sodium: 141 (ref 137–147)

## 2018-09-17 LAB — CBC AND DIFFERENTIAL
HCT: 32 — AB (ref 36–46)
Hemoglobin: 10.6 — AB (ref 12.0–16.0)
Platelets: 255 (ref 150–399)

## 2018-09-24 ENCOUNTER — Other Ambulatory Visit: Payer: Self-pay

## 2018-10-07 DIAGNOSIS — N184 Chronic kidney disease, stage 4 (severe): Secondary | ICD-10-CM | POA: Diagnosis not present

## 2018-10-07 DIAGNOSIS — I129 Hypertensive chronic kidney disease with stage 1 through stage 4 chronic kidney disease, or unspecified chronic kidney disease: Secondary | ICD-10-CM | POA: Diagnosis not present

## 2018-10-11 ENCOUNTER — Other Ambulatory Visit: Payer: Self-pay

## 2018-10-11 DIAGNOSIS — I10 Essential (primary) hypertension: Secondary | ICD-10-CM

## 2018-10-11 MED ORDER — HYDROCHLOROTHIAZIDE 12.5 MG PO TABS: 12.5000 mg | ORAL_TABLET | Freq: Every day | ORAL | 3 refills | Status: DC

## 2018-10-11 MED ORDER — LOSARTAN POTASSIUM 50 MG PO TABS: 50.0000 mg | ORAL_TABLET | Freq: Every day | ORAL | 3 refills | Status: DC

## 2018-11-02 ENCOUNTER — Other Ambulatory Visit: Payer: Self-pay | Admitting: Family Medicine

## 2018-11-02 DIAGNOSIS — E782 Mixed hyperlipidemia: Secondary | ICD-10-CM

## 2018-11-04 ENCOUNTER — Other Ambulatory Visit: Payer: Self-pay

## 2018-11-04 ENCOUNTER — Ambulatory Visit (INDEPENDENT_AMBULATORY_CARE_PROVIDER_SITE_OTHER): Payer: Medicare Other | Admitting: Family Medicine

## 2018-11-04 ENCOUNTER — Encounter: Payer: Self-pay | Admitting: Family Medicine

## 2018-11-04 ENCOUNTER — Ambulatory Visit: Payer: Self-pay | Admitting: Family Medicine

## 2018-11-04 VITALS — BP 122/70 | HR 60 | Ht 62.0 in | Wt 132.0 lb

## 2018-11-04 DIAGNOSIS — J3089 Other allergic rhinitis: Secondary | ICD-10-CM | POA: Diagnosis not present

## 2018-11-04 DIAGNOSIS — R69 Illness, unspecified: Secondary | ICD-10-CM

## 2018-11-04 DIAGNOSIS — E538 Deficiency of other specified B group vitamins: Secondary | ICD-10-CM | POA: Diagnosis not present

## 2018-11-04 DIAGNOSIS — I10 Essential (primary) hypertension: Secondary | ICD-10-CM

## 2018-11-04 DIAGNOSIS — E039 Hypothyroidism, unspecified: Secondary | ICD-10-CM

## 2018-11-04 DIAGNOSIS — E782 Mixed hyperlipidemia: Secondary | ICD-10-CM

## 2018-11-04 MED ORDER — LEVOTHYROXINE SODIUM 50 MCG PO TABS: ORAL_TABLET | ORAL | 1 refills | Status: DC

## 2018-11-04 MED ORDER — SIMVASTATIN 20 MG PO TABS: ORAL_TABLET | ORAL | 1 refills | Status: DC

## 2018-11-04 MED ORDER — LABETALOL HCL 300 MG PO TABS: ORAL_TABLET | ORAL | 1 refills | Status: DC

## 2018-11-04 MED ORDER — FLUTICASONE PROPIONATE 50 MCG/ACT NA SUSP: 2.0000 | Freq: Every day | NASAL | 11 refills | Status: DC

## 2018-11-04 NOTE — Progress Notes (Signed)
Date:  11/04/2018   Name:  Carly Marshall   DOB:  12/09/1935   MRN:  [id number]   Chief Complaint: b12 def; Hypertension; Hypothyroidism; Hyperlipidemia; and Allergic Rhinitis   Hypertension  This is a chronic problem. The current episode started more than 1 year ago. The problem is unchanged. The problem is controlled. Pertinent negatives include no anxiety, blurred vision, chest pain, headaches, malaise/fatigue, neck pain, orthopnea, palpitations, peripheral edema, PND, shortness of breath or sweats. There are no associated agents to hypertension. There are no known risk factors for coronary artery disease. Past treatments include beta blockers, diuretics and angiotensin blockers. The current treatment provides moderate improvement. There are no compliance problems.  There is no history of angina, kidney disease, CAD/MI, CVA, heart failure, left ventricular hypertrophy, PVD or retinopathy. Identifiable causes of hypertension include a thyroid problem. There is no history of chronic renal disease, a hypertension causing med or renovascular disease.  Hyperlipidemia  This is a chronic problem. The current episode started more than 1 year ago. The problem is controlled. Recent lipid tests were reviewed and are normal. She has no history of chronic renal disease, diabetes, hypothyroidism, liver disease, obesity or nephrotic syndrome. There are no known factors aggravating her hyperlipidemia. Pertinent negatives include no chest pain, focal sensory loss, leg pain, myalgias or shortness of breath. Current antihyperlipidemic treatment includes statins. The current treatment provides moderate improvement of lipids. There are no compliance problems.  Risk factors for coronary artery disease include hypertension, dyslipidemia and post-menopausal.  Thyroid Problem  Presents for follow-up visit. Patient reports no anxiety, cold intolerance, constipation, depressed mood, diaphoresis, diarrhea, dry skin,  fatigue, hair loss, heat intolerance, hoarse voice, leg swelling, menstrual problem, nail problem, palpitations, tremors, visual change, weight gain or weight loss. The symptoms have been stable. Her past medical history is significant for hyperlipidemia. There is no history of diabetes or heart failure.    Review of Systems  Constitutional: Negative.  Negative for chills, diaphoresis, fatigue, fever, malaise/fatigue, unexpected weight change, weight gain and weight loss.  HENT: Negative for congestion, ear discharge, ear pain, hoarse voice, rhinorrhea, sinus pressure, sneezing and sore throat.   Eyes: Negative for blurred vision, photophobia, pain, discharge, redness and itching.  Respiratory: Negative for cough, shortness of breath, wheezing and stridor.   Cardiovascular: Negative for chest pain, palpitations, orthopnea and PND.  Gastrointestinal: Negative for abdominal pain, blood in stool, constipation, diarrhea, nausea and vomiting.  Endocrine: Negative for cold intolerance, heat intolerance, polydipsia, polyphagia and polyuria.  Genitourinary: Negative for dysuria, flank pain, frequency, hematuria, menstrual problem, pelvic pain, urgency, vaginal bleeding and vaginal discharge.  Musculoskeletal: Negative for arthralgias, back pain, myalgias and neck pain.  Skin: Negative for rash.  Allergic/Immunologic: Negative for environmental allergies and food allergies.  Neurological: Negative for dizziness, tremors, weakness, light-headedness, numbness and headaches.  Hematological: Negative for adenopathy. Does not bruise/bleed easily.  Psychiatric/Behavioral: Negative for dysphoric mood. The patient is not nervous/anxious.     Patient Active Problem List   Diagnosis Date Noted  . B12 deficiency 04/28/2016  . Essential hypertension 03/27/2015  . Thyroid activity decreased 03/27/2015  . Hyperlipidemia 03/27/2015  . Allergic rhinitis due to pollen 03/27/2015    No Known Allergies  Past  Surgical History:  Procedure Laterality Date  . COLONOSCOPY  2012   normal- Dr Sonny Masters  . ECTOPIC PREGNANCY SURGERY      Social History   Tobacco Use  . Smoking status: Never Smoker  . Smokeless tobacco: Never Used  .  Tobacco comment: smoking cessation materials not required  Substance Use Topics  . Alcohol use: No    Alcohol/week: 0.0 standard drinks  . Drug use: No     Medication list has been reviewed and updated.  Current Meds  Medication Sig  . Calcium Carb-Cholecalciferol (CALCIUM 600 + D PO) Take 1 capsule by mouth 2 (two) times daily.  . cyanocobalamin (,VITAMIN B-12,) 1000 MCG/ML injection INJECT 1 ML INTO THE MUSCLE EVERY 30 DAYS.  . fluticasone (FLONASE) 50 MCG/ACT nasal spray Place 2 sprays into both nostrils daily.  . hydrochlorothiazide (HYDRODIURIL) 12.5 MG tablet Take 1 tablet (12.5 mg total) by mouth daily.  Marland Kitchen labetalol (NORMODYNE) 300 MG tablet TAKE 1 TABLET(300 MG) BY MOUTH DAILY  . levothyroxine (SYNTHROID, LEVOTHROID) 50 MCG tablet TAKE 1 TABLET(50 MCG) BY MOUTH DAILY  . losartan (COZAAR) 50 MG tablet Take 1 tablet (50 mg total) by mouth daily.  . simvastatin (ZOCOR) 20 MG tablet TAKE 1 TABLET(20 MG) BY MOUTH DAILY  . vitamin C (ASCORBIC ACID) 500 MG tablet Take 500 mg by mouth daily.    PHQ 2/9 Scores 11/04/2018 03/10/2018 03/02/2017 11/20/2016  PHQ - 2 Score 0 0 0 0  PHQ- 9 Score 0 0 - -    Physical Exam Vitals signs and nursing note reviewed.  Constitutional:      General: She is not in acute distress.    Appearance: She is not diaphoretic.  HENT:     Head: Normocephalic and atraumatic.     Right Ear: External ear normal.     Left Ear: External ear normal.     Nose: Nose normal.  Eyes:     General:        Right eye: No discharge.        Left eye: No discharge.     Conjunctiva/sclera: Conjunctivae normal.     Pupils: Pupils are equal, round, and reactive to light.  Neck:     Musculoskeletal: Normal range of motion and neck supple.      Thyroid: No thyromegaly.     Vascular: No JVD.  Cardiovascular:     Rate and Rhythm: Normal rate and regular rhythm.     Chest Wall: PMI is not displaced. No thrill.     Pulses: Normal pulses.     Heart sounds: Normal heart sounds, S1 normal and S2 normal. No murmur. No systolic murmur. No diastolic murmur. No friction rub. No gallop. No S3 or S4 sounds.   Pulmonary:     Effort: Pulmonary effort is normal.     Breath sounds: Normal breath sounds.  Abdominal:     General: Bowel sounds are normal.     Palpations: Abdomen is soft. There is no mass.     Tenderness: There is no abdominal tenderness. There is no guarding.  Musculoskeletal: Normal range of motion.     Right lower leg: No edema.     Left lower leg: No edema.  Lymphadenopathy:     Cervical: No cervical adenopathy.  Skin:    General: Skin is warm and dry.  Neurological:     Mental Status: She is alert.     Deep Tendon Reflexes: Reflexes are normal and symmetric.     Wt Readings from Last 3 Encounters:  11/04/18 132 lb (59.9 kg)  09/13/18 134 lb (60.8 kg)  05/04/18 132 lb (59.9 kg)    BP 122/70   Pulse 60   Ht 5\' 2"  (1.575 m)   Wt 132 lb (59.9 kg)  BMI 24.14 kg/m   Assessment and Plan:  1. Non-seasonal allergic rhinitis, unspecified trigger Patient has been having nonproductive upper respiratory clearing, rhinorrhea, conjunctivitis and will pick up fluticasone by prescription. - fluticasone (FLONASE) 50 MCG/ACT nasal spray; Place 2 sprays into both nostrils daily.  Dispense: 16 g; Refill: 11  2. Essential hypertension Chronic.  Controlled.  Continue labetalol 300 mg once a day prescription called in patient had losartan and hydrochlorothiazide available still.  Will check a creatinine at this time. - Creatinine - labetalol (NORMODYNE) 300 MG tablet; TAKE 1 TABLET(300 MG) BY MOUTH DAILY  Dispense: 90 tablet; Refill: 1  3. Hypothyroidism, unspecified type Chronic.  Controlled.  Continue levothyroxine 50 mcg  daily and will check a TSH and adjust as needed. - TSH - levothyroxine (SYNTHROID, LEVOTHROID) 50 MCG tablet; TAKE 1 TABLET(50 MCG) BY MOUTH DAILY  Dispense: 90 tablet; Refill: 1  4. Mixed hyperlipidemia Chronic controlled.  Continue simvastatin 20 mg once a day and will check lipid panel. - Lipid Panel With LDL/HDL Ratio - simvastatin (ZOCOR) 20 MG tablet; TAKE 1 TABLET(20 MG) BY MOUTH DAILY  Dispense: 90 tablet; Refill: 1  5. Taking medication for chronic disease Will check hepatic panel because of statin. - Hepatic function panel  6. B12 deficiency With history of B12 deficiency will continue B12 injection 1000 mcg/mL in patient was administered by muscular today. - B12

## 2018-11-05 LAB — LIPID PANEL WITH LDL/HDL RATIO
Cholesterol, Total: 125 mg/dL (ref 100–199)
HDL: 52 mg/dL (ref >39)
LDL Calculated: 53 mg/dL (ref 0–99)
LDl/HDL Ratio: 1 ratio (ref 0.0–3.2)
Triglycerides: 100 mg/dL (ref 0–149)
VLDL Cholesterol Cal: 20 mg/dL (ref 5–40)

## 2018-11-05 LAB — CREATININE, SERUM
Creatinine, Ser: 1.61 mg/dL — ABNORMAL HIGH (ref 0.57–1.00)
GFR calc Af Amer: 35 mL/min/{1.73_m2} — ABNORMAL LOW (ref >59)
GFR calc non Af Amer: 30 mL/min/{1.73_m2} — ABNORMAL LOW (ref >59)

## 2018-11-05 LAB — HEPATIC FUNCTION PANEL
ALT: 13 IU/L (ref 0–32)
AST: 17 IU/L (ref 0–40)
Albumin: 4.4 g/dL (ref 3.7–4.7)
Alkaline Phosphatase: 89 IU/L (ref 39–117)
Bilirubin Total: 0.4 mg/dL (ref 0.0–1.2)
Bilirubin, Direct: 0.17 mg/dL (ref 0.00–0.40)
Total Protein: 6.5 g/dL (ref 6.0–8.5)

## 2018-11-05 LAB — TSH: TSH: 2.33 u[IU]/mL (ref 0.450–4.500)

## 2018-11-05 LAB — VITAMIN B12: Vitamin B-12: 479 pg/mL (ref 232–1245)

## 2019-02-07 ENCOUNTER — Other Ambulatory Visit: Payer: Self-pay

## 2019-02-07 ENCOUNTER — Observation Stay
Admission: EM | Admit: 2019-02-07 | Discharge: 2019-02-08 | Disposition: A | Discharge status: Home / Self Care | Payer: Medicare Other | Attending: Internal Medicine | Admitting: Internal Medicine

## 2019-02-07 ENCOUNTER — Emergency Department: Payer: Medicare Other

## 2019-02-07 DIAGNOSIS — R4701 Aphasia: Secondary | ICD-10-CM | POA: Diagnosis not present

## 2019-02-07 DIAGNOSIS — N183 Chronic kidney disease, stage 3 unspecified: Secondary | ICD-10-CM | POA: Diagnosis present

## 2019-02-07 DIAGNOSIS — E039 Hypothyroidism, unspecified: Secondary | ICD-10-CM | POA: Diagnosis not present

## 2019-02-07 DIAGNOSIS — Z1159 Encounter for screening for other viral diseases: Secondary | ICD-10-CM | POA: Diagnosis not present

## 2019-02-07 DIAGNOSIS — R29810 Facial weakness: Secondary | ICD-10-CM | POA: Diagnosis not present

## 2019-02-07 DIAGNOSIS — G450 Vertebro-basilar artery syndrome: Secondary | ICD-10-CM | POA: Diagnosis not present

## 2019-02-07 DIAGNOSIS — Z7951 Long term (current) use of inhaled steroids: Secondary | ICD-10-CM | POA: Diagnosis not present

## 2019-02-07 DIAGNOSIS — E785 Hyperlipidemia, unspecified: Secondary | ICD-10-CM | POA: Diagnosis not present

## 2019-02-07 DIAGNOSIS — R410 Disorientation, unspecified: Secondary | ICD-10-CM | POA: Diagnosis not present

## 2019-02-07 DIAGNOSIS — M8580 Other specified disorders of bone density and structure, unspecified site: Secondary | ICD-10-CM | POA: Diagnosis not present

## 2019-02-07 DIAGNOSIS — N184 Chronic kidney disease, stage 4 (severe): Secondary | ICD-10-CM | POA: Diagnosis present

## 2019-02-07 DIAGNOSIS — Z20828 Contact with and (suspected) exposure to other viral communicable diseases: Secondary | ICD-10-CM | POA: Diagnosis not present

## 2019-02-07 DIAGNOSIS — G459 Transient cerebral ischemic attack, unspecified: Principal | ICD-10-CM | POA: Insufficient documentation

## 2019-02-07 DIAGNOSIS — I10 Essential (primary) hypertension: Secondary | ICD-10-CM | POA: Diagnosis not present

## 2019-02-07 DIAGNOSIS — R4781 Slurred speech: Secondary | ICD-10-CM

## 2019-02-07 DIAGNOSIS — E079 Disorder of thyroid, unspecified: Secondary | ICD-10-CM | POA: Insufficient documentation

## 2019-02-07 DIAGNOSIS — M6281 Muscle weakness (generalized): Secondary | ICD-10-CM | POA: Diagnosis not present

## 2019-02-07 DIAGNOSIS — R9431 Abnormal electrocardiogram [ECG] [EKG]: Secondary | ICD-10-CM | POA: Diagnosis not present

## 2019-02-07 DIAGNOSIS — Z79899 Other long term (current) drug therapy: Secondary | ICD-10-CM | POA: Insufficient documentation

## 2019-02-07 DIAGNOSIS — I129 Hypertensive chronic kidney disease with stage 1 through stage 4 chronic kidney disease, or unspecified chronic kidney disease: Secondary | ICD-10-CM | POA: Diagnosis not present

## 2019-02-07 DIAGNOSIS — R52 Pain, unspecified: Secondary | ICD-10-CM | POA: Diagnosis not present

## 2019-02-07 DIAGNOSIS — Z79890 Hormone replacement therapy (postmenopausal): Secondary | ICD-10-CM | POA: Diagnosis not present

## 2019-02-07 LAB — COMPREHENSIVE METABOLIC PANEL
ALT: 13 U/L (ref 0–44)
AST: 18 U/L (ref 15–41)
Albumin: 4.1 g/dL (ref 3.5–5.0)
Alkaline Phosphatase: 74 U/L (ref 38–126)
Anion gap: 7 (ref 5–15)
BUN: 29 mg/dL — ABNORMAL HIGH (ref 8–23)
CO2: 27 mmol/L (ref 22–32)
Calcium: 9.4 mg/dL (ref 8.9–10.3)
Chloride: 105 mmol/L (ref 98–111)
Creatinine, Ser: 1.47 mg/dL — ABNORMAL HIGH (ref 0.44–1.00)
GFR calc Af Amer: 39 mL/min — ABNORMAL LOW (ref >60)
GFR calc non Af Amer: 33 mL/min — ABNORMAL LOW (ref >60)
Glucose, Bld: 99 mg/dL (ref 70–99)
Potassium: 4 mmol/L (ref 3.5–5.1)
Sodium: 139 mmol/L (ref 135–145)
Total Bilirubin: 0.7 mg/dL (ref 0.3–1.2)
Total Protein: 7 g/dL (ref 6.5–8.1)

## 2019-02-07 LAB — DIFFERENTIAL
Abs Immature Granulocytes: 0.01 10*3/uL (ref 0.00–0.07)
Basophils Absolute: 0 10*3/uL (ref 0.0–0.1)
Basophils Relative: 1 %
Eosinophils Absolute: 0.2 10*3/uL (ref 0.0–0.5)
Eosinophils Relative: 4 %
Immature Granulocytes: 0 %
Lymphocytes Relative: 22 %
Lymphs Abs: 1.3 10*3/uL (ref 0.7–4.0)
Monocytes Absolute: 0.5 10*3/uL (ref 0.1–1.0)
Monocytes Relative: 8 %
Neutro Abs: 3.7 10*3/uL (ref 1.7–7.7)
Neutrophils Relative %: 65 %

## 2019-02-07 LAB — GLUCOSE, CAPILLARY: Glucose-Capillary: 85 mg/dL (ref 70–99)

## 2019-02-07 LAB — CBC
HCT: 33.9 % — ABNORMAL LOW (ref 36.0–46.0)
Hemoglobin: 10.7 g/dL — ABNORMAL LOW (ref 12.0–15.0)
MCH: 29.9 pg (ref 26.0–34.0)
MCHC: 31.6 g/dL (ref 30.0–36.0)
MCV: 94.7 fL (ref 80.0–100.0)
Platelets: 196 10*3/uL (ref 150–400)
RBC: 3.58 MIL/uL — ABNORMAL LOW (ref 3.87–5.11)
RDW: 13.2 % (ref 11.5–15.5)
WBC: 5.7 10*3/uL (ref 4.0–10.5)
nRBC: 0 % (ref 0.0–0.2)

## 2019-02-07 LAB — PROTIME-INR
INR: 1.1 (ref 0.8–1.2)
Prothrombin Time: 13.7 seconds (ref 11.4–15.2)

## 2019-02-07 LAB — APTT: aPTT: 57 seconds — ABNORMAL HIGH (ref 24–36)

## 2019-02-07 MED ORDER — ASPIRIN 81 MG PO CHEW
324.0000 mg | CHEWABLE_TABLET | Freq: Once | ORAL | Status: AC
  Administered 2019-02-07: 324 mg via ORAL
  Filled 2019-02-07: qty 4

## 2019-02-07 MED ORDER — LEVOTHYROXINE SODIUM 50 MCG PO TABS
50.0000 ug | ORAL_TABLET | Freq: Every day | ORAL | Status: DC
  Administered 2019-02-08: 50 ug via ORAL
  Filled 2019-02-07: qty 1

## 2019-02-07 MED ORDER — STROKE: EARLY STAGES OF RECOVERY BOOK
Freq: Once | Status: AC
  Administered 2019-02-08: 01:00:00

## 2019-02-07 MED ORDER — ACETAMINOPHEN 325 MG PO TABS
650.0000 mg | ORAL_TABLET | ORAL | Status: DC | PRN
  Administered 2019-02-08: 650 mg via ORAL
  Filled 2019-02-07: qty 2

## 2019-02-07 MED ORDER — ACETAMINOPHEN 650 MG RE SUPP: 650.0000 mg | RECTAL | Status: DC | PRN

## 2019-02-07 MED ORDER — ACETAMINOPHEN 160 MG/5ML PO SOLN
650.0000 mg | ORAL | Status: DC | PRN
  Filled 2019-02-07: qty 20.3

## 2019-02-07 MED ORDER — HYDRALAZINE HCL 20 MG/ML IJ SOLN
5.0000 mg | Freq: Once | INTRAMUSCULAR | Status: AC
  Administered 2019-02-07: 5 mg via INTRAVENOUS
  Filled 2019-02-07: qty 1

## 2019-02-07 MED ORDER — HYDRALAZINE HCL 20 MG/ML IJ SOLN
5.0000 mg | Freq: Once | INTRAMUSCULAR | Status: AC
  Administered 2019-02-07: 5 mg via INTRAVENOUS

## 2019-02-07 MED ORDER — SIMVASTATIN 20 MG PO TABS: 20.0000 mg | ORAL_TABLET | Freq: Every day | ORAL | Status: DC

## 2019-02-07 MED ORDER — HEPARIN SODIUM (PORCINE) 5000 UNIT/ML IJ SOLN
5000.0000 [IU] | Freq: Three times a day (TID) | INTRAMUSCULAR | Status: DC
  Administered 2019-02-08 (×2): 5000 [IU] via SUBCUTANEOUS
  Filled 2019-02-07 (×2): qty 1

## 2019-02-07 NOTE — ED Triage Notes (Signed)
Pt arrives [facility] from home for stroke like symptoms. Arrived from home. At 6:10 daughter noticed slurred speech and confusion. R sided facial droop noted. Speech clear. No arm or leg drift. Hands bilat numb. Sensation intact.   EMS VS 202/110, HR 60, CBG 94

## 2019-02-07 NOTE — H&P (Signed)
[place] [facility] [place] NAME: Carly Marshall    MR#:  [id number]  DATE OF BIRTH:  Feb 05, 1936  DATE OF ADMISSION:  02/07/2019  PRIMARY CARE PHYSICIAN: Juline Patch, MD   REQUESTING/REFERRING PHYSICIAN: Cherylann Banas, MD  CHIEF COMPLAINT:   Chief Complaint  Patient presents with  . Altered Mental Status  . Hypertension    HISTORY OF PRESENT ILLNESS:  Carly Marshall  is a 83 y.o. female who presents with chief complaint as above.  Patient presents to the ED with complaint of an episode of aphasia.  She describes an episode where she was unable to say the words that she thought in her head.,  She states that "they came out all in a mixed up jumble".  Per family's report she had slurred speech and some facial droop.  Here in the ED her symptoms resolved.  Given the concern for stroke hospitalist were called for admission  PAST MEDICAL HISTORY:   Past Medical History:  Diagnosis Date  . Allergy   . B12 deficiency anemia   . Hyperlipidemia   . Hypertension   . Thyroid disease      PAST SURGICAL HISTORY:   Past Surgical History:  Procedure Laterality Date  . COLONOSCOPY  2012   normal- Dr Sonny Masters  . ECTOPIC PREGNANCY SURGERY       SOCIAL HISTORY:   Social History   Tobacco Use  . Smoking status: Never Smoker  . Smokeless tobacco: Never Used  . Tobacco comment: smoking cessation materials not required  Substance Use Topics  . Alcohol use: No    Alcohol/week: 0.0 standard drinks     FAMILY HISTORY:   Family History  Problem Relation Age of Onset  . Diabetes Mother   . Heart disease Mother   . Fibromyalgia Mother   . Heart disease Father   . Hyperlipidemia Father   . Hypertension Father   . Diabetes Sister   . Brain cancer Brother   . Rheum arthritis Sister      DRUG ALLERGIES:  No Known Allergies  MEDICATIONS AT HOME:   Prior to Admission medications   Medication Sig Start Date End Date Taking?  Authorizing Provider  Calcium Carb-Cholecalciferol (CALCIUM 600 + D PO) Take 1 capsule by mouth 2 (two) times daily.    [provider]  cyanocobalamin (,VITAMIN B-12,) 1000 MCG/ML injection INJECT 1 ML INTO THE MUSCLE EVERY 30 DAYS. 05/04/18   Juline Patch, MD  fluticasone (FLONASE) 50 MCG/ACT nasal spray Place 2 sprays into both nostrils daily. 11/04/18   Juline Patch, MD  hydrochlorothiazide (HYDRODIURIL) 12.5 MG tablet Take 1 tablet (12.5 mg total) by mouth daily. 10/11/18   Juline Patch, MD  labetalol (NORMODYNE) 300 MG tablet TAKE 1 TABLET(300 MG) BY MOUTH DAILY 11/04/18   Juline Patch, MD  levothyroxine (SYNTHROID, LEVOTHROID) 50 MCG tablet TAKE 1 TABLET(50 MCG) BY MOUTH DAILY 11/04/18   Juline Patch, MD  losartan (COZAAR) 50 MG tablet Take 1 tablet (50 mg total) by mouth daily. 10/11/18   Juline Patch, MD  simvastatin (ZOCOR) 20 MG tablet TAKE 1 TABLET(20 MG) BY MOUTH DAILY 11/04/18   Juline Patch, MD  vitamin C (ASCORBIC ACID) 500 MG tablet Take 500 mg by mouth daily.    [provider]    REVIEW OF SYSTEMS:  Review of Systems  Constitutional: Negative for chills, fever, malaise/fatigue and weight loss.  HENT: Negative for ear pain, hearing  loss and tinnitus.   Eyes: Negative for blurred vision, double vision, pain and redness.  Respiratory: Negative for cough, hemoptysis and shortness of breath.   Cardiovascular: Negative for chest pain, palpitations, orthopnea and leg swelling.  Gastrointestinal: Negative for abdominal pain, constipation, diarrhea, nausea and vomiting.  Genitourinary: Negative for dysuria, frequency and hematuria.  Musculoskeletal: Negative for back pain, joint pain and neck pain.  Skin:       No acne, rash, or lesions  Neurological: Positive for speech change and focal weakness. Negative for dizziness, tremors and weakness.  Endo/Heme/Allergies: Negative for polydipsia. Does not bruise/bleed easily.  Psychiatric/Behavioral: Negative  for depression. The patient is not nervous/anxious and does not have insomnia.      VITAL SIGNS:   Vitals:   02/07/19 1930 02/07/19 1946 02/07/19 2000 02/07/19 2030  BP: (!) 205/72 (!) 205/72 (!) 189/74 (!) 177/94  Pulse: (!) 59  68 71  Resp: 19  14 14   Temp:      TempSrc:      SpO2: 95%  98% 95%  Weight:      Height:       Wt Readings from Last 3 Encounters:  02/07/19 61.2 kg  11/04/18 59.9 kg  09/13/18 60.8 kg    PHYSICAL EXAMINATION:  Physical Exam  Vitals reviewed. Constitutional: She is oriented to person, place, and time. She appears well-developed and well-nourished. No distress.  HENT:  Head: Normocephalic and atraumatic.  Mouth/Throat: Oropharynx is clear and moist.  Eyes: Pupils are equal, round, and reactive to light. Conjunctivae and EOM are normal. No scleral icterus.  Neck: Normal range of motion. Neck supple. No JVD present. No thyromegaly present.  Cardiovascular: Normal rate, regular rhythm and intact distal pulses. Exam reveals no gallop and no friction rub.  No murmur heard. Respiratory: Effort normal and breath sounds normal. No respiratory distress. She has no wheezes. She has no rales.  GI: Soft. Bowel sounds are normal. She exhibits no distension. There is no abdominal tenderness.  Musculoskeletal: Normal range of motion.        General: No edema.     Comments: No arthritis, no gout  Lymphadenopathy:    She has no cervical adenopathy.  Neurological: She is alert and oriented to person, place, and time. No cranial nerve deficit.  Neurologic: Cranial nerves II-XII intact, Sensation intact to light touch/pinprick, 5/5 strength in all extremities, no dysarthria, no aphasia, no dysphagia, memory intact, finger to nose testing showed no abnormality, no pronator drift  Skin: Skin is warm and dry. No rash noted. No erythema.  Psychiatric: She has a normal mood and affect. Her behavior is normal. Judgment and thought content normal.    LABORATORY PANEL:    CBC Recent Labs  Lab 02/07/19 1905  WBC 5.7  HGB 10.7*  HCT 33.9*  PLT 196   ------------------------------------------------------------------------------------------------------------------  Chemistries  Recent Labs  Lab 02/07/19 1905  NA 139  K 4.0  CL 105  CO2 27  GLUCOSE 99  BUN 29*  CREATININE 1.47*  CALCIUM 9.4  AST 18  ALT 13  ALKPHOS 74  BILITOT 0.7   ------------------------------------------------------------------------------------------------------------------  Cardiac Enzymes No results for input(s): TROPONINI in the last 168 hours. ------------------------------------------------------------------------------------------------------------------  RADIOLOGY:  Ct Head Code Stroke Wo Contrast`  Addendum Date: 02/07/2019   ADDENDUM REPORT: 02/07/2019 19:36 ADDENDUM: Study discussed by telephone with Dr. Arta Silence on 02/07/2019 at 1932 hours. Electronically Signed   By: Genevie Ann M.D.   On: 02/07/2019 19:36   Result Date:  02/07/2019 CLINICAL DATA:  Code stroke. 83 year old female with slurred speech and confusion with right facial droop onset 1810 hours. EXAM: CT HEAD WITHOUT CONTRAST TECHNIQUE: Contiguous axial images were obtained from the base of the skull through the vertex without intravenous contrast. COMPARISON:  None. FINDINGS: Brain: Patchy widespread bilateral cerebral white matter hypodensity. Cerebral volume is within normal limits for age. No ventriculomegaly. No midline shift, mass effect, or evidence of intracranial mass lesion. No acute intracranial hemorrhage identified. No cortically based acute infarct identified. No cortical encephalomalacia identified. Vascular: Calcified atherosclerosis at the skull base. Generalized intracranial artery dolichoectasia. No suspicious intracranial vascular hyperdensity. Skull: Hyperostosis, normal variant. No acute osseous abnormality identified. Sinuses/Orbits: Visible paranasal sinuses are clear. Mild or  trace left mastoid fluid. Right mastoids are clear. Other: No acute orbit or scalp soft tissue finding. ASPECTS [facility] Stroke Program Early CT Score) - Ganglionic level infarction (caudate, lentiform nuclei, internal capsule, insula, M1-M3 cortex): 7 - Supraganglionic infarction (M4-M6 cortex): 3 Total score (0-10 with 10 being normal): 10 IMPRESSION: 1. Advanced cerebral white matter changes most commonly due to small vessel disease, but no acute cortically based infarct or acute intracranial hemorrhage identified. 2. ASPECTS 10. 3. Generalized intracranial artery dolichoectasia. Electronically Signed: By: Genevie Ann M.D. On: 02/07/2019 19:30    EKG:   Orders placed or performed during the hospital encounter of 02/07/19  . ED EKG  . ED EKG  . EKG 12-Lead  . EKG 12-Lead    IMPRESSION AND PLAN:  Principal Problem:   Aphasia -admit per stroke admission order set with corresponding imaging, labs, consults, including MRI imaging and neurology consult Active Problems:   Essential hypertension -permissive hypertension tonight with blood pressure goal less than 220/120   Hypothyroidism -home dose thyroid replacement   Hyperlipidemia -home dose antilipid   CKD (chronic kidney disease), stage III ([place]) -at baseline, avoid nephrotoxins and monitor  Chart review performed and case discussed with ED provider. Labs, imaging and/or ECG reviewed by provider and discussed with patient/family. Management plans discussed with the patient and/or family.  COVID-19 status: Test pending  DVT PROPHYLAXIS: SubQ lovenox   GI PROPHYLAXIS:  None  ADMISSION STATUS: Observation  CODE STATUS: Full  TOTAL TIME TAKING CARE OF THIS PATIENT: 40 minutes.   This patient was evaluated in the context of the global COVID-19 pandemic, which necessitated consideration that the patient might be at risk for infection with the SARS-CoV-2 virus that causes COVID-19. Institutional protocols and algorithms that pertain to the  evaluation of patients at risk for COVID-19 are in a state of rapid change based on information released by regulatory bodies including the CDC and federal and state organizations. These policies and algorithms were followed to the best of this provider's knowledge to date during the patient's care at this facility.  Ethlyn Daniels 02/07/2019, 9:56 PM  [facility]  Office  [phone]  CC: Primary care physician; Juline Patch, MD  Note:  This document was prepared using Dragon voice recognition software and may include unintentional dictation errors.

## 2019-02-07 NOTE — ED Provider Notes (Signed)
[facility] Emergency Department Provider Note ____________________________________________   First MD Initiated Contact with Patient 02/07/19 1905     (approximate)  I have reviewed the triage vital signs and the nursing notes.   HISTORY  Chief Complaint Altered Mental Status and Hypertension    HPI Carly Marshall is a 83 y.o. female with PMH as noted below who presents with acute onset of confusion around 6:10 PM, associated with slurred speech as well as right facial droop.  The patient herself noted that she felt foggy.  She reports an associated mild frontal headache.  The confusion and slurred speech have resolved although she still has mild facial droop.  Past Medical History:  Diagnosis Date  . Allergy   . B12 deficiency anemia   . Hyperlipidemia   . Hypertension   . Thyroid disease     Patient Active Problem List   Diagnosis Date Noted  . Slurred speech 02/07/2019  . CKD (chronic kidney disease), stage III ([place]) 02/07/2019  . B12 deficiency 04/28/2016  . Essential hypertension 03/27/2015  . Hypothyroidism 03/27/2015  . Hyperlipidemia 03/27/2015  . Allergic rhinitis due to pollen 03/27/2015    Past Surgical History:  Procedure Laterality Date  . COLONOSCOPY  2012   normal- Dr Sonny Masters  . ECTOPIC PREGNANCY SURGERY      Prior to Admission medications   Medication Sig Start Date End Date Taking? Authorizing Provider  Calcium Carb-Cholecalciferol (CALCIUM 600 + D PO) Take 1 capsule by mouth 2 (two) times daily.    [provider]  cyanocobalamin (,VITAMIN B-12,) 1000 MCG/ML injection INJECT 1 ML INTO THE MUSCLE EVERY 30 DAYS. 05/04/18   Juline Patch, MD  fluticasone (FLONASE) 50 MCG/ACT nasal spray Place 2 sprays into both nostrils daily. 11/04/18   Juline Patch, MD  hydrochlorothiazide (HYDRODIURIL) 12.5 MG tablet Take 1 tablet (12.5 mg total) by mouth daily. 10/11/18   Juline Patch, MD  labetalol (NORMODYNE) 300 MG  tablet TAKE 1 TABLET(300 MG) BY MOUTH DAILY 11/04/18   Juline Patch, MD  levothyroxine (SYNTHROID, LEVOTHROID) 50 MCG tablet TAKE 1 TABLET(50 MCG) BY MOUTH DAILY 11/04/18   Juline Patch, MD  losartan (COZAAR) 50 MG tablet Take 1 tablet (50 mg total) by mouth daily. 10/11/18   Juline Patch, MD  simvastatin (ZOCOR) 20 MG tablet TAKE 1 TABLET(20 MG) BY MOUTH DAILY 11/04/18   Juline Patch, MD  vitamin C (ASCORBIC ACID) 500 MG tablet Take 500 mg by mouth daily.    [provider]    Allergies Patient has no known allergies.  Family History  Problem Relation Age of Onset  . Diabetes Mother   . Heart disease Mother   . Fibromyalgia Mother   . Heart disease Father   . Hyperlipidemia Father   . Hypertension Father   . Diabetes Sister   . Brain cancer Brother   . Rheum arthritis Sister     Social History Social History   Tobacco Use  . Smoking status: Never Smoker  . Smokeless tobacco: Never Used  . Tobacco comment: smoking cessation materials not required  Substance Use Topics  . Alcohol use: No    Alcohol/week: 0.0 standard drinks  . Drug use: No    Review of Systems  Constitutional: No fever. Eyes: No visual changes. ENT: No neck pain. Cardiovascular: Denies chest pain. Respiratory: Denies shortness of breath. Gastrointestinal: No nausea or vomiting. Genitourinary: Negative for dysuria.  Musculoskeletal: Negative for back pain.  Skin: Negative for rash. Neurological: Positive for headache.   ____________________________________________   PHYSICAL EXAM:  VITAL SIGNS: ED Triage Vitals  Enc Vitals Group     BP 02/07/19 1909 (!) 197/83     Pulse Rate 02/07/19 1906 (!) 58     Resp 02/07/19 1906 18     Temp 02/07/19 1906 98.2 F (36.8 C)     Temp Source 02/07/19 1906 Oral     SpO2 02/07/19 1906 97 %     Weight 02/07/19 1906 135 lb (61.2 kg)     Height 02/07/19 1906 5\' 2"  (1.575 m)     Head Circumference --      Peak Flow --      Pain Score  02/07/19 1906 9     Pain Loc --      Pain Edu? --      Excl. in [place]? --     Constitutional: Alert and oriented. Well appearing and in no acute distress. Eyes: Conjunctivae are normal.  EOMI.  PERRLA. Head: Atraumatic. Nose: No congestion/rhinnorhea. Mouth/Throat: Mucous membranes are moist.   Neck: Normal range of motion.  Cardiovascular: Normal rate, regular rhythm. Grossly normal heart sounds.  Good peripheral circulation. Respiratory: Normal respiratory effort.  No retractions. Lungs CTAB. Gastrointestinal: No distention.  Musculoskeletal: No lower extremity edema.  Extremities warm and well perfused.  Neurologic:  Normal speech and language.  Mild right facial droop involving the nasolabial fold.  Cranial nerves otherwise intact.  5/5 motor strength and intact sensation of bilateral upper and lower extremities.  No pronator drift.  Normal coordination with no ataxia. Skin:  Skin is warm and dry. No rash noted. Psychiatric: Mood and affect are normal. Speech and behavior are normal.  ____________________________________________   LABS (all labs ordered are listed, but only abnormal results are displayed)  Labs Reviewed  APTT - Abnormal; Notable for the following components:      Result Value   aPTT 57 (*)    All other components within normal limits  CBC - Abnormal; Notable for the following components:   RBC 3.58 (*)    Hemoglobin 10.7 (*)    HCT 33.9 (*)    All other components within normal limits  COMPREHENSIVE METABOLIC PANEL - Abnormal; Notable for the following components:   BUN 29 (*)    Creatinine, Ser 1.47 (*)    GFR calc non Af Amer 33 (*)    GFR calc Af Amer 39 (*)    All other components within normal limits  NOVEL CORONAVIRUS, NAA (HOSPITAL ORDER, SEND-OUT TO REF LAB)  GLUCOSE, CAPILLARY  PROTIME-INR  DIFFERENTIAL  TROPONIN I (HIGH SENSITIVITY)  TROPONIN I (HIGH SENSITIVITY)  CBG MONITORING, ED   ____________________________________________  EKG   ED ECG REPORT I, Arta Silence, the attending physician, personally viewed and interpreted this ECG.  Date: 02/07/2019 EKG Time: 1908 Rate: 58 Rhythm: normal sinus rhythm QRS Axis: normal Intervals: normal ST/T Wave abnormalities: normal Narrative Interpretation: Nonspecific abnormalities with no evidence of acute ischemia  ____________________________________________  RADIOLOGY  CT head: Chronic appearing small vessel changes with no acute abnormality  ____________________________________________   PROCEDURES  Procedure(s) performed: No  Procedures  Critical Care performed: Yes  CRITICAL CARE Performed by: Arta Silence   Total critical care time: 15 minutes  Critical care time was exclusive of separately billable procedures and treating other patients.  Critical care was necessary to treat or prevent imminent or life-threatening deterioration.  Critical care was time spent personally by me on the  following activities: development of treatment plan with patient and/or surrogate as well as nursing, discussions with consultants, evaluation of patient's response to treatment, examination of patient, obtaining history from patient or surrogate, ordering and performing treatments and interventions, ordering and review of laboratory studies, ordering and review of radiographic studies, pulse oximetry and re-evaluation of patient's condition. ____________________________________________   INITIAL IMPRESSION / ASSESSMENT AND PLAN / ED COURSE  Pertinent labs & imaging results that were available during my care of the patient were reviewed by me and considered in my medical decision making (see chart for details).  83 year old female with PMH as noted above presents with acute onset of slurred speech, confusion, and right facial droop around 6:10 PM which had almost completely resolved by the time of her arrival to the hospital.  She has no prior history of this.   On my exam, the patient had an elevated blood pressure with otherwise normal vital signs and was generally well-appearing.  She had very mild right facial droop involving the nasolabial fold but no other neurologic deficits.  Although she presented about an hour after the symptom onset given that the symptoms are almost completely resolved, there is no indication for code stroke activation.  We obtained urgent CT which is negative for acute findings.  Overall presentation is most consistent with TIA.  We will control the patient's blood pressure, give aspirin, and plan to admit for stroke work-up.  ----------------------------------------- 9:54 PM on 02/07/2019 -----------------------------------------  Blood pressure has improved.  The patient has no new neurologic symptoms.  She will be admitted for stroke work-up.  I signed her out to the hospitalist Dr. Jannifer Franklin.  __________________________  Carly Marshall was evaluated in Emergency Department on 02/07/2019 for the symptoms described in the history of present illness. She was evaluated in the context of the global COVID-19 pandemic, which necessitated consideration that the patient might be at risk for infection with the SARS-CoV-2 virus that causes COVID-19. Institutional protocols and algorithms that pertain to the evaluation of patients at risk for COVID-19 are in a state of rapid change based on information released by regulatory bodies including the CDC and federal and state organizations. These policies and algorithms were followed during the patient's care in the ED.  ____________________________________________   FINAL CLINICAL IMPRESSION(S) / ED DIAGNOSES  Final diagnoses:  TIA (transient ischemic attack)  Hypertension, unspecified type      NEW MEDICATIONS STARTED DURING THIS VISIT:  New Prescriptions   No medications on file     Note:  This document was prepared using Dragon voice recognition software and may include  unintentional dictation errors.    Arta Silence, MD 02/07/19 2155

## 2019-02-07 NOTE — ED Notes (Signed)
Pt assisted to use bathroom.

## 2019-02-07 NOTE — ED Notes (Signed)
Camila Li- daughter- [place]- [phone]

## 2019-02-07 NOTE — ED Notes (Signed)
Pt returned from CT at this time with this RN.

## 2019-02-07 NOTE — ED Notes (Signed)
Pt assisted to toilet with 1 RN. Tolerated well. Denies dizziness or weakness.

## 2019-02-07 NOTE — ED Notes (Signed)
Attempted to call report and was told the nurse would call me back

## 2019-02-07 NOTE — ED Notes (Signed)
ED TO INPATIENT HANDOFF REPORT  ED Nurse Name and Phone #:  Ariel [phone]  S Name/Age/Gender Carly Marshall 83 y.o. female Room/Bed: ED08A/ED08A  Code Status   Code Status: Not on file  Home/SNF/Other Home Patient oriented to: self, place, time and situation Is this baseline? Yes   Triage Complete: Triage complete  Chief Complaint Hypertension  Triage Note Pt arrives [facility] from home for stroke like symptoms. Arrived from home. At 6:10 daughter noticed slurred speech and confusion. R sided facial droop noted. Speech clear. No arm or leg drift. Hands bilat numb. Sensation intact.   EMS VS 202/110, HR 60, CBG 94   Allergies No Known Allergies  Level of Care/Admitting Diagnosis ED Disposition    ED Disposition Condition [facility]: [place] [100120]  Level of Care: Med-Surg [16]  Covid Evaluation: Screening Protocol (No Symptoms)  Diagnosis: Slurred speech [053976]  Admitting Physician: Lance Coon [[id number]]  Attending Physician: Lance Coon [[id number]]  PT Class (Do Not Modify): Observation [104]  PT Acc Code (Do Not Modify): Observation [10022]       B Medical/Surgery History Past Medical History:  Diagnosis Date  . Allergy   . B12 deficiency anemia   . Hyperlipidemia   . Hypertension   . Thyroid disease    Past Surgical History:  Procedure Laterality Date  . COLONOSCOPY  2012   normal- Dr Sonny Masters  . ECTOPIC PREGNANCY SURGERY       A IV Location/Drains/Wounds Patient Lines/Drains/Airways Status   Active Line/Drains/Airways    Name:   Placement date:   Placement time:   Site:   Days:   Peripheral IV 02/07/19 Left Antecubital   02/07/19    1910    Antecubital   less than 1          Intake/Output Last 24 hours No intake or output data in the 24 hours ending 02/07/19 2214  Labs/Imaging Results for orders placed or performed during the hospital encounter of 02/07/19 (from the past 48 hour(s))  Glucose,  capillary     Status: None   Collection Time: 02/07/19  7:03 PM  Result Value Ref Range   Glucose-Capillary 85 70 - 99 mg/dL  Protime-INR     Status: None   Collection Time: 02/07/19  7:05 PM  Result Value Ref Range   Prothrombin Time 13.7 11.4 - 15.2 seconds   INR 1.1 0.8 - 1.2    Comment: (NOTE) INR goal varies based on device and disease states. Performed at [facility], [address] [place], [place] [address]   APTT     Status: Abnormal   Collection Time: 02/07/19  7:05 PM  Result Value Ref Range   aPTT 57 (H) 24 - 36 seconds    Comment:        IF BASELINE aPTT IS ELEVATED, SUGGEST PATIENT RISK ASSESSMENT BE USED TO DETERMINE APPROPRIATE ANTICOAGULANT THERAPY. Performed at [facility], [address] [place], [place] [address]   CBC     Status: Abnormal   Collection Time: 02/07/19  7:05 PM  Result Value Ref Range   WBC 5.7 4.0 - 10.5 K/uL   RBC 3.58 (L) 3.87 - 5.11 MIL/uL   Hemoglobin 10.7 (L) 12.0 - 15.0 g/dL   HCT 33.9 (L) 36.0 - 46.0 %   MCV 94.7 80.0 - 100.0 fL   MCH 29.9 26.0 - 34.0 pg   MCHC 31.6 30.0 - 36.0 g/dL   RDW 13.2  11.5 - 15.5 %   Platelets 196 150 - 400 K/uL   nRBC 0.0 0.0 - 0.2 %    Comment: Performed at [facility], [address] [place], [place] [address]  Differential     Status: None   Collection Time: 02/07/19  7:05 PM  Result Value Ref Range   Neutrophils Relative % 65 %   Neutro Abs 3.7 1.7 - 7.7 K/uL   Lymphocytes Relative 22 %   Lymphs Abs 1.3 0.7 - 4.0 K/uL   Monocytes Relative 8 %   Monocytes Absolute 0.5 0.1 - 1.0 K/uL   Eosinophils Relative 4 %   Eosinophils Absolute 0.2 0.0 - 0.5 K/uL   Basophils Relative 1 %   Basophils Absolute 0.0 0.0 - 0.1 K/uL   Immature Granulocytes 0 %   Abs Immature Granulocytes 0.01 0.00 - 0.07 K/uL    Comment: Performed at [facility], [address] [place], [place] [address]  Comprehensive metabolic panel     Status: Abnormal   Collection Time:  02/07/19  7:05 PM  Result Value Ref Range   Sodium 139 135 - 145 mmol/L   Potassium 4.0 3.5 - 5.1 mmol/L   Chloride 105 98 - 111 mmol/L   CO2 27 22 - 32 mmol/L   Glucose, Bld 99 70 - 99 mg/dL   BUN 29 (H) 8 - 23 mg/dL   Creatinine, Ser 1.47 (H) 0.44 - 1.00 mg/dL   Calcium 9.4 8.9 - 10.3 mg/dL   Total Protein 7.0 6.5 - 8.1 g/dL   Albumin 4.1 3.5 - 5.0 g/dL   AST 18 15 - 41 U/L   ALT 13 0 - 44 U/L   Alkaline Phosphatase 74 38 - 126 U/L   Total Bilirubin 0.7 0.3 - 1.2 mg/dL   GFR calc non Af Amer 33 (L) >60 mL/min   GFR calc Af Amer 39 (L) >60 mL/min   Anion gap 7 5 - 15    Comment: Performed at [facility], [address], [place], [place] [address]   Ct Head Code Stroke Wo Contrast`  Addendum Date: 02/07/2019   ADDENDUM REPORT: 02/07/2019 19:36 ADDENDUM: Study discussed by telephone with Dr. Arta Silence on 02/07/2019 at 1932 hours. Electronically Signed   By: Genevie Ann M.D.   On: 02/07/2019 19:36   Result Date: 02/07/2019 CLINICAL DATA:  Code stroke. 83 year old female with slurred speech and confusion with right facial droop onset 1810 hours. EXAM: CT HEAD WITHOUT CONTRAST TECHNIQUE: Contiguous axial images were obtained from the base of the skull through the vertex without intravenous contrast. COMPARISON:  None. FINDINGS: Brain: Patchy widespread bilateral cerebral white matter hypodensity. Cerebral volume is within normal limits for age. No ventriculomegaly. No midline shift, mass effect, or evidence of intracranial mass lesion. No acute intracranial hemorrhage identified. No cortically based acute infarct identified. No cortical encephalomalacia identified. Vascular: Calcified atherosclerosis at the skull base. Generalized intracranial artery dolichoectasia. No suspicious intracranial vascular hyperdensity. Skull: Hyperostosis, normal variant. No acute osseous abnormality identified. Sinuses/Orbits: Visible paranasal sinuses are clear. Mild or trace left mastoid fluid.  Right mastoids are clear. Other: No acute orbit or scalp soft tissue finding. ASPECTS [facility] Stroke Program Early CT Score) - Ganglionic level infarction (caudate, lentiform nuclei, internal capsule, insula, M1-M3 cortex): 7 - Supraganglionic infarction (M4-M6 cortex): 3 Total score (0-10 with 10 being normal): 10 IMPRESSION: 1. Advanced cerebral white matter changes most commonly due to small vessel disease, but no acute cortically based infarct or  acute intracranial hemorrhage identified. 2. ASPECTS 10. 3. Generalized intracranial artery dolichoectasia. Electronically Signed: By: Genevie Ann M.D. On: 02/07/2019 19:30    Pending Labs Unresulted Labs (From admission, onward)    Start     Ordered   02/07/19 2118  Novel Coronavirus,NAA,(SEND-OUT TO REF LAB - TAT 24-48 hrs); Hosp Order  (Asymptomatic Patients Labs)  ONCE - STAT,   STAT    Question:  Rule Out  Answer:  Yes   02/07/19 2117   02/07/19 1914  Troponin I (High Sensitivity)  STAT Now then every 2 hours,   STAT     02/07/19 1913   Signed and Held  Hemoglobin A1c  Tomorrow morning,   R     Signed and Held   Signed and Held  Lipid panel  Tomorrow morning,   R    Comments: Fasting    Signed and Held   Signed and Held  CBC  (heparin)  Once,   R    Comments: Baseline for heparin therapy IF NOT ALREADY DRAWN.  Notify MD if PLT < 100 K.    Signed and Held   Signed and Held  Creatinine, serum  (heparin)  Once,   R    Comments: Baseline for heparin therapy IF NOT ALREADY DRAWN.    Signed and Held          Vitals/Pain Today's Vitals   02/07/19 2030 02/07/19 2100 02/07/19 2130 02/07/19 2134  BP: (!) 177/94 (!) 185/63 (!) 188/94   Pulse: 71 66 68   Resp: 14 14  16   Temp:      TempSrc:      SpO2: 95% 96% 97%   Weight:      Height:      PainSc:        Isolation Precautions No active isolations  Medications Medications  hydrALAZINE (APRESOLINE) injection 5 mg (5 mg Intravenous Given 02/07/19 1946)  aspirin chewable tablet 324 mg  (324 mg Oral Given 02/07/19 2029)  hydrALAZINE (APRESOLINE) injection 5 mg (5 mg Intravenous Given 02/07/19 2025)    Mobility walks Low fall risk   Focused Assessments Neuro Assessment Handoff:  Swallow screen pass? Yes  Cardiac Rhythm: Normal sinus rhythm NIH Stroke Scale ( + Modified Stroke Scale Criteria)  Interval: Initial Level of Consciousness (1a.)   : Alert, keenly responsive LOC Questions (1b. )   +: Answers both questions correctly LOC Commands (1c. )   + : Performs both tasks correctly Best Gaze (2. )  +: Normal Visual (3. )  +: No visual loss Facial Palsy (4. )    : Minor paralysis Motor Arm, Left (5a. )   +: No drift Motor Arm, Right (5b. )   +: No drift Motor Leg, Left (6a. )   +: No drift Motor Leg, Right (6b. )   +: No drift Limb Ataxia (7. ): Absent Sensory (8. )   +: Normal, no sensory loss Best Language (9. )   +: No aphasia Dysarthria (10. ): Normal Extinction/Inattention (11.)   +: No Abnormality Modified SS Total  +: 0 Complete NIHSS TOTAL: 1 Last date known well: 02/07/19 Last time known well: 1810 Neuro Assessment: Exceptions to WDL Neuro Checks:   Initial (02/07/19 1909)  Last Documented NIHSS Modified Score: 0 (02/07/19 1909) Has TPA been given? No If patient is a Neuro Trauma and patient is going to OR before floor call report to [place] nurse: [phone] or [phone]     R Recommendations:  See Admitting Provider Note  Report given to:   Additional Notes:

## 2019-02-07 NOTE — ED Notes (Signed)
Report given to  Sarah, RN.

## 2019-02-08 ENCOUNTER — Observation Stay: Payer: Medicare Other

## 2019-02-08 ENCOUNTER — Observation Stay
Admit: 2019-02-08 | Discharge: 2019-02-08 | Disposition: A | Discharge status: Home / Self Care | Payer: Medicare Other | Attending: Internal Medicine | Admitting: Internal Medicine

## 2019-02-08 DIAGNOSIS — R4701 Aphasia: Secondary | ICD-10-CM | POA: Diagnosis not present

## 2019-02-08 DIAGNOSIS — I6782 Cerebral ischemia: Secondary | ICD-10-CM | POA: Diagnosis not present

## 2019-02-08 DIAGNOSIS — E039 Hypothyroidism, unspecified: Secondary | ICD-10-CM | POA: Diagnosis not present

## 2019-02-08 DIAGNOSIS — R4781 Slurred speech: Secondary | ICD-10-CM | POA: Diagnosis not present

## 2019-02-08 DIAGNOSIS — G459 Transient cerebral ischemic attack, unspecified: Secondary | ICD-10-CM | POA: Diagnosis not present

## 2019-02-08 DIAGNOSIS — I10 Essential (primary) hypertension: Secondary | ICD-10-CM | POA: Diagnosis not present

## 2019-02-08 DIAGNOSIS — Z8639 Personal history of other endocrine, nutritional and metabolic disease: Secondary | ICD-10-CM | POA: Diagnosis not present

## 2019-02-08 DIAGNOSIS — E785 Hyperlipidemia, unspecified: Secondary | ICD-10-CM | POA: Diagnosis not present

## 2019-02-08 DIAGNOSIS — R001 Bradycardia, unspecified: Secondary | ICD-10-CM | POA: Diagnosis not present

## 2019-02-08 DIAGNOSIS — Z8679 Personal history of other diseases of the circulatory system: Secondary | ICD-10-CM | POA: Diagnosis not present

## 2019-02-08 LAB — ECHOCARDIOGRAM COMPLETE
Height: 62 in
Weight: 2141.11 oz

## 2019-02-08 LAB — LIPID PANEL
Cholesterol: 138 mg/dL (ref 0–200)
HDL: 53 mg/dL (ref >40)
LDL Cholesterol: 67 mg/dL (ref 0–99)
Total CHOL/HDL Ratio: 2.6 RATIO
Triglycerides: 91 mg/dL (ref <150)
VLDL: 18 mg/dL (ref 0–40)

## 2019-02-08 LAB — TSH: TSH: 2.653 u[IU]/mL (ref 0.350–4.500)

## 2019-02-08 MED ORDER — ASPIRIN 81 MG PO TBEC: 81.0000 mg | DELAYED_RELEASE_TABLET | Freq: Every day | ORAL | 0 refills | Status: DC

## 2019-02-08 MED ORDER — HYDROCHLOROTHIAZIDE 12.5 MG PO CAPS
12.5000 mg | ORAL_CAPSULE | Freq: Every day | ORAL | Status: DC
  Administered 2019-02-08: 09:00:00 12.5 mg via ORAL
  Filled 2019-02-08: qty 1

## 2019-02-08 MED ORDER — CLOPIDOGREL BISULFATE 75 MG PO TABS: 75.0000 mg | ORAL_TABLET | Freq: Every day | ORAL | 0 refills | Status: DC

## 2019-02-08 MED ORDER — AMLODIPINE BESYLATE 2.5 MG PO TABS: 5.0000 mg | ORAL_TABLET | Freq: Every day | ORAL | 0 refills | Status: DC

## 2019-02-08 MED ORDER — LOSARTAN POTASSIUM 50 MG PO TABS
50.0000 mg | ORAL_TABLET | Freq: Every day | ORAL | Status: DC
  Administered 2019-02-08: 50 mg via ORAL
  Filled 2019-02-08: qty 1

## 2019-02-08 MED ORDER — AMLODIPINE BESYLATE 5 MG PO TABS
2.5000 mg | ORAL_TABLET | Freq: Every day | ORAL | Status: DC
  Administered 2019-02-08: 11:00:00 2.5 mg via ORAL
  Filled 2019-02-08: qty 1

## 2019-02-08 MED ORDER — ATORVASTATIN CALCIUM 20 MG PO TABS: 40.0000 mg | ORAL_TABLET | Freq: Every day | ORAL | Status: DC

## 2019-02-08 MED ORDER — CLOPIDOGREL BISULFATE 75 MG PO TABS
75.0000 mg | ORAL_TABLET | Freq: Every day | ORAL | Status: DC
  Administered 2019-02-08: 75 mg via ORAL
  Filled 2019-02-08: qty 1

## 2019-02-08 MED ORDER — ASPIRIN EC 81 MG PO TBEC
81.0000 mg | DELAYED_RELEASE_TABLET | Freq: Every day | ORAL | Status: DC
  Administered 2019-02-08: 09:00:00 81 mg via ORAL
  Filled 2019-02-08: qty 1

## 2019-02-08 NOTE — Progress Notes (Signed)
*  PRELIMINARY RESULTS* Echocardiogram 2D Echocardiogram has been performed.  [name] 02/08/2019, 11:59 AM

## 2019-02-08 NOTE — Progress Notes (Signed)
Patient discharged home with daughter. Discharge education provided, no questions. Carly Marshall

## 2019-02-08 NOTE — Evaluation (Signed)
Occupational Therapy Evaluation Patient Details Name: Carly Marshall MRN: [id number] DOB: [birth year] Today's Date: 02/08/2019    History of Present Illness Pt. is an 83 y.o. female who was admitted to [facility] with Slurred speech, facial droop, and TIA. Imaging revealed Modereate to severe chronic microvascular ischemic changes, and mild volume loss of the brain; advanced cerebral white matter changes most commonly due to small vessel disease. No acute corticically based innfarct  or acute intracranial hemorrhage.   Clinical Impression   Pt.'s initial symptoms have resolved. Pt. has returned to her baseline functioning with basic ADL tasks.  No deficits are present with BUE strength, coordination, motor control, sensation, or proprioceptive awareness warranting Occupational Therapy services at this time. No further OT services at this level of care or follow-up services are indicated. Will complete the order. Pt. Is planning to discharge home today.       Follow Up Recommendations  No OT follow up    Equipment Recommendations       Recommendations for Other Services       Precautions / Restrictions        Mobility Bed Mobility Overal bed mobility: Modified Independent                Transfers     Transfers: Sit to/from Stand Sit to Stand: Independent         General transfer comment: Supervision walking to the sink, and standing at sinkside.    Balance Overall balance assessment: Independent                                         ADL either performed or assessed with clinical judgement   ADL Overall ADL's : Independent                                             Vision Baseline Vision/History: No visual deficits Patient Visual Report: No change from baseline       Perception     Praxis      Pertinent Vitals/Pain Pain Assessment: No/denies pain     Hand Dominance     Extremity/Trunk Assessment Upper Extremity  Assessment Upper Extremity Assessment: Overall WFL for tasks assessed           Communication Communication Communication: No difficulties   Cognition Arousal/Alertness: Awake/alert Behavior During Therapy: WFL for tasks assessed/performed Overall Cognitive Status: Within Functional Limits for tasks assessed                                     General Comments       Exercises     Shoulder Instructions      Home Living Family/patient expects to be discharged to:: Private residence Living Arrangements: Children Available Help at Discharge: Family Type of Home: House       Home Layout: One level     Bathroom Shower/Tub: Tub/shower unit         Home Equipment: None          Prior Functioning/Environment Level of Independence: Independent                 OT Problem List:  OT Treatment/Interventions:      OT Goals(Current goals can be found in the care plan section) Acute Rehab OT Goals Patient Stated Goal: To return home, and return to work OT Goal Formulation: With patient Potential to Achieve Goals: Good  OT Frequency:     Barriers to D/C:            Co-evaluation              AM-PAC OT "6 Clicks" Daily Activity     Outcome Measure Help from another person eating meals?: None Help from another person taking care of personal grooming?: None Help from another person toileting, which includes using toliet, bedpan, or urinal?: A Little Help from another person bathing (including washing, rinsing, drying)?: None Help from another person to put on and taking off regular upper body clothing?: None Help from another person to put on and taking off regular lower body clothing?: None 6 Click Score: 23   End of Session Equipment Utilized During Treatment: Gait belt  Activity Tolerance: Patient tolerated treatment well Patient left: in bed;with call bell/phone within reach;with bed alarm set  OT Visit Diagnosis: Muscle  weakness (generalized) (M62.81)                Time: 1530-1550 OT Time Calculation (min): 20 min Charges:  OT General Charges $OT Visit: 1 Visit OT Evaluation $OT Eval Low Complexity: 1 Low  Harrel Carina, MS, OTR/L  Harrel Carina 02/08/2019, 4:16 PM

## 2019-02-08 NOTE — Consult Note (Signed)
Referring Physician: Darvin Neighbours    Chief Complaint: Slurred speech  HPI: Carly Marshall is an 83 y.o. female with a history of HTN and HLD who reports that at about 1730 yesterday she noticed that her speech was slurred.  She also was unable to get her words out correctly.  Family noticed a facial droop and the patient was brought in for evaluation.  Patient reports symptoms lasted about 30 minutes then resolved.  Initial NIHSS of 0.    Date last known well: Date: 02/07/2019 Time last known well: Time: 17:30 tPA Given: No: Resolution of symptoms  Past Medical History:  Diagnosis Date  . Allergy   . B12 deficiency anemia   . Hyperlipidemia   . Hypertension   . Thyroid disease     Past Surgical History:  Procedure Laterality Date  . COLONOSCOPY  2012   normal- Dr Sonny Masters  . ECTOPIC PREGNANCY SURGERY      Family History  Problem Relation Age of Onset  . Diabetes Mother   . Heart disease Mother   . Fibromyalgia Mother   . Heart disease Father   . Hyperlipidemia Father   . Hypertension Father   . Diabetes Sister   . Brain cancer Brother   . Rheum arthritis Sister    Social History:  reports that she has never smoked. She has never used smokeless tobacco. She reports that she does not drink alcohol or use drugs.  Allergies: No Known Allergies  Medications:  I have reviewed the patient's current medications. Prior to Admission:  Medications Prior to Admission  Medication Sig Dispense Refill Last Dose  . Calcium Carb-Cholecalciferol (CALCIUM 600 + D PO) Take 1 capsule by mouth 2 (two) times daily.   02/07/2019 at 0700  . cyanocobalamin (,VITAMIN B-12,) 1000 MCG/ML injection INJECT 1 ML INTO THE MUSCLE EVERY 30 DAYS. 1 mL 11 01/10/2019 at Unknown time  . fluticasone (FLONASE) 50 MCG/ACT nasal spray Place 2 sprays into both nostrils daily. 16 g 11 02/07/2019 at 0700  . labetalol (NORMODYNE) 300 MG tablet TAKE 1 TABLET(300 MG) BY MOUTH DAILY 90 tablet 1 02/06/2019 at 1700  .  levothyroxine (SYNTHROID, LEVOTHROID) 50 MCG tablet TAKE 1 TABLET(50 MCG) BY MOUTH DAILY 90 tablet 1 02/07/2019 at 0600  . losartan (COZAAR) 50 MG tablet Take 1 tablet (50 mg total) by mouth daily. 90 tablet 3 02/06/2019 at 1700  . simvastatin (ZOCOR) 20 MG tablet TAKE 1 TABLET(20 MG) BY MOUTH DAILY 90 tablet 1 02/07/2019 at 0700  . vitamin C (ASCORBIC ACID) 500 MG tablet Take 500 mg by mouth daily.   02/07/2019 at 0700  . hydrochlorothiazide (HYDRODIURIL) 12.5 MG tablet Take 1 tablet (12.5 mg total) by mouth daily. (Patient not taking: Reported on 02/07/2019) 90 tablet 3 Not Taking at Unknown time   Scheduled: . aspirin EC  81 mg Oral Daily  . atorvastatin  40 mg Oral q1800  . heparin  5,000 Units Subcutaneous Q8H  . hydrochlorothiazide  12.5 mg Oral Daily  . levothyroxine  50 mcg Oral Q0600  . losartan  50 mg Oral Daily    ROS: History obtained from the patient  General ROS: negative for - chills, fatigue, fever, night sweats, weight gain or weight loss Psychological ROS: negative for - behavioral disorder, hallucinations, memory difficulties, mood swings or suicidal ideation Ophthalmic ROS: negative for - blurry vision, double vision, eye pain or loss of vision ENT ROS: negative for - epistaxis, nasal discharge, oral lesions, sore throat, tinnitus or vertigo  Allergy and Immunology ROS: negative for - hives or itchy/watery eyes Hematological and Lymphatic ROS: negative for - bleeding problems, bruising or swollen lymph nodes Endocrine ROS: negative for - galactorrhea, hair pattern changes, polydipsia/polyuria or temperature intolerance Respiratory ROS: negative for - cough, hemoptysis, shortness of breath or wheezing Cardiovascular ROS: negative for - chest pain, dyspnea on exertion, edema or irregular heartbeat Gastrointestinal ROS: negative for - abdominal pain, diarrhea, hematemesis, nausea/vomiting or stool incontinence Genito-Urinary ROS: negative for - dysuria, hematuria, incontinence  or urinary frequency/urgency Musculoskeletal ROS: negative for - joint swelling or muscular weakness Neurological ROS: as noted in HPI Dermatological ROS: negative for rash and skin lesion changes  Physical Examination: Blood pressure (!) 200/68, pulse (!) 57, temperature 97.8 F (36.6 C), temperature source Oral, resp. rate 16, height 5\' 2"  (1.575 m), weight 60.7 kg, SpO2 96 %.  HEENT-  Normocephalic, no lesions, without obvious abnormality.  Normal external eye and conjunctiva.  Normal TM's bilaterally.  Normal auditory canals and external ears. Normal external nose, mucus membranes and septum.  Normal pharynx. Cardiovascular- S1, S2 normal, pulses palpable throughout   Lungs- chest clear, no wheezing, rales, normal symmetric air entry Abdomen- soft, non-tender; bowel sounds normal; no masses,  no organomegaly Extremities- no edema Lymph-no adenopathy palpable Musculoskeletal-no joint tenderness, deformity or swelling Skin-warm and dry, no hyperpigmentation, vitiligo, or suspicious lesions  Neurological Examination   Mental Status: Alert, oriented, thought content appropriate.  Speech fluent without evidence of aphasia.  Able to follow 3 step commands without difficulty. Cranial Nerves: II: Discs flat bilaterally; Visual fields grossly normal, pupils equal, round, reactive to light and accommodation III,IV, VI: ptosis not present, extra-ocular motions intact bilaterally V,VII: smile symmetric, facial light touch sensation normal bilaterally VIII: hearing normal bilaterally IX,X: gag reflex present XI: bilateral shoulder shrug XII: midline tongue extension Motor: Right : Upper extremity   5/5    Left:     Upper extremity   5/5  Lower extremity   5/5     Lower extremity   5/5 Tone and bulk:normal tone throughout; no atrophy noted Sensory: Pinprick and light touch intact throughout, bilaterally Deep Tendon Reflexes: 2+ with 1+ DTR's in the LLE Plantars: Right: downgoing   Left:  downgoing Cerebellar: Normal finger-to-nose and normal heel-to-shin testing bilaterally Gait: not tested due to safety concerns  Laboratory Studies:  Basic Metabolic Panel: Recent Labs  Lab 02/07/19 1905  NA 139  K 4.0  CL 105  CO2 27  GLUCOSE 99  BUN 29*  CREATININE 1.47*  CALCIUM 9.4    Liver Function Tests: Recent Labs  Lab 02/07/19 1905  AST 18  ALT 13  ALKPHOS 74  BILITOT 0.7  PROT 7.0  ALBUMIN 4.1   No results for input(s): LIPASE, AMYLASE in the last 168 hours. No results for input(s): AMMONIA in the last 168 hours.  CBC: Recent Labs  Lab 02/07/19 1905  WBC 5.7  NEUTROABS 3.7  HGB 10.7*  HCT 33.9*  MCV 94.7  PLT 196    Cardiac Enzymes: No results for input(s): CKTOTAL, CKMB, CKMBINDEX, TROPONINI in the last 168 hours.  BNP: Invalid input(s): POCBNP  CBG: Recent Labs  Lab 02/07/19 1903  GLUCAP 85    Microbiology: No results found for this or any previous visit.  Coagulation Studies: Recent Labs    02/07/19 1905  LABPROT 13.7  INR 1.1    Urinalysis: No results for input(s): COLORURINE, LABSPEC, PHURINE, GLUCOSEU, HGBUR, BILIRUBINUR, KETONESUR, PROTEINUR, UROBILINOGEN, NITRITE, LEUKOCYTESUR in the last 168  hours.  Invalid input(s): APPERANCEUR  Lipid Panel:    Component Value Date/Time   CHOL 138 02/08/2019 0354   CHOL 125 11/04/2018 1034   TRIG 91 02/08/2019 0354   HDL 53 02/08/2019 0354   HDL 52 11/04/2018 1034   CHOLHDL 2.6 02/08/2019 0354   VLDL 18 02/08/2019 0354   LDLCALC 67 02/08/2019 0354   LDLCALC 53 11/04/2018 1034    HgbA1C: No results found for: HGBA1C  Urine Drug Screen:  No results found for: LABOPIA, COCAINSCRNUR, LABBENZ, AMPHETMU, THCU, LABBARB  Alcohol Level: No results for input(s): ETH in the last 168 hours.  Other results: EKG: sinus rhythm at 58 bpm.  Imaging: Mr Brain Wo Contrast  Result Date: 02/08/2019 CLINICAL DATA:  83 y/o F; slurred speech and confusion. Right facial droop. Stroke  evaluation. EXAM: MRI HEAD WITHOUT CONTRAST TECHNIQUE: Multiplanar, multiecho pulse sequences of the brain and surrounding structures were obtained without intravenous contrast. COMPARISON:  02/07/2019 CT head. FINDINGS: Brain: No acute infarction, hemorrhage, hydrocephalus, extra-axial collection or mass lesion. Punctate foci of susceptibility hypointensity within right thalamus and frontal white matter are compatible with hemosiderin deposition of chronic microhemorrhage. Early and large confluent nonspecific T2 FLAIR hyperintensities in subcortical and periventricular white matter are compatible with moderate to severe chronic microvascular ischemic changes. Mild volume loss of the brain. Vascular: Normal flow voids. Skull and upper cervical spine: Normal marrow signal. Sinuses/Orbits: Opacification of the left mastoid air cells. No significant abnormal signal of the included paranasal sinuses or right mastoid air cells. Bilateral intra-ocular lens replacement. Other: None. IMPRESSION: 1. No acute intracranial abnormality identified. 2. Moderate to severe chronic microvascular ischemic changes and mild volume loss of the brain. 3. Opacification of left mastoid air cells. Electronically Signed   By: Kristine Garbe M.D.   On: 02/08/2019 03:33   US Carotid Bilateral (at [facility] And [facility] Only)  Result Date: 02/08/2019 CLINICAL DATA:  Slurred speech. History of hypertension and hyperlipidemia. EXAM: BILATERAL CAROTID DUPLEX ULTRASOUND TECHNIQUE: Pearline Cables scale imaging, color Doppler and duplex ultrasound were performed of bilateral carotid and vertebral arteries in the neck. COMPARISON:  None. FINDINGS: Criteria: Quantification of carotid stenosis is based on velocity parameters that correlate the residual internal carotid diameter with NASCET-based stenosis levels, using the diameter of the distal internal carotid lumen as the denominator for stenosis measurement. The following velocity measurements were  obtained: RIGHT ICA: 91/19 cm/sec CCA: 09/73 cm/sec SYSTOLIC ICA/CCA RATIO:  1.8 ECA: 98 cm/sec LEFT ICA: 63/13 cm/sec CCA: 53/2 cm/sec SYSTOLIC ICA/CCA RATIO:  1.1 ECA: 60 cm/sec RIGHT CAROTID ARTERY: There is a very minimal amount of eccentric echogenic plaque involving the origin and proximal aspects of the left internal carotid artery (image 24), not resulting in elevated peak systolic velocities within the right internal carotid artery to suggest a hemodynamically significant stenosis. RIGHT VERTEBRAL ARTERY:  Antegrade flow LEFT CAROTID ARTERY: There is a minimal amount eccentric echogenic plaque within the left carotid bulb (image 49), not resulting in elevated peak systolic velocities within the interrogated course of the left internal carotid artery to suggest a hemodynamically significant stenosis. LEFT VERTEBRAL ARTERY:  Antegrade Flow IMPRESSION: Minimal amount of bilateral atherosclerotic plaque, left subjectively greater than right, not resulting in a hemodynamically significant stenosis within either internal carotid artery. Electronically Signed   By: Sandi Mariscal M.D.   On: 02/08/2019 08:19   Ct Head Code Stroke Wo Contrast`  Addendum Date: 02/07/2019   ADDENDUM REPORT: 02/07/2019 19:36 ADDENDUM: Study discussed by telephone with Dr. Felix Ahmadi  SIADECKI on 02/07/2019 at 1932 hours. Electronically Signed   By: Genevie Ann M.D.   On: 02/07/2019 19:36   Result Date: 02/07/2019 CLINICAL DATA:  Code stroke. 83 year old female with slurred speech and confusion with right facial droop onset 1810 hours. EXAM: CT HEAD WITHOUT CONTRAST TECHNIQUE: Contiguous axial images were obtained from the base of the skull through the vertex without intravenous contrast. COMPARISON:  None. FINDINGS: Brain: Patchy widespread bilateral cerebral white matter hypodensity. Cerebral volume is within normal limits for age. No ventriculomegaly. No midline shift, mass effect, or evidence of intracranial mass lesion. No acute  intracranial hemorrhage identified. No cortically based acute infarct identified. No cortical encephalomalacia identified. Vascular: Calcified atherosclerosis at the skull base. Generalized intracranial artery dolichoectasia. No suspicious intracranial vascular hyperdensity. Skull: Hyperostosis, normal variant. No acute osseous abnormality identified. Sinuses/Orbits: Visible paranasal sinuses are clear. Mild or trace left mastoid fluid. Right mastoids are clear. Other: No acute orbit or scalp soft tissue finding. ASPECTS [facility] Stroke Program Early CT Score) - Ganglionic level infarction (caudate, lentiform nuclei, internal capsule, insula, M1-M3 cortex): 7 - Supraganglionic infarction (M4-M6 cortex): 3 Total score (0-10 with 10 being normal): 10 IMPRESSION: 1. Advanced cerebral white matter changes most commonly due to small vessel disease, but no acute cortically based infarct or acute intracranial hemorrhage identified. 2. ASPECTS 10. 3. Generalized intracranial artery dolichoectasia. Electronically Signed: By: Genevie Ann M.D. On: 02/07/2019 19:30    Assessment: 83 y.o. female presenting with an episode of aphasia and facial droop.  Symptoms resolved spontaneously.  Patient on no antiplatelet therapy prior to admission.  MRI of the brain reviewed and shows no acute changes.  TIA likely etiology.  Carotid dopplers show no evidence of hemodynamically significant stenosis.  Echocardiogram pending.  A1c pending, LDL 67.  Patient on a statin.    Stroke Risk Factors - hyperlipidemia and hypertension  Plan: 1. PT consult, OT consult, Speech consult 2. Echocardiogram pending 3. Prophylactic therapy-Dual antiplatelet therapy with ASA 81mg  and Plavix 75mg  for three weeks with change to ASA 81mg  alone as monotherapy after that time.  Continue statin. 4. NPO until RN stroke swallow screen 5. Telemetry monitoring 6. Frequent neuro checks   Alexis Goodell, MD Neurology [phone] 02/08/2019, 10:12  AM

## 2019-02-08 NOTE — Progress Notes (Signed)
Patient has had several episodes of bradycardia. Dr. Darvin Neighbours notified. Ordered EKG. Patient asymptomatic. BP elevated when vital signs taken, notified MD of that as well. Reordered home BP meds, some improvement, added amlodipine. Patient instructed by MD not to take labetolol when discharged, as this may be the cause. Lupita Leash

## 2019-02-08 NOTE — Progress Notes (Signed)
PT Cancellation Note  Patient Details Name: Carly Marshall MRN: [id number] DOB: 02/22/1936   Cancelled Treatment:    Reason Eval/Treat Not Completed: Medical issues which prohibited therapy(Chart reviewed, noted HTN twice this AM: 200/30mmHg at 8:30 and 199/51mmHg at 10:30. Will hold PT eval until pt is medically appropriate.)  12:05 PM, 02/08/19 Etta Grandchild, PT, DPT Physical Therapist - [facility]  [phone] ([place])    Tulia C 02/08/2019, 12:05 PM

## 2019-02-08 NOTE — Discharge Instructions (Signed)
Heart healthy diet  Return if symptoms recur.

## 2019-02-09 LAB — HEMOGLOBIN A1C
Hgb A1c MFr Bld: 5.8 % — ABNORMAL HIGH (ref 4.8–5.6)
Mean Plasma Glucose: 120 mg/dL

## 2019-02-09 LAB — NOVEL CORONAVIRUS, NAA (HOSP ORDER, SEND-OUT TO REF LAB; TAT 18-24 HRS): SARS-CoV-2, NAA: NOT DETECTED

## 2019-02-10 DIAGNOSIS — I129 Hypertensive chronic kidney disease with stage 1 through stage 4 chronic kidney disease, or unspecified chronic kidney disease: Secondary | ICD-10-CM | POA: Diagnosis not present

## 2019-02-10 DIAGNOSIS — N281 Cyst of kidney, acquired: Secondary | ICD-10-CM | POA: Diagnosis not present

## 2019-02-10 DIAGNOSIS — N183 Chronic kidney disease, stage 3 (moderate): Secondary | ICD-10-CM | POA: Diagnosis not present

## 2019-02-11 NOTE — Discharge Summary (Signed)
[facility] [place] NAME: Carly Marshall    MR#:  [id number]  DATE OF BIRTH:  April 06, 1936  DATE OF ADMISSION:  02/07/2019 ADMITTING PHYSICIAN: Lance Coon, MD  DATE OF DISCHARGE: 02/08/2019  4:19 PM  PRIMARY CARE PHYSICIAN: Juline Patch, MD   ADMISSION DIAGNOSIS:  TIA (transient ischemic attack) [G45.9] Hypertension, unspecified type [I10]  DISCHARGE DIAGNOSIS:  Principal Problem:   Aphasia Active Problems:   Essential hypertension   Hypothyroidism   Hyperlipidemia   CKD (chronic kidney disease), stage III ([place])   SECONDARY DIAGNOSIS:   Past Medical History:  Diagnosis Date  . Allergy   . B12 deficiency anemia   . Hyperlipidemia   . Hypertension   . Thyroid disease      ADMITTING HISTORY  Carly Marshall  is a 83 y.o. female who presents with chief complaint as above.  Patient presents to the ED with complaint of an episode of aphasia.  She describes an episode where she was unable to say the words that she thought in her head.,  She states that "they came out all in a mixed up jumble".  Per family's report she had slurred speech and some facial droop.  Here in the ED her symptoms resolved.  Given the concern for stroke hospitalist were called for admission  HOSPITAL COURSE:   *Aphasia.  Patient symptoms improved.  MRI showed nothing acute.  Diagnosed with TIA.  Seen by neurology.  Started on aspirin and Plavix.  Statin.  Patient worked with physical therapy, speech and with improved symptoms was discharged home.  Carotid Dopplers and echocardiogram but nothing significant.  Hypertension, hypothyroidism, hyperlipidemia stable.  CONSULTS OBTAINED:  Treatment Team:  Catarina Hartshorn, MD  DRUG ALLERGIES:  No Known Allergies  DISCHARGE MEDICATIONS:   Allergies as of 02/08/2019   No Known Allergies     Medication List    STOP taking these medications   labetalol 300 MG tablet Commonly known as: NORMODYNE     TAKE  these medications   amLODipine 2.5 MG tablet Commonly known as: NORVASC Take 2 tablets (5 mg total) by mouth daily.   aspirin 81 MG EC tablet Take 1 tablet (81 mg total) by mouth daily.   CALCIUM 600 + D PO Take 1 capsule by mouth 2 (two) times daily.   clopidogrel 75 MG tablet Commonly known as: PLAVIX Take 1 tablet (75 mg total) by mouth daily.   cyanocobalamin 1000 MCG/ML injection Commonly known as: (VITAMIN B-12) INJECT 1 ML INTO THE MUSCLE EVERY 30 DAYS.   fluticasone 50 MCG/ACT nasal spray Commonly known as: FLONASE Place 2 sprays into both nostrils daily.   hydrochlorothiazide 12.5 MG tablet Commonly known as: HYDRODIURIL Take 1 tablet (12.5 mg total) by mouth daily.   levothyroxine 50 MCG tablet Commonly known as: SYNTHROID TAKE 1 TABLET(50 MCG) BY MOUTH DAILY   losartan 50 MG tablet Commonly known as: COZAAR Take 1 tablet (50 mg total) by mouth daily.   simvastatin 20 MG tablet Commonly known as: ZOCOR TAKE 1 TABLET(20 MG) BY MOUTH DAILY   vitamin C 500 MG tablet Commonly known as: ASCORBIC ACID Take 500 mg by mouth daily.       Today   VITAL SIGNS:  Blood pressure (!) 174/70, pulse 61, temperature 98.7 F (37.1 C), resp. rate 18, height 5\' 2"  (1.575 m), weight 60.7 kg, SpO2 96 %.  I/O:  No intake or output data in the 24 hours ending 02/11/19 1328  PHYSICAL EXAMINATION:  Physical Exam  GENERAL:  83 y.o.-year-old patient lying in the bed with no acute distress.  LUNGS: Normal breath sounds bilaterally, no wheezing, rales,rhonchi or crepitation. No use of accessory muscles of respiration.  CARDIOVASCULAR: S1, S2 normal. No murmurs, rubs, or gallops.  ABDOMEN: Soft, non-tender, non-distended. Bowel sounds present. No organomegaly or mass.  NEUROLOGIC: Moves all 4 extremities. PSYCHIATRIC: The patient is alert and oriented x 3.  SKIN: No obvious rash, lesion, or ulcer.   DATA REVIEW:   CBC Recent Labs  Lab 02/07/19 1905  WBC 5.7  HGB  10.7*  HCT 33.9*  PLT 196    Chemistries  Recent Labs  Lab 02/07/19 1905  NA 139  K 4.0  CL 105  CO2 27  GLUCOSE 99  BUN 29*  CREATININE 1.47*  CALCIUM 9.4  AST 18  ALT 13  ALKPHOS 74  BILITOT 0.7    Cardiac Enzymes No results for input(s): TROPONINI in the last 168 hours.  Microbiology Results  Results for orders placed or performed during the hospital encounter of 02/07/19  Novel Coronavirus,NAA,(SEND-OUT TO REF LAB - TAT 24-48 hrs); Hosp Order     Status: None   Collection Time: 02/07/19  9:50 PM   Specimen: Nasopharyngeal Swab; Respiratory  Result Value Ref Range Status   SARS-CoV-2, NAA NOT DETECTED NOT DETECTED Final    Comment: (NOTE) This test was developed and its performance characteristics determined by [employer]. This test has not been FDA cleared or approved. This test has been authorized by FDA under an Emergency Use Authorization (EUA). This test is only authorized for the duration of time the declaration that circumstances exist justifying the authorization of the emergency use of in vitro diagnostic tests for detection of SARS-CoV-2 virus and/or diagnosis of COVID-19 infection under section 564(b)(1) of the Act, 21 U.S.C. 284XLK-4(M)(0), unless the authorization is terminated or revoked sooner. When diagnostic testing is negative, the possibility of a false negative result should be considered in the context of a patient's recent exposures and the presence of clinical signs and symptoms consistent with COVID-19. An individual without symptoms of COVID-19 and who is not shedding SARS-CoV-2 virus would expect to have a negative (not detected) result in this assay. Performed  At: [facility] [place], [place] [id number] Rush Farmer MD [id number]    [place]  Final    Comment: Performed at [facility], [address] [place], [place] [address]    RADIOLOGY:  No results  found.  Follow up with PCP in 1 week.  Management plans discussed with the patient, family and they are in agreement.  CODE STATUS:  Code Status History    Date Active Date Inactive Code Status Order ID Comments User Context   02/07/2019 2318 02/08/2019 1925 Full Code [id number]  Lance Coon, MD Inpatient   Advance Care Planning Activity      TOTAL TIME TAKING CARE OF THIS PATIENT ON DAY OF DISCHARGE: more than 30 minutes.   Leia Alf Derell Bruun M.D on 02/11/2019 at 1:28 PM  Between 7am to 6pm - Pager - [phone]  After 6pm go to [email or web address] - password [email or web address] [facility]  Office  [phone]  CC: Primary care physician; Juline Patch, MD  Note: This dictation was prepared with Dragon dictation along with smaller phrase technology. Any transcriptional errors that result from this process are unintentional.

## 2019-02-14 DIAGNOSIS — H35033 Hypertensive retinopathy, bilateral: Secondary | ICD-10-CM | POA: Diagnosis not present

## 2019-02-14 DIAGNOSIS — H40013 Open angle with borderline findings, low risk, bilateral: Secondary | ICD-10-CM | POA: Diagnosis not present

## 2019-02-15 ENCOUNTER — Ambulatory Visit (INDEPENDENT_AMBULATORY_CARE_PROVIDER_SITE_OTHER): Payer: Medicare Other | Admitting: Family Medicine

## 2019-02-15 ENCOUNTER — Encounter: Payer: Self-pay | Admitting: Family Medicine

## 2019-02-15 ENCOUNTER — Other Ambulatory Visit: Payer: Self-pay

## 2019-02-15 VITALS — BP 150/84 | HR 84 | Ht 62.0 in | Wt 133.0 lb

## 2019-02-15 DIAGNOSIS — I10 Essential (primary) hypertension: Secondary | ICD-10-CM | POA: Diagnosis not present

## 2019-02-15 DIAGNOSIS — I679 Cerebrovascular disease, unspecified: Secondary | ICD-10-CM

## 2019-02-15 MED ORDER — CLOPIDOGREL BISULFATE 75 MG PO TABS: 75.0000 mg | ORAL_TABLET | Freq: Every day | ORAL | 3 refills | Status: DC

## 2019-02-15 MED ORDER — AMLODIPINE BESYLATE 5 MG PO TABS: 5.0000 mg | ORAL_TABLET | Freq: Every day | ORAL | 3 refills | Status: DC

## 2019-02-15 NOTE — Progress Notes (Signed)
Date:  02/15/2019   Name:  Carly Marshall   DOB:  October 28, 1935   MRN:  [id number]   Chief Complaint: Follow-up (hospital follow up- added Amlodipine and clopidigrel- Dx stroke on 02/07/2019)  Pt was recently admitted to [facility]  For TIA/HTN on 02/07/19 and was discharged on 02/08/19. Transition of care call placed on not registered..    Review of Systems  Constitutional: Negative.  Negative for chills, fatigue, fever and unexpected weight change.  HENT: Negative for congestion, ear discharge, ear pain, rhinorrhea, sinus pressure, sneezing and sore throat.   Eyes: Negative for photophobia, pain, discharge, redness and itching.  Respiratory: Negative for cough, shortness of breath, wheezing and stridor.   Cardiovascular: Negative for chest pain, palpitations and leg swelling.  Gastrointestinal: Negative for abdominal pain, blood in stool, constipation, diarrhea, nausea and vomiting.  Endocrine: Negative for cold intolerance, heat intolerance, polydipsia, polyphagia and polyuria.  Genitourinary: Negative for dysuria, flank pain, frequency, hematuria, menstrual problem, pelvic pain, urgency, vaginal bleeding and vaginal discharge.  Musculoskeletal: Negative for arthralgias, back pain and myalgias.  Skin: Negative for rash.  Allergic/Immunologic: Negative for environmental allergies and food allergies.  Neurological: Negative for dizziness, weakness, light-headedness, numbness and headaches.  Hematological: Negative for adenopathy. Does not bruise/bleed easily.  Psychiatric/Behavioral: Negative for dysphoric mood. The patient is not nervous/anxious.     Patient Active Problem List   Diagnosis Date Noted  . Aphasia 02/07/2019  . CKD (chronic kidney disease), stage III ([place]) 02/07/2019  . B12 deficiency 04/28/2016  . Essential hypertension 03/27/2015  . Hypothyroidism 03/27/2015  . Hyperlipidemia 03/27/2015  . Allergic rhinitis due to pollen 03/27/2015    No Known Allergies   Past Surgical History:  Procedure Laterality Date  . COLONOSCOPY  2012   normal- Dr Sonny Masters  . ECTOPIC PREGNANCY SURGERY      Social History   Tobacco Use  . Smoking status: Never Smoker  . Smokeless tobacco: Never Used  . Tobacco comment: smoking cessation materials not required  Substance Use Topics  . Alcohol use: No    Alcohol/week: 0.0 standard drinks  . Drug use: No     Medication list has been reviewed and updated.  Current Meds  Medication Sig  . amLODipine (NORVASC) 2.5 MG tablet Take 2 tablets (5 mg total) by mouth daily.  Marland Kitchen aspirin EC 81 MG EC tablet Take 1 tablet (81 mg total) by mouth daily.  . Calcium Carb-Cholecalciferol (CALCIUM 600 + D PO) Take 1 capsule by mouth 2 (two) times daily.  . clopidogrel (PLAVIX) 75 MG tablet Take 1 tablet (75 mg total) by mouth daily.  . cyanocobalamin (,VITAMIN B-12,) 1000 MCG/ML injection INJECT 1 ML INTO THE MUSCLE EVERY 30 DAYS.  . fluticasone (FLONASE) 50 MCG/ACT nasal spray Place 2 sprays into both nostrils daily.  . hydrochlorothiazide (HYDRODIURIL) 12.5 MG tablet Take 1 tablet (12.5 mg total) by mouth daily.  Marland Kitchen levothyroxine (SYNTHROID, LEVOTHROID) 50 MCG tablet TAKE 1 TABLET(50 MCG) BY MOUTH DAILY  . losartan (COZAAR) 50 MG tablet Take 1 tablet (50 mg total) by mouth daily.  . Multiple Vitamins-Minerals (OCUVITE ADULT 50+ PO) Take 1 tablet by mouth daily.  . simvastatin (ZOCOR) 20 MG tablet TAKE 1 TABLET(20 MG) BY MOUTH DAILY  . vitamin C (ASCORBIC ACID) 500 MG tablet Take 500 mg by mouth daily.    PHQ 2/9 Scores 02/15/2019 11/04/2018 03/10/2018 03/02/2017  PHQ - 2 Score 0 0 0 0  PHQ- 9 Score 0 0 0 -  BP Readings from Last 3 Encounters:  02/15/19 (!) 150/84  02/08/19 (!) 174/70  11/04/18 122/70    Physical Exam Vitals signs and nursing note reviewed.  Constitutional:      Appearance: She is well-developed.  HENT:     Head: Normocephalic.     Right Ear: Tympanic membrane, ear canal and external ear normal.      Left Ear: Tympanic membrane, ear canal and external ear normal.     Mouth/Throat:     Mouth: Mucous membranes are moist.  Eyes:     General: Lids are everted, no foreign bodies appreciated. No scleral icterus.       Left eye: No foreign body or hordeolum.     Conjunctiva/sclera: Conjunctivae normal.     Right eye: Right conjunctiva is not injected.     Left eye: Left conjunctiva is not injected.     Pupils: Pupils are equal, round, and reactive to light.  Neck:     Musculoskeletal: Normal range of motion and neck supple.     Thyroid: No thyromegaly.     Vascular: No JVD.     Trachea: No tracheal deviation.  Cardiovascular:     Rate and Rhythm: Normal rate and regular rhythm.     Heart sounds: Normal heart sounds. No murmur. No friction rub. No gallop.   Pulmonary:     Effort: Pulmonary effort is normal. No respiratory distress.     Breath sounds: Normal breath sounds. No wheezing or rales.  Abdominal:     General: Bowel sounds are normal.     Palpations: Abdomen is soft. There is no mass.     Tenderness: There is no abdominal tenderness. There is no guarding or rebound.  Musculoskeletal: Normal range of motion.        General: No tenderness.  Lymphadenopathy:     Cervical: No cervical adenopathy.  Skin:    General: Skin is warm.     Capillary Refill: Capillary refill takes less than 2 seconds.     Findings: No rash.  Neurological:     Mental Status: She is alert and oriented to person, place, and time.     Cranial Nerves: No cranial nerve deficit.     Sensory: No sensory deficit.     Motor: No weakness.     Deep Tendon Reflexes: Reflexes normal.  Psychiatric:        Mood and Affect: Mood is not anxious or depressed.     Wt Readings from Last 3 Encounters:  02/15/19 133 lb (60.3 kg)  02/07/19 133 lb 13.1 oz (60.7 kg)  11/04/18 132 lb (59.9 kg)    BP (!) 150/84   Pulse 84   Ht 5\' 2"  (1.575 m)   Wt 133 lb (60.3 kg)   BMI 24.33 kg/m   Assessment and Plan:  1.  Cerebral vascular disease Patient recently admitted and discharged on 30 June with TIA like event.  Patient has recovered except still feels like she is not yet strength.  Will continue Plavix 75 mg once a day.  With continued control of her blood pressure and management. - clopidogrel (PLAVIX) 75 MG tablet; Take 1 tablet (75 mg total) by mouth daily.  Dispense: 30 tablet; Refill: 3  2. Essential hypertension Chronic.  Controlled.  Patient had mild elevation of her systolic in the 413 range we will gradually increase her amlodipine to 5 mg twice a day as tolerated and recheck in 6 weeks. - amLODipine (NORVASC) 5 MG tablet; Take  1 tablet (5 mg total) by mouth daily.  Dispense: 60 tablet; Refill: 3

## 2019-02-17 DIAGNOSIS — N184 Chronic kidney disease, stage 4 (severe): Secondary | ICD-10-CM | POA: Diagnosis not present

## 2019-02-17 DIAGNOSIS — N390 Urinary tract infection, site not specified: Secondary | ICD-10-CM | POA: Diagnosis not present

## 2019-02-17 DIAGNOSIS — I129 Hypertensive chronic kidney disease with stage 1 through stage 4 chronic kidney disease, or unspecified chronic kidney disease: Secondary | ICD-10-CM | POA: Diagnosis not present

## 2019-02-17 DIAGNOSIS — N281 Cyst of kidney, acquired: Secondary | ICD-10-CM | POA: Diagnosis not present

## 2019-02-21 DIAGNOSIS — G939 Disorder of brain, unspecified: Secondary | ICD-10-CM | POA: Diagnosis not present

## 2019-02-21 DIAGNOSIS — G3184 Mild cognitive impairment, so stated: Secondary | ICD-10-CM | POA: Diagnosis not present

## 2019-02-21 DIAGNOSIS — R0683 Snoring: Secondary | ICD-10-CM | POA: Diagnosis not present

## 2019-02-21 DIAGNOSIS — G459 Transient cerebral ischemic attack, unspecified: Secondary | ICD-10-CM | POA: Diagnosis not present

## 2019-03-09 ENCOUNTER — Other Ambulatory Visit: Payer: Self-pay

## 2019-03-09 ENCOUNTER — Telehealth: Payer: Self-pay

## 2019-03-09 MED ORDER — AMLODIPINE BESYLATE 2.5 MG PO TABS: 2.5000 mg | ORAL_TABLET | Freq: Every day | ORAL | 0 refills | Status: DC

## 2019-03-09 NOTE — Telephone Encounter (Signed)
Started on Amlodipine 2.5mg  qhs in addition to what she is already taking. Will recheck b/p on the 17th at her next appt

## 2019-03-09 NOTE — Progress Notes (Unsigned)
Sent in Amlodipine 2.5mg  qhs

## 2019-03-09 NOTE — Telephone Encounter (Signed)
Called patient this morning to confirm AWV appt and she states she is concerned about her blood pressure since medication was changed. She reports it is better in the morning and higher in the evening with systolic reading in the 518D in am and up to 175 last night. Please advise. Thank you!

## 2019-03-14 ENCOUNTER — Ambulatory Visit (INDEPENDENT_AMBULATORY_CARE_PROVIDER_SITE_OTHER): Payer: Medicare Other

## 2019-03-14 VITALS — BP 137/48 | Ht 62.0 in | Wt 133.0 lb

## 2019-03-14 DIAGNOSIS — Z0000 Encounter for general adult medical examination without abnormal findings: Secondary | ICD-10-CM

## 2019-03-14 NOTE — Patient Instructions (Signed)
Carly Marshall , Thank you for taking time to come for your Medicare Wellness Visit. I appreciate your ongoing commitment to your health goals. Please review the following plan we discussed and let me know if I can assist you in the future.    Screening recommendations/referrals: Colonoscopy: done 2011 Mammogram: done 01/14/12 Bone Density: done 03/24/18 Recommended yearly ophthalmology/optometry visit for glaucoma screening and checkup Recommended yearly dental visit for hygiene and checkup  Vaccinations: Influenza vaccine: done 05/04/18 Pneumococcal vaccine: done 03/27/15 Tdap vaccine: done 03/27/15 Shingles vaccine: Shingrix discussed. Please contact your pharmacy for coverage information.   Advanced directives: Please bring a copy of your health care power of attorney and living will to the office at your convenience.  Conditions/risks identified: recommend increasing physical activity to at least 3 days per week  Next appointment: Please follow up in one year for your Medicare Annual Wellness visit.     Preventive Care 49 Years and Older, Female Preventive care refers to lifestyle choices and visits with your health care provider that can promote health and wellness. What does preventive care include?  A yearly physical exam. This is also called an annual well check.  Dental exams once or twice a year.  Routine eye exams. Ask your health care provider how often you should have your eyes checked.  Personal lifestyle choices, including:  Daily care of your teeth and gums.  Regular physical activity.  Eating a healthy diet.  Avoiding tobacco and drug use.  Limiting alcohol use.  Practicing safe sex.  Taking low-dose aspirin every day.  Taking vitamin and mineral supplements as recommended by your health care provider. What happens during an annual well check? The services and screenings done by your health care provider during your annual well check will depend on your age,  overall health, lifestyle risk factors, and family history of disease. Counseling  Your health care provider may ask you questions about your:  Alcohol use.  Tobacco use.  Drug use.  Emotional well-being.  Home and relationship well-being.  Sexual activity.  Eating habits.  History of falls.  Memory and ability to understand (cognition).  Work and work Statistician.  Reproductive health. Screening  You may have the following tests or measurements:  Height, weight, and BMI.  Blood pressure.  Lipid and cholesterol levels. These may be checked every 5 years, or more frequently if you are over 68 years old.  Skin check.  Lung cancer screening. You may have this screening every year starting at age 23 if you have a 30-pack-year history of smoking and currently smoke or have quit within the past 15 years.  Fecal occult blood test (FOBT) of the stool. You may have this test every year starting at age 60.  Flexible sigmoidoscopy or colonoscopy. You may have a sigmoidoscopy every 5 years or a colonoscopy every 10 years starting at age 78.  Hepatitis C blood test.  Hepatitis B blood test.  Sexually transmitted disease (STD) testing.  Diabetes screening. This is done by checking your blood sugar (glucose) after you have not eaten for a while (fasting). You may have this done every 1-3 years.  Bone density scan. This is done to screen for osteoporosis. You may have this done starting at age 34.  Mammogram. This may be done every 1-2 years. Talk to your health care provider about how often you should have regular mammograms. Talk with your health care provider about your test results, treatment options, and if necessary, the need for more  tests. Vaccines  Your health care provider may recommend certain vaccines, such as:  Influenza vaccine. This is recommended every year.  Tetanus, diphtheria, and acellular pertussis (Tdap, Td) vaccine. You may need a Td booster every 10  years.  Zoster vaccine. You may need this after age 66.  Pneumococcal 13-valent conjugate (PCV13) vaccine. One dose is recommended after age 62.  Pneumococcal polysaccharide (PPSV23) vaccine. One dose is recommended after age 22. Talk to your health care provider about which screenings and vaccines you need and how often you need them. This information is not intended to replace advice given to you by your health care provider. Make sure you discuss any questions you have with your health care provider. Document Released: 08/24/2015 Document Revised: 04/16/2016 Document Reviewed: 05/29/2015 Elsevier Interactive Patient Education  2017 [place] Prevention in the Home Falls can cause injuries. They can happen to people of all ages. There are many things you can do to make your home safe and to help prevent falls. What can I do on the outside of my home?  Regularly fix the edges of walkways and driveways and fix any cracks.  Remove anything that might make you trip as you walk through a door, such as a raised step or threshold.  Trim any bushes or trees on the path to your home.  Use bright outdoor lighting.  Clear any walking paths of anything that might make someone trip, such as rocks or tools.  Regularly check to see if handrails are loose or broken. Make sure that both sides of any steps have handrails.  Any raised decks and porches should have guardrails on the edges.  Have any leaves, snow, or ice cleared regularly.  Use sand or salt on walking paths during winter.  Clean up any spills in your garage right away. This includes oil or grease spills. What can I do in the bathroom?  Use night lights.  Install grab bars by the toilet and in the tub and shower. Do not use towel bars as grab bars.  Use non-skid mats or decals in the tub or shower.  If you need to sit down in the shower, use a plastic, non-slip stool.  Keep the floor dry. Clean up any water that  spills on the floor as soon as it happens.  Remove soap buildup in the tub or shower regularly.  Attach bath mats securely with double-sided non-slip rug tape.  Do not have throw rugs and other things on the floor that can make you trip. What can I do in the bedroom?  Use night lights.  Make sure that you have a light by your bed that is easy to reach.  Do not use any sheets or blankets that are too big for your bed. They should not hang down onto the floor.  Have a firm chair that has side arms. You can use this for support while you get dressed.  Do not have throw rugs and other things on the floor that can make you trip. What can I do in the kitchen?  Clean up any spills right away.  Avoid walking on wet floors.  Keep items that you use a lot in easy-to-reach places.  If you need to reach something above you, use a strong step stool that has a grab bar.  Keep electrical cords out of the way.  Do not use floor polish or wax that makes floors slippery. If you must use wax, use non-skid floor  wax.  Do not have throw rugs and other things on the floor that can make you trip. What can I do with my stairs?  Do not leave any items on the stairs.  Make sure that there are handrails on both sides of the stairs and use them. Fix handrails that are broken or loose. Make sure that handrails are as long as the stairways.  Check any carpeting to make sure that it is firmly attached to the stairs. Fix any carpet that is loose or worn.  Avoid having throw rugs at the top or bottom of the stairs. If you do have throw rugs, attach them to the floor with carpet tape.  Make sure that you have a light switch at the top of the stairs and the bottom of the stairs. If you do not have them, ask someone to add them for you. What else can I do to help prevent falls?  Wear shoes that:  Do not have high heels.  Have rubber bottoms.  Are comfortable and fit you well.  Are closed at the  toe. Do not wear sandals.  If you use a stepladder:  Make sure that it is fully opened. Do not climb a closed stepladder.  Make sure that both sides of the stepladder are locked into place.  Ask someone to hold it for you, if possible.  Clearly mark and make sure that you can see:  Any grab bars or handrails.  First and last steps.  Where the edge of each step is.  Use tools that help you move around (mobility aids) if they are needed. These include:  Canes.  Walkers.  Scooters.  Crutches.  Turn on the lights when you go into a dark area. Replace any light bulbs as soon as they burn out.  Set up your furniture so you have a clear path. Avoid moving your furniture around.  If any of your floors are uneven, fix them.  If there are any pets around you, be aware of where they are.  Review your medicines with your doctor. Some medicines can make you feel dizzy. This can increase your chance of falling. Ask your doctor what other things that you can do to help prevent falls. This information is not intended to replace advice given to you by your health care provider. Make sure you discuss any questions you have with your health care provider. Document Released: 05/24/2009 Document Revised: 01/03/2016 Document Reviewed: 09/01/2014 Elsevier Interactive Patient Education  2017 [employer].

## 2019-03-14 NOTE — Progress Notes (Signed)
Subjective:   Carly Marshall is a 83 y.o. female who presents for Medicare Annual (Subsequent) preventive examination.  Virtual Visit via Telephone Note  I connected with Carly Marshall on 03/14/19 at  1:20 PM EDT by telephone and verified that I am speaking with the correct person using two identifiers.  Medicare Annual Wellness visit completed telephonically due to Covid-19 pandemic.   Location: Patient: home Provider: office   I discussed the limitations, risks, security and privacy concerns of performing an evaluation and management service by telephone and the availability of in person appointments. The patient expressed understanding and agreed to proceed.  Some vital signs may be absent or patient reported.  Clemetine Marker, LPN    Review of Systems:   Cardiac Risk Factors include: advanced age (>52men, >53 women);dyslipidemia;hypertension     Objective:     Vitals: BP (!) 137/48   Ht 5\' 2"  (1.575 m)   Wt 133 lb (60.3 kg)   BMI 24.33 kg/m   Body mass index is 24.33 kg/m.  Advanced Directives 03/14/2019 02/08/2019 02/07/2019 03/10/2018 03/02/2017 03/27/2015  Does Patient Have a Medical Advance Directive? Yes No No No No No  Type of Paramedic of [place];Living will - - - - -  Copy of [place] in Chart? No - copy requested - - - - -  Would patient like information on creating a medical advance directive? - No - Patient declined No - Patient declined Yes (MAU/Ambulatory/Procedural Areas - Information given) Yes (MAU/Ambulatory/Procedural Areas - Information given) -    Tobacco Social History   Tobacco Use  Smoking Status Never Smoker  Smokeless Tobacco Never Used  Tobacco Comment   smoking cessation materials not required     Counseling given: Not Answered Comment: smoking cessation materials not required   Clinical Intake:  Pre-visit preparation completed: Yes  Pain : No/denies pain     BMI - recorded: 24.33  Nutritional Status: BMI of 19-24  Normal Nutritional Risks: None Diabetes: No  How often do you need to have someone help you when you read instructions, pamphlets, or other written materials from your doctor or pharmacy?: 1 - Never  Interpreter Needed?: No  Information entered by :: Clemetine Marker LPN  Past Medical History:  Diagnosis Date  . Allergy   . B12 deficiency anemia   . Hyperlipidemia   . Hypertension   . Stroke ([place])   . Thyroid disease    Past Surgical History:  Procedure Laterality Date  . COLONOSCOPY  2012   normal- Dr Sonny Masters  . ECTOPIC PREGNANCY SURGERY     Family History  Problem Relation Age of Onset  . Diabetes Mother   . Heart disease Mother   . Fibromyalgia Mother   . Heart disease Father   . Hyperlipidemia Father   . Hypertension Father   . Diabetes Sister   . Brain cancer Brother   . Rheum arthritis Sister    Social History   Socioeconomic History  . Marital status: Married    Spouse name: Not on file  . Number of children: 2  . Years of education: Not on file  . Highest education level: 12th grade  Occupational History  . Occupation: Retired  . Occupation: Programmer, applications: [employer]  Social Needs  . Financial resource strain: Not hard at all  . Food insecurity    Worry: Never true    Inability: Never true  . Transportation needs  Medical: No    Non-medical: No  Tobacco Use  . Smoking status: Never Smoker  . Smokeless tobacco: Never Used  . Tobacco comment: smoking cessation materials not required  Substance and Sexual Activity  . Alcohol use: No    Alcohol/week: 0.0 standard drinks  . Drug use: No  . Sexual activity: Never  Lifestyle  . Physical activity    Days per week: 0 days    Minutes per session: 0 min  . Stress: To some extent  Relationships  . Social connections    Talks on phone: More than three times a week    Gets together: More than three times a week    Attends religious service:  More than 4 times per year    Active member of club or organization: No    Attends meetings of clubs or organizations: Never    Relationship status: Married  Other Topics Concern  . Not on file  Social History Narrative  . Not on file    Outpatient Encounter Medications as of 03/14/2019  Medication Sig  . amLODipine (NORVASC) 2.5 MG tablet Take 1 tablet (2.5 mg total) by mouth at bedtime.  Marland Kitchen amLODipine (NORVASC) 5 MG tablet Take 1 tablet (5 mg total) by mouth daily.  . Calcium Carb-Cholecalciferol (CALCIUM 600 + D PO) Take 1 capsule by mouth 2 (two) times daily.  . clopidogrel (PLAVIX) 75 MG tablet Take 1 tablet (75 mg total) by mouth daily.  . cyanocobalamin (,VITAMIN B-12,) 1000 MCG/ML injection INJECT 1 ML INTO THE MUSCLE EVERY 30 DAYS.  . fluticasone (FLONASE) 50 MCG/ACT nasal spray Place 2 sprays into both nostrils daily.  . hydrochlorothiazide (HYDRODIURIL) 12.5 MG tablet Take 1 tablet (12.5 mg total) by mouth daily.  Marland Kitchen levothyroxine (SYNTHROID, LEVOTHROID) 50 MCG tablet TAKE 1 TABLET(50 MCG) BY MOUTH DAILY  . losartan (COZAAR) 50 MG tablet Take 1 tablet (50 mg total) by mouth daily.  . Multiple Vitamins-Minerals (OCUVITE ADULT 50+ PO) Take 1 tablet by mouth daily.  . simvastatin (ZOCOR) 20 MG tablet TAKE 1 TABLET(20 MG) BY MOUTH DAILY  . vitamin C (ASCORBIC ACID) 500 MG tablet Take 500 mg by mouth daily.  . [DISCONTINUED] aspirin EC 81 MG EC tablet Take 1 tablet (81 mg total) by mouth daily.   No facility-administered encounter medications on file as of 03/14/2019.     Activities of Daily Living In your present state of health, do you have any difficulty performing the following activities: 03/14/2019 02/08/2019  Hearing? N N  Comment declines hearing aids -  Vision? N N  Comment wears glasses -  Difficulty concentrating or making decisions? N N  Walking or climbing stairs? N N  Dressing or bathing? N N  Doing errands, shopping? N N  Preparing Food and eating ? N -  Using the  Toilet? N -  In the past six months, have you accidently leaked urine? N -  Do you have problems with loss of bowel control? N -  Managing your Medications? N -  Managing your Finances? N -  Housekeeping or managing your Housekeeping? N -  Some recent data might be hidden    Patient Care Team: Juline Patch, MD as PCP - General (Family Medicine) Magnus Sinning, MD as Consulting Physician (Nephrology)    Assessment:   This is a routine wellness examination for Carly Marshall.  Exercise Activities and Dietary recommendations Current Exercise Habits: The patient does not participate in regular exercise at present, Exercise limited by:  cardiac condition(s)  Goals    . DIET - INCREASE WATER INTAKE     Recommend to drink at least 6-8 8oz glasses of water per day.       Fall Risk Fall Risk  03/14/2019 11/04/2018 03/10/2018 03/02/2017 11/20/2016  Falls in the past year? 1 0 Yes Yes No  Comment - - lost balance - -  Number falls in past yr: 1 - 1 1 -  Comment - - - in the snow  -  Injury with Fall? 0 - No Yes -  Risk for fall due to : - - Impaired vision - -  Risk for fall due to: Comment - - wears eyeglasses - -  Follow up Falls prevention discussed - Falls evaluation completed;Education provided;Falls prevention discussed Falls prevention discussed -   FALL RISK PREVENTION PERTAINING TO THE HOME:  Any stairs in or around the home? Yes  If so, do they handrails? Yes   Home free of loose throw rugs in walkways, pet beds, electrical cords, etc? Yes  Adequate lighting in your home to reduce risk of falls? Yes   ASSISTIVE DEVICES UTILIZED TO PREVENT FALLS:  Life alert? No  Use of a cane, walker or w/c? No  Grab bars in the bathroom? Yes  Shower chair or bench in shower? Yes  Elevated toilet seat or a handicapped toilet? Yes   DME ORDERS:  DME order needed?  No   TIMED UP AND GO:  Was the test performed? No . Telephonic visit.   Education: Fall risk prevention has been  discussed.  Intervention(s) required? No    Depression Screen PHQ 2/9 Scores 03/14/2019 02/15/2019 11/04/2018 03/10/2018  PHQ - 2 Score 0 0 0 0  PHQ- 9 Score 1 0 0 0     Cognitive Function     6CIT Screen 03/14/2019 03/10/2018 03/02/2017  What Year? 0 points 0 points 0 points  What month? 0 points 0 points 0 points  What time? 0 points 0 points 0 points  Count back from 20 0 points 0 points 0 points  Months in reverse 0 points 0 points 0 points  Repeat phrase 0 points 2 points 0 points  Total Score 0 2 0    Immunization History  Administered Date(s) Administered  . Influenza, High Dose Seasonal PF 05/26/2017, 05/04/2018  . Influenza,inj,Quad PF,6+ Mos 04/28/2016  . Influenza-Unspecified 05/12/2015  . PPD Test 05/29/2015  . Pneumococcal Polysaccharide-23 03/27/2015  . Tdap 03/27/2015    Qualifies for Shingles Vaccine? Yes . Due for Shingrix. Education has been provided regarding the importance of this vaccine. Pt has been advised to call insurance company to determine out of pocket expense. Advised may also receive vaccine at local pharmacy or Health Dept. Verbalized acceptance and understanding.  Tdap: Up to date  Flu Vaccine: Up to date  Pneumococcal Vaccine: Up to date    Screening Tests Health Maintenance  Topic Date Due  . INFLUENZA VACCINE  03/12/2019  . TETANUS/TDAP  03/26/2025  . DEXA SCAN  Completed  . PNA vac Low Risk Adult  Completed    Cancer Screenings:  Colorectal Screening: Completed 05/14/10. No longer required.   Mammogram: Completed 01/14/12. Repeat every year. Pt declines repeat screening at this time.    Bone Density: Completed 03/24/18. Results reflect OSTEOPENIA. Repeat every 2 years.    Lung Cancer Screening: (Low Dose CT Chest recommended if Age 68-80 years, 30 pack-year currently smoking OR have quit w/in 15years.) does not qualify.   Additional  Screening:  Hepatitis C Screening: no longer required  Vision Screening: Recommended annual  ophthalmology exams for early detection of glaucoma and other disorders of the eye. Is the patient up to date with their annual eye exam?  Yes  Who is the provider or what is the name of the office in which the pt attends annual eye exams? [facility] Screening: Recommended annual dental exams for proper oral hygiene  Community Resource Referral:  CRR required this visit?  No       Plan:     I have personally reviewed and addressed the Medicare Annual Wellness questionnaire and have noted the following in the patient's chart:  A. Medical and social history B. Use of alcohol, tobacco or illicit drugs  C. Current medications and supplements D. Functional ability and status E.  Nutritional status F.  Physical activity G. Advance directives H. List of other physicians I.  Hospitalizations, surgeries, and ER visits in previous 12 months J.  [place] such as hearing and vision if needed, cognitive and depression L. Referrals and appointments   In addition, I have reviewed and discussed with patient certain preventive protocols, quality metrics, and best practice recommendations. A written personalized care plan for preventive services as well as general preventive health recommendations were provided to patient.   Signed,  Clemetine Marker, LPN Nurse Health Advisor   Nurse Notes: pt doing well and appreciative of visit today. Her husband has recently been taken off dialysis and receiving hospice care at home. Pt scheduled for f/u for stroke 03/28/19.

## 2019-03-28 ENCOUNTER — Other Ambulatory Visit: Payer: Self-pay

## 2019-03-28 ENCOUNTER — Encounter: Payer: Self-pay | Admitting: Family Medicine

## 2019-03-28 ENCOUNTER — Ambulatory Visit (INDEPENDENT_AMBULATORY_CARE_PROVIDER_SITE_OTHER): Payer: Medicare Other | Admitting: Family Medicine

## 2019-03-28 VITALS — BP 136/68 | HR 76 | Ht 62.0 in | Wt 135.0 lb

## 2019-03-28 DIAGNOSIS — I10 Essential (primary) hypertension: Secondary | ICD-10-CM

## 2019-03-28 MED ORDER — AMLODIPINE BESYLATE 5 MG PO TABS: 5.0000 mg | ORAL_TABLET | Freq: Two times a day (BID) | ORAL | 0 refills | Status: DC

## 2019-03-28 NOTE — Progress Notes (Signed)
Date:  03/28/2019   Name:  Carly Marshall   DOB:  10/28/1935   MRN:  [id number]   Chief Complaint: Hypertension (added 2.5mg  amlodipine at night)  Hypertension This is a chronic problem. The current episode started more than 1 year ago. The problem has been gradually improving since onset. The problem is uncontrolled. Pertinent negatives include no anxiety, blurred vision, chest pain, headaches, malaise/fatigue, neck pain, orthopnea, palpitations, peripheral edema, PND, shortness of breath or sweats. There are no associated agents to hypertension. Past treatments include calcium channel blockers, diuretics and angiotensin blockers. The current treatment provides moderate improvement. There are no compliance problems.  There is no history of angina, kidney disease, CAD/MI, CVA, heart failure, left ventricular hypertrophy, PVD or retinopathy. There is no history of chronic renal disease, a hypertension causing med or renovascular disease.    Review of Systems  Constitutional: Negative.  Negative for chills, fatigue, fever, malaise/fatigue and unexpected weight change.  HENT: Negative for congestion, ear discharge, ear pain, rhinorrhea, sinus pressure, sneezing and sore throat.   Eyes: Negative for blurred vision, photophobia, pain, discharge, redness and itching.  Respiratory: Negative for cough, shortness of breath, wheezing and stridor.   Cardiovascular: Negative for chest pain, palpitations, orthopnea and PND.  Gastrointestinal: Negative for abdominal pain, blood in stool, constipation, diarrhea, nausea and vomiting.  Endocrine: Negative for cold intolerance, heat intolerance, polydipsia, polyphagia and polyuria.  Genitourinary: Negative for dysuria, flank pain, frequency, hematuria, menstrual problem, pelvic pain, urgency, vaginal bleeding and vaginal discharge.  Musculoskeletal: Negative for arthralgias, back pain, myalgias and neck pain.  Skin: Negative for rash.  Allergic/Immunologic:  Negative for environmental allergies and food allergies.  Neurological: Negative for dizziness, weakness, light-headedness, numbness and headaches.  Hematological: Negative for adenopathy. Does not bruise/bleed easily.  Psychiatric/Behavioral: Negative for dysphoric mood. The patient is not nervous/anxious.     Patient Active Problem List   Diagnosis Date Noted  . Aphasia 02/07/2019  . CKD (chronic kidney disease), stage III ([place]) 02/07/2019  . B12 deficiency 04/28/2016  . Essential hypertension 03/27/2015  . Hypothyroidism 03/27/2015  . Hyperlipidemia 03/27/2015  . Allergic rhinitis due to pollen 03/27/2015    No Known Allergies  Past Surgical History:  Procedure Laterality Date  . COLONOSCOPY  2012   normal- Dr Sonny Masters  . ECTOPIC PREGNANCY SURGERY      Social History   Tobacco Use  . Smoking status: Never Smoker  . Smokeless tobacco: Never Used  . Tobacco comment: smoking cessation materials not required  Substance Use Topics  . Alcohol use: No    Alcohol/week: 0.0 standard drinks  . Drug use: No     Medication list has been reviewed and updated.  Current Meds  Medication Sig  . amLODipine (NORVASC) 2.5 MG tablet Take 1 tablet (2.5 mg total) by mouth at bedtime.  Marland Kitchen amLODipine (NORVASC) 5 MG tablet Take 1 tablet (5 mg total) by mouth daily.  . Calcium Carb-Cholecalciferol (CALCIUM 600 + D PO) Take 1 capsule by mouth 2 (two) times daily.  . clopidogrel (PLAVIX) 75 MG tablet Take 1 tablet (75 mg total) by mouth daily.  . cyanocobalamin (,VITAMIN B-12,) 1000 MCG/ML injection INJECT 1 ML INTO THE MUSCLE EVERY 30 DAYS.  . fluticasone (FLONASE) 50 MCG/ACT nasal spray Place 2 sprays into both nostrils daily.  . hydrochlorothiazide (HYDRODIURIL) 12.5 MG tablet Take 1 tablet (12.5 mg total) by mouth daily.  Marland Kitchen levothyroxine (SYNTHROID, LEVOTHROID) 50 MCG tablet TAKE 1 TABLET(50 MCG) BY MOUTH DAILY  .  losartan (COZAAR) 50 MG tablet Take 1 tablet (50 mg total) by mouth daily.   . Multiple Vitamins-Minerals (OCUVITE ADULT 50+ PO) Take 1 tablet by mouth daily.  . simvastatin (ZOCOR) 20 MG tablet TAKE 1 TABLET(20 MG) BY MOUTH DAILY  . vitamin C (ASCORBIC ACID) 500 MG tablet Take 500 mg by mouth daily.    PHQ 2/9 Scores 03/14/2019 02/15/2019 11/04/2018 03/10/2018  PHQ - 2 Score 0 0 0 0  PHQ- 9 Score 1 0 0 0    BP Readings from Last 3 Encounters:  03/28/19 136/68  03/14/19 (!) 137/48  02/15/19 (!) 150/84    Physical Exam Vitals signs and nursing note reviewed.  Constitutional:      Appearance: She is well-developed and normal weight.  HENT:     Head: Normocephalic.     Right Ear: Tympanic membrane, ear canal and external ear normal.     Left Ear: Tympanic membrane, ear canal and external ear normal.     Nose: Nose normal.     Mouth/Throat:     Mouth: Mucous membranes are moist.  Eyes:     General: Lids are everted, no foreign bodies appreciated. No scleral icterus.       Left eye: No foreign body or hordeolum.     Conjunctiva/sclera: Conjunctivae normal.     Right eye: Right conjunctiva is not injected.     Left eye: Left conjunctiva is not injected.     Pupils: Pupils are equal, round, and reactive to light.  Neck:     Musculoskeletal: Normal range of motion and neck supple.     Thyroid: No thyromegaly.     Vascular: No JVD.     Trachea: No tracheal deviation.  Cardiovascular:     Rate and Rhythm: Normal rate and regular rhythm.     Heart sounds: Normal heart sounds, S1 normal and S2 normal. Heart sounds not distant. No murmur. No friction rub. No gallop. No S3 or S4 sounds.   Pulmonary:     Effort: Pulmonary effort is normal. No respiratory distress.     Breath sounds: Normal breath sounds. No wheezing, rhonchi or rales.  Abdominal:     General: Bowel sounds are normal.     Palpations: Abdomen is soft. There is no mass.     Tenderness: There is no abdominal tenderness. There is no guarding or rebound.  Musculoskeletal: Normal range of motion.         General: No tenderness.     Right lower leg: No edema.     Left lower leg: No edema.  Lymphadenopathy:     Cervical: No cervical adenopathy.  Skin:    General: Skin is warm.     Findings: No rash.  Neurological:     Mental Status: She is alert and oriented to person, place, and time.     Cranial Nerves: No cranial nerve deficit.     Deep Tendon Reflexes: Reflexes normal.  Psychiatric:        Mood and Affect: Mood is not anxious or depressed.     Wt Readings from Last 3 Encounters:  03/28/19 135 lb (61.2 kg)  03/14/19 133 lb (60.3 kg)  02/15/19 133 lb (60.3 kg)    BP 136/68   Pulse 76   Ht 5\' 2"  (1.575 m)   Wt 135 lb (61.2 kg)   BMI 24.69 kg/m   Assessment and Plan:  1. Essential hypertension Blood pressure readings have been a little elevated and patient and I  would both like to have her reduction morning to the normal range.  Therefore we will increase amlodipine to 5 mg twice a day and will repeat blood pressure reading in 3 months. - amLODipine (NORVASC) 5 MG tablet; Take 1 tablet (5 mg total) by mouth 2 (two) times daily.  Dispense: 180 tablet; Refill: 0

## 2019-05-04 ENCOUNTER — Other Ambulatory Visit: Payer: Self-pay | Admitting: Family Medicine

## 2019-05-04 DIAGNOSIS — E538 Deficiency of other specified B group vitamins: Secondary | ICD-10-CM

## 2019-05-09 ENCOUNTER — Other Ambulatory Visit: Payer: Self-pay

## 2019-05-09 DIAGNOSIS — E039 Hypothyroidism, unspecified: Secondary | ICD-10-CM

## 2019-05-09 DIAGNOSIS — E782 Mixed hyperlipidemia: Secondary | ICD-10-CM

## 2019-05-09 MED ORDER — LEVOTHYROXINE SODIUM 50 MCG PO TABS: ORAL_TABLET | ORAL | 0 refills | Status: DC

## 2019-05-09 MED ORDER — SIMVASTATIN 20 MG PO TABS: ORAL_TABLET | ORAL | 0 refills | Status: DC

## 2019-05-25 DIAGNOSIS — Z23 Encounter for immunization: Secondary | ICD-10-CM | POA: Diagnosis not present

## 2019-06-16 ENCOUNTER — Encounter: Payer: Self-pay | Admitting: Family Medicine

## 2019-06-16 ENCOUNTER — Other Ambulatory Visit: Payer: Self-pay

## 2019-06-16 ENCOUNTER — Ambulatory Visit (INDEPENDENT_AMBULATORY_CARE_PROVIDER_SITE_OTHER): Payer: Medicare Other | Admitting: Family Medicine

## 2019-06-16 VITALS — BP 120/70 | HR 72 | Ht 62.0 in | Wt 140.0 lb

## 2019-06-16 DIAGNOSIS — I10 Essential (primary) hypertension: Secondary | ICD-10-CM

## 2019-06-16 MED ORDER — HYDROCHLOROTHIAZIDE 12.5 MG PO TABS: 12.5000 mg | ORAL_TABLET | Freq: Every day | ORAL | 3 refills | Status: DC

## 2019-06-16 MED ORDER — AMLODIPINE BESYLATE 5 MG PO TABS: 5.0000 mg | ORAL_TABLET | Freq: Two times a day (BID) | ORAL | 0 refills | Status: DC

## 2019-06-16 MED ORDER — LOSARTAN POTASSIUM 50 MG PO TABS: 50.0000 mg | ORAL_TABLET | Freq: Every day | ORAL | 3 refills | Status: DC

## 2019-06-16 NOTE — Progress Notes (Signed)
Date:  06/16/2019   Name:  Carly Marshall   DOB:  February 22, 1936   MRN:  [id number]   Chief Complaint: Follow-up (increased Amlodipine from 2.5 to 5mg  last visit)  Hypertension This is a chronic problem. The current episode started more than 1 year ago. The problem has been gradually improving since onset. The problem is controlled. Pertinent negatives include no anxiety, blurred vision, chest pain, headaches, malaise/fatigue, neck pain, orthopnea, palpitations, peripheral edema, PND, shortness of breath or sweats. There are no associated agents to hypertension.    Review of Systems  Constitutional: Negative for chills, fever and malaise/fatigue.  HENT: Negative for drooling, ear discharge, ear pain and sore throat.   Eyes: Negative for blurred vision.  Respiratory: Negative for cough, shortness of breath and wheezing.   Cardiovascular: Negative for chest pain, palpitations, orthopnea, leg swelling and PND.  Gastrointestinal: Negative for abdominal pain, blood in stool, constipation, diarrhea and nausea.  Endocrine: Negative for polydipsia.  Genitourinary: Negative for dysuria, frequency, hematuria and urgency.  Musculoskeletal: Negative for back pain, myalgias and neck pain.  Skin: Negative for rash.  Allergic/Immunologic: Negative for environmental allergies.  Neurological: Negative for dizziness and headaches.  Hematological: Does not bruise/bleed easily.  Psychiatric/Behavioral: Negative for suicidal ideas. The patient is not nervous/anxious.     Patient Active Problem List   Diagnosis Date Noted  . Aphasia 02/07/2019  . CKD (chronic kidney disease), stage III 02/07/2019  . B12 deficiency 04/28/2016  . Essential hypertension 03/27/2015  . Hypothyroidism 03/27/2015  . Hyperlipidemia 03/27/2015  . Allergic rhinitis due to pollen 03/27/2015    No Known Allergies  Past Surgical History:  Procedure Laterality Date  . COLONOSCOPY  2012   normal- Dr Sonny Masters  . ECTOPIC  PREGNANCY SURGERY      Social History   Tobacco Use  . Smoking status: Never Smoker  . Smokeless tobacco: Never Used  . Tobacco comment: smoking cessation materials not required  Substance Use Topics  . Alcohol use: No    Alcohol/week: 0.0 standard drinks  . Drug use: No     Medication list has been reviewed and updated.  Current Meds  Medication Sig  . amLODipine (NORVASC) 5 MG tablet Take 1 tablet (5 mg total) by mouth 2 (two) times daily.  . Calcium Carb-Cholecalciferol (CALCIUM 600 + D PO) Take 1 capsule by mouth 2 (two) times daily.  . clopidogrel (PLAVIX) 75 MG tablet Take 1 tablet (75 mg total) by mouth daily.  . cyanocobalamin (,VITAMIN B-12,) 1000 MCG/ML injection ADMINISTER 1 ML IN THE MUSCLE EVERY 30 DAYS  . fluticasone (FLONASE) 50 MCG/ACT nasal spray Place 2 sprays into both nostrils daily.  . hydrochlorothiazide (HYDRODIURIL) 12.5 MG tablet Take 1 tablet (12.5 mg total) by mouth daily.  Marland Kitchen levothyroxine (SYNTHROID) 50 MCG tablet TAKE 1 TABLET(50 MCG) BY MOUTH DAILY  . losartan (COZAAR) 50 MG tablet Take 1 tablet (50 mg total) by mouth daily.  . Multiple Vitamins-Minerals (OCUVITE ADULT 50+ PO) Take 1 tablet by mouth daily.  . simvastatin (ZOCOR) 20 MG tablet TAKE 1 TABLET(20 MG) BY MOUTH DAILY  . vitamin C (ASCORBIC ACID) 500 MG tablet Take 500 mg by mouth daily.    PHQ 2/9 Scores 03/14/2019 02/15/2019 11/04/2018 03/10/2018  PHQ - 2 Score 0 0 0 0  PHQ- 9 Score 1 0 0 0    BP Readings from Last 3 Encounters:  06/16/19 120/70  03/28/19 136/68  03/14/19 (!) 137/48    Physical Exam Vitals signs  and nursing note reviewed.  Constitutional:      General: She is not in acute distress.    Appearance: She is not diaphoretic.  HENT:     Head: Normocephalic and atraumatic.     Right Ear: Tympanic membrane and external ear normal.     Left Ear: Tympanic membrane and external ear normal.     Nose: Nose normal.  Eyes:     General:        Right eye: No discharge.         Left eye: No discharge.     Conjunctiva/sclera: Conjunctivae normal.     Pupils: Pupils are equal, round, and reactive to light.  Neck:     Musculoskeletal: Normal range of motion and neck supple.     Thyroid: No thyromegaly.     Vascular: No JVD.  Cardiovascular:     Rate and Rhythm: Normal rate and regular rhythm.     Heart sounds: Normal heart sounds, S1 normal and S2 normal. No murmur. No systolic murmur. No diastolic murmur. No friction rub. No gallop. No S3 or S4 sounds.   Pulmonary:     Effort: Pulmonary effort is normal.     Breath sounds: Normal breath sounds.  Abdominal:     General: Bowel sounds are normal.     Palpations: Abdomen is soft. There is no mass.     Tenderness: There is no abdominal tenderness. There is no guarding.  Musculoskeletal: Normal range of motion.     Right lower leg: No edema.     Left lower leg: No edema.  Lymphadenopathy:     Cervical: No cervical adenopathy.  Skin:    General: Skin is warm and dry.  Neurological:     Mental Status: She is alert.     Deep Tendon Reflexes: Reflexes are normal and symmetric.     Wt Readings from Last 3 Encounters:  06/16/19 140 lb (63.5 kg)  03/28/19 135 lb (61.2 kg)  03/14/19 133 lb (60.3 kg)    BP 120/70   Pulse 72   Ht 5\' 2"  (1.575 m)   Wt 140 lb (63.5 kg)   BMI 25.61 kg/m   Assessment and Plan:  1. Essential hypertension Chronic.  Controlled.  Stable.  Today's blood pressure is 120/70 and there is no orthostatic changes by symptoms.  We will continue on hydrochlorothiazide 12.5 daily, amlodipine 5 mg twice a day, and losartan 50 mg once a day.  Will recheck in 6 months.  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 - hydrochlorothiazide (HYDRODIURIL) 12.5 MG tablet; Take 1 tablet (12.5 mg total) by mouth daily.  Dispense: 90 tablet; Refill: 3 - amLODipine (NORVASC) 5 MG tablet; Take 1 tablet (5 mg total) by mouth 2 (two)  times daily.  Dispense: 180 tablet; Refill: 0 - losartan (COZAAR) 50 MG tablet; Take 1 tablet (50 mg total) by mouth daily.  Dispense: 90 tablet; Refill: 3

## 2019-07-12 ENCOUNTER — Other Ambulatory Visit: Payer: Self-pay

## 2019-07-12 DIAGNOSIS — I679 Cerebrovascular disease, unspecified: Secondary | ICD-10-CM

## 2019-07-12 MED ORDER — CLOPIDOGREL BISULFATE 75 MG PO TABS: 75.0000 mg | ORAL_TABLET | Freq: Every day | ORAL | 2 refills | Status: DC

## 2019-08-17 DIAGNOSIS — R8290 Unspecified abnormal findings in urine: Secondary | ICD-10-CM | POA: Diagnosis not present

## 2019-08-17 DIAGNOSIS — N184 Chronic kidney disease, stage 4 (severe): Secondary | ICD-10-CM | POA: Diagnosis not present

## 2019-08-17 DIAGNOSIS — D631 Anemia in chronic kidney disease: Secondary | ICD-10-CM | POA: Diagnosis not present

## 2019-08-17 DIAGNOSIS — I10 Essential (primary) hypertension: Secondary | ICD-10-CM | POA: Diagnosis not present

## 2019-08-28 ENCOUNTER — Other Ambulatory Visit: Payer: Self-pay | Admitting: Family Medicine

## 2019-08-28 DIAGNOSIS — E538 Deficiency of other specified B group vitamins: Secondary | ICD-10-CM

## 2019-09-02 ENCOUNTER — Other Ambulatory Visit: Payer: Self-pay

## 2019-09-02 DIAGNOSIS — E039 Hypothyroidism, unspecified: Secondary | ICD-10-CM

## 2019-09-02 DIAGNOSIS — E538 Deficiency of other specified B group vitamins: Secondary | ICD-10-CM

## 2019-09-02 MED ORDER — CYANOCOBALAMIN 1000 MCG/ML IJ SOLN: INTRAMUSCULAR | 2 refills | Status: DC

## 2019-09-02 MED ORDER — LEVOTHYROXINE SODIUM 50 MCG PO TABS: ORAL_TABLET | ORAL | 0 refills | Status: DC

## 2019-09-03 DIAGNOSIS — Z20822 Contact with and (suspected) exposure to covid-19: Secondary | ICD-10-CM | POA: Diagnosis not present

## 2019-09-05 DIAGNOSIS — C44329 Squamous cell carcinoma of skin of other parts of face: Secondary | ICD-10-CM | POA: Diagnosis not present

## 2019-09-05 DIAGNOSIS — D0439 Carcinoma in situ of skin of other parts of face: Secondary | ICD-10-CM | POA: Diagnosis not present

## 2019-09-30 ENCOUNTER — Other Ambulatory Visit: Payer: Self-pay

## 2019-09-30 DIAGNOSIS — E782 Mixed hyperlipidemia: Secondary | ICD-10-CM

## 2019-09-30 MED ORDER — SIMVASTATIN 20 MG PO TABS: ORAL_TABLET | ORAL | 0 refills | Status: DC

## 2019-09-30 NOTE — Progress Notes (Unsigned)
Sent in Simv 20mg  to [facility]

## 2019-10-07 ENCOUNTER — Ambulatory Visit: Payer: Medicare HMO | Attending: Internal Medicine

## 2019-10-07 DIAGNOSIS — Z23 Encounter for immunization: Secondary | ICD-10-CM | POA: Insufficient documentation

## 2019-10-07 NOTE — Progress Notes (Signed)
   Covid-19 Vaccination Clinic  Name:  SHARETTA RICCHIO    MRN: [id number] DOB: 19-Mar-1936  10/07/2019  Ms. Hinnant was observed post Covid-19 immunization for 15 minutes without incidence. She was provided with Vaccine Information Sheet and instruction to access the V-Safe system.   Ms. Esses was instructed to call 911 with any severe reactions post vaccine: Marland Kitchen Difficulty breathing  . Swelling of your face and throat  . A fast heartbeat  . A bad rash all over your body  . Dizziness and weakness    Immunizations Administered    Name Date Dose VIS Date Route   Pfizer COVID-19 Vaccine 10/07/2019 12:35 PM 0.3 mL 07/22/2019 Intramuscular   Manufacturer: [place]   Lot: [id number]   [place]: [address]-2

## 2019-10-17 ENCOUNTER — Other Ambulatory Visit: Payer: Self-pay

## 2019-10-17 DIAGNOSIS — I679 Cerebrovascular disease, unspecified: Secondary | ICD-10-CM

## 2019-10-17 MED ORDER — CLOPIDOGREL BISULFATE 75 MG PO TABS: 75.0000 mg | ORAL_TABLET | Freq: Every day | ORAL | 2 refills | Status: DC

## 2019-11-01 ENCOUNTER — Other Ambulatory Visit: Payer: Self-pay

## 2019-11-01 DIAGNOSIS — I10 Essential (primary) hypertension: Secondary | ICD-10-CM

## 2019-11-01 DIAGNOSIS — I679 Cerebrovascular disease, unspecified: Secondary | ICD-10-CM

## 2019-11-01 MED ORDER — CLOPIDOGREL BISULFATE 75 MG PO TABS: 75.0000 mg | ORAL_TABLET | Freq: Every day | ORAL | 0 refills | Status: DC

## 2019-11-01 MED ORDER — HYDROCHLOROTHIAZIDE 12.5 MG PO TABS: 12.5000 mg | ORAL_TABLET | Freq: Every day | ORAL | 0 refills | Status: DC

## 2019-11-08 ENCOUNTER — Ambulatory Visit: Payer: Medicare HMO | Attending: Internal Medicine

## 2019-11-08 DIAGNOSIS — Z23 Encounter for immunization: Secondary | ICD-10-CM

## 2019-11-08 NOTE — Progress Notes (Signed)
   Covid-19 Vaccination Clinic  Name:  Carly Marshall    MRN: [id number] DOB: 10-23-1935  11/08/2019  [name] was observed post Covid-19 immunization for 15 minutes without incident. She was provided with Vaccine Information Sheet and instruction to access the V-Safe system.   [name] was instructed to call 911 with any severe reactions post vaccine: Marland Kitchen Difficulty breathing  . Swelling of face and throat  . A fast heartbeat  . A bad rash all over body  . Dizziness and weakness   Immunizations Administered    Name Date Dose VIS Date Route   Pfizer COVID-19 Vaccine 11/08/2019  1:03 PM 0.3 mL 07/22/2019 Intramuscular   Manufacturer: [place]   Lot: [id number]   [place]: [id number]

## 2019-11-13 ENCOUNTER — Other Ambulatory Visit: Payer: Self-pay | Admitting: Family Medicine

## 2019-11-13 DIAGNOSIS — I10 Essential (primary) hypertension: Secondary | ICD-10-CM

## 2019-11-21 ENCOUNTER — Encounter: Payer: Self-pay | Admitting: Family Medicine

## 2019-11-21 ENCOUNTER — Other Ambulatory Visit: Payer: Self-pay

## 2019-11-21 ENCOUNTER — Ambulatory Visit (INDEPENDENT_AMBULATORY_CARE_PROVIDER_SITE_OTHER): Payer: Medicare HMO | Admitting: Family Medicine

## 2019-11-21 VITALS — BP 122/70 | HR 64 | Ht 62.0 in | Wt 145.0 lb

## 2019-11-21 DIAGNOSIS — M545 Low back pain, unspecified: Secondary | ICD-10-CM

## 2019-11-21 DIAGNOSIS — M519 Unspecified thoracic, thoracolumbar and lumbosacral intervertebral disc disorder: Secondary | ICD-10-CM

## 2019-11-21 DIAGNOSIS — M546 Pain in thoracic spine: Secondary | ICD-10-CM

## 2019-11-21 DIAGNOSIS — T300 Burn of unspecified body region, unspecified degree: Secondary | ICD-10-CM | POA: Diagnosis not present

## 2019-11-21 MED ORDER — MELOXICAM 7.5 MG PO TABS: 7.5000 mg | ORAL_TABLET | Freq: Every day | ORAL | 0 refills | Status: DC

## 2019-11-21 MED ORDER — PREDNISONE 10 MG PO TABS: 10.0000 mg | ORAL_TABLET | Freq: Every day | ORAL | 0 refills | Status: DC

## 2019-11-21 MED ORDER — CYCLOBENZAPRINE HCL 5 MG PO TABS: 5.0000 mg | ORAL_TABLET | Freq: Three times a day (TID) | ORAL | 1 refills | Status: DC | PRN

## 2019-11-21 NOTE — Patient Instructions (Signed)
Burn Care, Adult A burn is an injury to the skin or the tissues under the skin. There are three types of burns:  First degree. These burns may cause the skin to be red and a bit swollen.  Second degree. These burns are very painful and cause the skin to be very red. The skin may also leak fluid, look shiny, and start to have blisters.  Third degree. These burns cause permanent damage. They turn the skin white or black and make it look charred, dry, and leathery. Taking care of your burn properly can help to prevent pain and infection. It can also help the burn to heal more quickly. How is this treated? Right after a burn:  Rinse or soak the burn under cool water. Do this for several minutes. Do not put ice on your burn. That can cause more damage.  Lightly cover the burn with a clean (sterile) cloth (dressing). Burn care  Raise (elevate) the injured area above the level of your heart while sitting or lying down.  Follow instructions from your doctor about: ? How to clean and take care of the burn. ? When to change and remove the cloth.  Check your burn every day for signs of infection. Check for: ? More redness, swelling, or pain. ? Warmth. ? Pus or a bad smell. Medicine   Take over-the-counter and prescription medicines only as told by your doctor.  If you were prescribed antibiotic medicine, take or apply it as told by your doctor. Do not stop using the antibiotic even if your condition improves. General instructions  To prevent infection: ? Do not put butter, oil, or other home treatments on the burn. ? Do not scratch or pick at the burn. ? Do not break any blisters. ? Do not peel skin.  Do not rub your burn, even when you are cleaning it.  Protect your burn from the sun. Contact a doctor if:  Your condition does not get better.  Your condition gets worse.  You have a fever.  Your burn looks different or starts to have black or red spots on it.  Your burn  feels warm to the touch.  Your pain is not controlled with medicine. Get help right away if:  You have redness, swelling, or pain at the site of the burn.  You have fluid, blood, or pus coming from your burn.  You have red streaks near the burn.  You have very bad pain. This information is not intended to replace advice given to you by your health care provider. Make sure you discuss any questions you have with your health care provider. Document Revised: 11/17/2018 Document Reviewed: 01/15/2016 Elsevier Patient Education  2020 Elsevier Inc.  

## 2019-11-21 NOTE — Progress Notes (Signed)
Date:  11/21/2019   Name:  Carly Marshall   DOB:  04/13/1936   MRN:  [id number]   Chief Complaint: Back Pain (back spasms in the mid to upper back. Bruising on the mid to lower part of back- pt denies falls)  Back Pain This is a new problem. The current episode started in the past 7 days (late wednesday). The problem occurs daily. The problem has been gradually improving since onset. The pain is present in the thoracic spine. The quality of the pain is described as aching. The pain is at a severity of 6/10. The pain is moderate. The symptoms are aggravated by twisting. Pertinent negatives include no abdominal pain, bladder incontinence, bowel incontinence, chest pain, dysuria, fever, headaches, leg pain, numbness, paresis, paresthesias, pelvic pain, perianal numbness, tingling, weakness or weight loss. 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 Treatments tried: tylenol /heating pad. The treatment provided mild relief.    Lab Results  Component Value Date   CREATININE 1.47 (H) 02/07/2019   BUN 29 (H) 02/07/2019   NA 139 02/07/2019   K 4.0 02/07/2019   CL 105 02/07/2019   CO2 27 02/07/2019   Lab Results  Component Value Date   CHOL 138 02/08/2019   HDL 53 02/08/2019   LDLCALC 67 02/08/2019   TRIG 91 02/08/2019   CHOLHDL 2.6 02/08/2019   Lab Results  Component Value Date   TSH 2.653 02/08/2019   Lab Results  Component Value Date   HGBA1C 5.8 (H) 02/08/2019   Lab Results  Component Value Date   WBC 5.7 02/07/2019   HGB 10.7 (L) 02/07/2019   HCT 33.9 (L) 02/07/2019   MCV 94.7 02/07/2019   PLT 196 02/07/2019   Lab Results  Component Value Date   ALT 13 02/07/2019   AST 18 02/07/2019   ALKPHOS 74 02/07/2019   BILITOT 0.7 02/07/2019     Review of Systems  Constitutional: Negative.  Negative for chills, fatigue, fever, unexpected weight change and weight loss.  HENT: Negative for congestion, ear discharge, ear pain, rhinorrhea, sinus pressure, sneezing and sore throat.   Eyes:  Negative for photophobia, pain, discharge, redness and itching.  Respiratory: Negative for cough, shortness of breath, wheezing and stridor.   Cardiovascular: Negative for chest pain.  Gastrointestinal: Negative for abdominal pain, blood in stool, bowel incontinence, constipation, diarrhea, nausea and vomiting.  Endocrine: Negative for cold intolerance, heat intolerance, polydipsia, polyphagia and polyuria.  Genitourinary: Negative for bladder incontinence, dysuria, flank pain, frequency, hematuria, menstrual problem, pelvic pain, urgency, vaginal bleeding and vaginal discharge.  Musculoskeletal: Positive for back pain. Negative for arthralgias and myalgias.  Skin: Negative for rash.  Allergic/Immunologic: Negative for environmental allergies and food allergies.  Neurological: Negative for dizziness, tingling, weakness, light-headedness, numbness, headaches and paresthesias.  Hematological: Negative for adenopathy. Does not bruise/bleed easily.  Psychiatric/Behavioral: Negative for dysphoric mood. The patient is not nervous/anxious.     Patient Active Problem List   Diagnosis Date Noted  . Aphasia 02/07/2019  . CKD (chronic kidney disease), stage III 02/07/2019  . B12 deficiency 04/28/2016  . Essential hypertension 03/27/2015  . Hypothyroidism 03/27/2015  . Hyperlipidemia 03/27/2015  . Allergic rhinitis due to pollen 03/27/2015    No Known Allergies  Past Surgical History:  Procedure Laterality Date  . COLONOSCOPY  2012   normal- Dr Sonny Masters  . ECTOPIC PREGNANCY SURGERY      Social History   Tobacco Use  . Smoking status: Never Smoker  . Smokeless tobacco: Never Used  .  Tobacco comment: smoking cessation materials not required  Substance Use Topics  . Alcohol use: No    Alcohol/week: 0.0 standard drinks  . Drug use: No     Medication list has been reviewed and updated.  Current Meds  Medication Sig  . amLODipine (NORVASC) 5 MG tablet Take 1 tablet (5 mg total) by  mouth 2 (two) times daily.  . Calcium Carb-Cholecalciferol (CALCIUM 600 + D PO) Take 1 capsule by mouth 2 (two) times daily.  . clopidogrel (PLAVIX) 75 MG tablet Take 1 tablet (75 mg total) by mouth daily.  . cyanocobalamin (,VITAMIN B-12,) 1000 MCG/ML injection 1 injection per month  . fluticasone (FLONASE) 50 MCG/ACT nasal spray Place 2 sprays into both nostrils daily.  . hydrochlorothiazide (HYDRODIURIL) 12.5 MG tablet TAKE 1 TABLET BY MOUTH DAILY  . levothyroxine (SYNTHROID) 50 MCG tablet TAKE 1 TABLET(50 MCG) BY MOUTH DAILY  . losartan (COZAAR) 50 MG tablet Take 1 tablet (50 mg total) by mouth daily.  . Multiple Vitamins-Minerals (OCUVITE ADULT 50+ PO) Take 1 tablet by mouth daily.  . simvastatin (ZOCOR) 20 MG tablet TAKE 1 TABLET(20 MG) BY MOUTH DAILY  . vitamin C (ASCORBIC ACID) 500 MG tablet Take 500 mg by mouth daily.    PHQ 2/9 Scores 11/21/2019 03/14/2019 02/15/2019 11/04/2018  PHQ - 2 Score 0 0 0 0  PHQ- 9 Score 0 1 0 0    BP Readings from Last 3 Encounters:  11/21/19 122/70  06/16/19 120/70  03/28/19 136/68    Physical Exam Vitals and nursing note reviewed.  Constitutional:      General: She is not in acute distress.    Appearance: She is not diaphoretic.  HENT:     Head: Normocephalic and atraumatic.     Right Ear: External ear normal.     Left Ear: External ear normal.     Nose: Nose normal.  Eyes:     General:        Right eye: No discharge.        Left eye: No discharge.     Conjunctiva/sclera: Conjunctivae normal.     Pupils: Pupils are equal, round, and reactive to light.  Neck:     Thyroid: No thyromegaly.     Vascular: No JVD.  Cardiovascular:     Rate and Rhythm: Normal rate and regular rhythm.     Heart sounds: Normal heart sounds. No murmur. No friction rub. No gallop.   Pulmonary:     Effort: Pulmonary effort is normal.     Breath sounds: Normal breath sounds.  Abdominal:     General: Bowel sounds are normal.     Palpations: Abdomen is soft. There  is no mass.     Tenderness: There is no abdominal tenderness. There is no guarding.  Musculoskeletal:        General: Normal range of motion.     Cervical back: Normal range of motion and neck supple.     Thoracic back: Spasms present.     Comments: First degree burn thoraco lumbar  Lymphadenopathy:     Cervical: No cervical adenopathy.  Skin:    General: Skin is warm and dry.     Findings: Burn present.       Neurological:     Mental Status: She is alert.     Deep Tendon Reflexes: Reflexes are normal and symmetric.     Wt Readings from Last 3 Encounters:  11/21/19 145 lb (65.8 kg)  06/16/19 140 lb (63.5  kg)  03/28/19 135 lb (61.2 kg)    BP 122/70   Pulse 64   Ht 5\' 2"  (1.575 m)   Wt 145 lb (65.8 kg)   BMI 26.52 kg/m   Assessment and Plan:  1. Thoracic disc disorder New onset.  Episodic.  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  Patient is prescribed cyclobenzaprine 5 mg 3 times a day as needed for severe spasm. - cyclobenzaprine (FLEXERIL) 5 MG tablet; Take 1 tablet (5 mg total) by mouth 3 (three) times daily as needed for muscle spasms.  Dispense: 30 tablet; Refill: 1  2. Thoracolumbar back pain Episodic.  Persistent.  This is likely caused by disc and will continue cyclobenzaprine 5 mg 3 times a day and meloxicam 7.5 mg daily. - cyclobenzaprine (FLEXERIL) 5 MG tablet; Take 1 tablet (5 mg total) by mouth 3 (three) times daily as needed for muscle spasms.  Dispense: 30 tablet; Refill: 1 - meloxicam (MOBIC) 7.5 MG tablet; Take 1 tablet (7.5 mg total) by mouth daily.  Dispense: 30 tablet; Refill: 0  3. First degree burn Acute.  Stable.  This is a first-degree burn secondary to a heating pad that was either too intense or left on too long.  This caused a first-degree burn across the thoracolumbar area.  We will use an anti-inflammatory approach with Mobic 7.5 mg and prednisone 5 mg once a day.  Care sheet was given to patient for follow-up. - meloxicam (MOBIC)  7.5 MG tablet; Take 1 tablet (7.5 mg total) by mouth daily.  Dispense: 30 tablet; Refill: 0 - predniSONE (DELTASONE) 10 MG tablet; Take 1 tablet (10 mg total) by mouth daily with breakfast.  Dispense: 30 tablet; Refill: 0

## 2019-11-22 ENCOUNTER — Other Ambulatory Visit: Payer: Self-pay | Admitting: Family Medicine

## 2019-11-22 DIAGNOSIS — E538 Deficiency of other specified B group vitamins: Secondary | ICD-10-CM

## 2019-11-25 ENCOUNTER — Other Ambulatory Visit: Payer: Self-pay | Admitting: Family Medicine

## 2019-11-25 DIAGNOSIS — E039 Hypothyroidism, unspecified: Secondary | ICD-10-CM

## 2019-11-30 DIAGNOSIS — H524 Presbyopia: Secondary | ICD-10-CM | POA: Diagnosis not present

## 2019-11-30 DIAGNOSIS — H40013 Open angle with borderline findings, low risk, bilateral: Secondary | ICD-10-CM | POA: Diagnosis not present

## 2019-11-30 DIAGNOSIS — Z0100 Encounter for examination of eyes and vision without abnormal findings: Secondary | ICD-10-CM | POA: Diagnosis not present

## 2019-12-14 ENCOUNTER — Other Ambulatory Visit: Payer: Self-pay | Admitting: Family Medicine

## 2019-12-14 DIAGNOSIS — T300 Burn of unspecified body region, unspecified degree: Secondary | ICD-10-CM

## 2019-12-14 NOTE — Telephone Encounter (Signed)
Requested medications are due for refill today? Yes - This Refill cannot be delegated.    Requested medications are on active medication list?  Yes  Last Refill:  11/21/2019   # 30 with no refills  Future visit scheduled? Yes - Tomorrow.   Notes to Clinic:  This Refill cannot be delegated.

## 2019-12-14 NOTE — Telephone Encounter (Signed)
Pt will need to be seen before refilling prednisone

## 2019-12-14 NOTE — Telephone Encounter (Signed)
Patient is due to come in tomorrow for appointment.

## 2019-12-15 ENCOUNTER — Other Ambulatory Visit: Payer: Self-pay | Admitting: Family Medicine

## 2019-12-15 ENCOUNTER — Other Ambulatory Visit: Payer: Self-pay

## 2019-12-15 ENCOUNTER — Ambulatory Visit (INDEPENDENT_AMBULATORY_CARE_PROVIDER_SITE_OTHER): Payer: Medicare HMO | Admitting: Family Medicine

## 2019-12-15 ENCOUNTER — Encounter: Payer: Self-pay | Admitting: Family Medicine

## 2019-12-15 VITALS — BP 124/70 | HR 80 | Ht 62.0 in | Wt 150.0 lb

## 2019-12-15 DIAGNOSIS — M545 Low back pain, unspecified: Secondary | ICD-10-CM

## 2019-12-15 DIAGNOSIS — E039 Hypothyroidism, unspecified: Secondary | ICD-10-CM

## 2019-12-15 DIAGNOSIS — M546 Pain in thoracic spine: Secondary | ICD-10-CM

## 2019-12-15 DIAGNOSIS — E538 Deficiency of other specified B group vitamins: Secondary | ICD-10-CM | POA: Diagnosis not present

## 2019-12-15 DIAGNOSIS — I679 Cerebrovascular disease, unspecified: Secondary | ICD-10-CM

## 2019-12-15 DIAGNOSIS — E782 Mixed hyperlipidemia: Secondary | ICD-10-CM

## 2019-12-15 DIAGNOSIS — I10 Essential (primary) hypertension: Secondary | ICD-10-CM

## 2019-12-15 DIAGNOSIS — T300 Burn of unspecified body region, unspecified degree: Secondary | ICD-10-CM

## 2019-12-15 MED ORDER — CYANOCOBALAMIN 1000 MCG/ML IJ SOLN: INTRAMUSCULAR | 5 refills | Status: DC

## 2019-12-15 MED ORDER — HYDROCHLOROTHIAZIDE 12.5 MG PO TABS: 12.5000 mg | ORAL_TABLET | Freq: Every day | ORAL | 1 refills | Status: DC

## 2019-12-15 MED ORDER — MELOXICAM 7.5 MG PO TABS: 7.5000 mg | ORAL_TABLET | Freq: Every day | ORAL | 2 refills | Status: DC

## 2019-12-15 MED ORDER — LOSARTAN POTASSIUM 50 MG PO TABS: 50.0000 mg | ORAL_TABLET | Freq: Every day | ORAL | 1 refills | Status: DC

## 2019-12-15 MED ORDER — AMLODIPINE BESYLATE 5 MG PO TABS: 5.0000 mg | ORAL_TABLET | Freq: Two times a day (BID) | ORAL | 1 refills | Status: DC

## 2019-12-15 MED ORDER — CLOPIDOGREL BISULFATE 75 MG PO TABS: 75.0000 mg | ORAL_TABLET | Freq: Every day | ORAL | 1 refills | Status: DC

## 2019-12-15 MED ORDER — SIMVASTATIN 20 MG PO TABS: ORAL_TABLET | ORAL | 1 refills | Status: DC

## 2019-12-15 MED ORDER — CYCLOBENZAPRINE HCL 5 MG PO TABS: 5.0000 mg | ORAL_TABLET | Freq: Three times a day (TID) | ORAL | 1 refills | Status: DC | PRN

## 2019-12-15 MED ORDER — LEVOTHYROXINE SODIUM 50 MCG PO TABS: ORAL_TABLET | ORAL | 1 refills | Status: DC

## 2019-12-15 NOTE — Progress Notes (Signed)
Date:  12/15/2019   Name:  Carly Marshall   DOB:  07-Oct-1935   MRN:  [id number]   Chief Complaint: Hyperlipidemia, Hypertension, Hypothyroidism, vitamin b 12 deficiency, cerebal vasc disease (takes plavix), and disc disorder (wants refill on meloxicam and cyclobezaprine)  Hyperlipidemia This is a chronic problem. The current episode started more than 1 year ago. The problem is controlled. Recent lipid tests were reviewed and are normal. She has no history of chronic renal disease, diabetes, hypothyroidism, liver disease, obesity or nephrotic syndrome. Factors aggravating her hyperlipidemia include thiazides. Pertinent negatives include no chest pain, focal sensory loss, focal weakness, leg pain, myalgias or shortness of breath. Current antihyperlipidemic treatment includes statins. The current treatment provides moderate improvement of lipids. There are no compliance problems.   Hypertension This is a chronic problem. The current episode started more than 1 year ago. The problem has been gradually improving since onset. The problem is controlled. Pertinent negatives include no anxiety, blurred vision, chest pain, headaches, malaise/fatigue, neck pain, orthopnea, palpitations, peripheral edema, PND, shortness of breath or sweats. Past treatments include diuretics, calcium channel blockers and angiotensin blockers. The current treatment provides moderate improvement. There are no compliance problems.  There is no history of angina, kidney disease, CAD/MI, CVA, heart failure, left ventricular hypertrophy, PVD or retinopathy. Identifiable causes of hypertension include a thyroid problem. There is no history of chronic renal disease, a hypertension causing med or renovascular disease.  Thyroid Problem Presents for follow-up visit. Patient reports no anxiety, cold intolerance, constipation, depressed mood, diarrhea, fatigue, hair loss, heat intolerance, hoarse voice, menstrual problem, palpitations, weight  gain or weight loss. The symptoms have been stable. Her past medical history is significant for hyperlipidemia. There is no history of diabetes or heart failure.  Back Pain This is a chronic problem. The current episode started more than 1 year ago. The problem occurs intermittently. The problem has been gradually improving since onset. The pain is present in the thoracic spine and lumbar spine. The quality of the pain is described as aching. The symptoms are aggravated by twisting, standing and bending. Pertinent negatives include no abdominal pain, bladder incontinence, bowel incontinence, chest pain, dysuria, fever, headaches, leg pain, numbness, paresis, paresthesias, pelvic pain, perianal numbness, tingling, weakness or weight loss. She has tried NSAIDs and muscle relaxant for the symptoms. The treatment provided moderate relief.    Lab Results  Component Value Date   CREATININE 1.47 (H) 02/07/2019   BUN 29 (H) 02/07/2019   NA 139 02/07/2019   K 4.0 02/07/2019   CL 105 02/07/2019   CO2 27 02/07/2019   Lab Results  Component Value Date   CHOL 138 02/08/2019   HDL 53 02/08/2019   LDLCALC 67 02/08/2019   TRIG 91 02/08/2019   CHOLHDL 2.6 02/08/2019   Lab Results  Component Value Date   TSH 2.653 02/08/2019   Lab Results  Component Value Date   HGBA1C 5.8 (H) 02/08/2019   Lab Results  Component Value Date   WBC 5.7 02/07/2019   HGB 10.7 (L) 02/07/2019   HCT 33.9 (L) 02/07/2019   MCV 94.7 02/07/2019   PLT 196 02/07/2019   Lab Results  Component Value Date   ALT 13 02/07/2019   AST 18 02/07/2019   ALKPHOS 74 02/07/2019   BILITOT 0.7 02/07/2019     Review of Systems  Constitutional: Negative.  Negative for chills, fatigue, fever, malaise/fatigue, unexpected weight change, weight gain and weight loss.  HENT: Negative for congestion, ear  discharge, ear pain, hoarse voice, rhinorrhea, sinus pressure, sneezing and sore throat.   Eyes: Negative for blurred vision,  photophobia, pain, discharge, redness and itching.  Respiratory: Negative for cough, shortness of breath, wheezing and stridor.   Cardiovascular: Negative for chest pain, palpitations, orthopnea and PND.  Gastrointestinal: Negative for abdominal pain, blood in stool, bowel incontinence, constipation, diarrhea, nausea and vomiting.  Endocrine: Negative for cold intolerance, heat intolerance, polydipsia, polyphagia and polyuria.  Genitourinary: Negative for bladder incontinence, dysuria, flank pain, frequency, hematuria, menstrual problem, pelvic pain, urgency, vaginal bleeding and vaginal discharge.  Musculoskeletal: Positive for back pain. Negative for arthralgias, myalgias and neck pain.  Skin: Negative for rash.  Allergic/Immunologic: Negative for environmental allergies and food allergies.  Neurological: Negative for dizziness, tingling, focal weakness, weakness, light-headedness, numbness, headaches and paresthesias.  Hematological: Negative for adenopathy. Does not bruise/bleed easily.  Psychiatric/Behavioral: Negative for dysphoric mood. The patient is not nervous/anxious.     Patient Active Problem List   Diagnosis Date Noted  . Aphasia 02/07/2019  . CKD (chronic kidney disease), stage III 02/07/2019  . B12 deficiency 04/28/2016  . Essential hypertension 03/27/2015  . Hypothyroidism 03/27/2015  . Hyperlipidemia 03/27/2015  . Allergic rhinitis due to pollen 03/27/2015    No Known Allergies  Past Surgical History:  Procedure Laterality Date  . COLONOSCOPY  2012   normal- Dr Sonny Masters  . ECTOPIC PREGNANCY SURGERY      Social History   Tobacco Use  . Smoking status: Never Smoker  . Smokeless tobacco: Never Used  . Tobacco comment: smoking cessation materials not required  Substance Use Topics  . Alcohol use: No    Alcohol/week: 0.0 standard drinks  . Drug use: No     Medication list has been reviewed and updated.  Current Meds  Medication Sig  . amLODipine (NORVASC)  5 MG tablet Take 1 tablet (5 mg total) by mouth 2 (two) times daily.  . Calcium Carb-Cholecalciferol (CALCIUM 600 + D PO) Take 1 capsule by mouth 2 (two) times daily.  . clopidogrel (PLAVIX) 75 MG tablet Take 1 tablet (75 mg total) by mouth daily.  . cyanocobalamin (,VITAMIN B-12,) 1000 MCG/ML injection 1 INJECTION PER MONTH  . cyclobenzaprine (FLEXERIL) 5 MG tablet Take 1 tablet (5 mg total) by mouth 3 (three) times daily as needed for muscle spasms.  . fluticasone (FLONASE) 50 MCG/ACT nasal spray Place 2 sprays into both nostrils daily.  . hydrochlorothiazide (HYDRODIURIL) 12.5 MG tablet TAKE 1 TABLET BY MOUTH DAILY  . levothyroxine (SYNTHROID) 50 MCG tablet TAKE 1 TABLET(50 MCG) BY MOUTH DAILY  . losartan (COZAAR) 50 MG tablet Take 1 tablet (50 mg total) by mouth daily.  . meloxicam (MOBIC) 7.5 MG tablet Take 1 tablet (7.5 mg total) by mouth daily.  . Multiple Vitamins-Minerals (OCUVITE ADULT 50+ PO) Take 1 tablet by mouth daily.  . predniSONE (DELTASONE) 10 MG tablet Take 1 tablet (10 mg total) by mouth daily with breakfast.  . simvastatin (ZOCOR) 20 MG tablet TAKE 1 TABLET(20 MG) BY MOUTH DAILY  . vitamin C (ASCORBIC ACID) 500 MG tablet Take 500 mg by mouth daily.    PHQ 2/9 Scores 12/15/2019 11/21/2019 03/14/2019 02/15/2019  PHQ - 2 Score 0 0 0 0  PHQ- 9 Score 0 0 1 0    BP Readings from Last 3 Encounters:  12/15/19 124/70  11/21/19 122/70  06/16/19 120/70    Physical Exam Vitals and nursing note reviewed.  Constitutional:      General: She is not  in acute distress.    Appearance: She is not diaphoretic.  HENT:     Head: Normocephalic and atraumatic.     Right Ear: External ear normal.     Left Ear: External ear normal.     Nose: Nose normal.  Eyes:     General:        Right eye: No discharge.        Left eye: No discharge.     Conjunctiva/sclera: Conjunctivae normal.     Pupils: Pupils are equal, round, and reactive to light.  Neck:     Thyroid: No thyromegaly.      Vascular: No JVD.  Cardiovascular:     Rate and Rhythm: Normal rate and regular rhythm.     Pulses: Normal pulses.     Heart sounds: Normal heart sounds, S1 normal and S2 normal. No murmur. No systolic murmur. No diastolic murmur. No friction rub. No gallop. No S3 or S4 sounds.   Pulmonary:     Effort: Pulmonary effort is normal.     Breath sounds: Normal breath sounds. No decreased breath sounds, wheezing, rhonchi or rales.  Abdominal:     General: Bowel sounds are normal.     Palpations: Abdomen is soft. There is no mass.     Tenderness: There is no abdominal tenderness. There is no guarding.  Musculoskeletal:        General: Normal range of motion.     Cervical back: Normal range of motion and neck supple.     Right lower leg: No edema.     Left lower leg: No edema.  Lymphadenopathy:     Cervical: No cervical adenopathy.  Skin:    General: Skin is warm and dry.  Neurological:     Mental Status: She is alert.     Deep Tendon Reflexes: Reflexes are normal and symmetric.     Wt Readings from Last 3 Encounters:  12/15/19 150 lb (68 kg)  11/21/19 145 lb (65.8 kg)  06/16/19 140 lb (63.5 kg)    BP 124/70   Pulse 80   Ht 5\' 2"  (1.575 m)   Wt 150 lb (68 kg)   BMI 27.44 kg/m   Assessment and Plan:  1. Essential hypertension Chronic.  Controlled.  Stable.  Continue amlodipine 5 mg 1 twice a day.  In addition continue hydrochlorothiazide 12.5 mg and losartan 50 mg once a day.  Will check renal function panel. - Comprehensive Metabolic Panel (CMET) - amLODipine (NORVASC) 5 MG tablet; Take 1 tablet (5 mg total) by mouth 2 (two) times daily.  Dispense: 180 tablet; Refill: 1 - hydrochlorothiazide (HYDRODIURIL) 12.5 MG tablet; Take 1 tablet (12.5 mg total) by mouth daily.  Dispense: 90 tablet; Refill: 1 - losartan (COZAAR) 50 MG tablet; Take 1 tablet (50 mg total) by mouth daily.  Dispense: 90 tablet; Refill: 1  2. Cerebral vascular disease .  Controlled.  Stable.  Continue Plavix  75 mg 1 a day. - clopidogrel (PLAVIX) 75 MG tablet; Take 1 tablet (75 mg total) by mouth daily.  Dispense: 90 tablet; Refill: 1  3. Thoracolumbar back pain Chronic.  Controlled.  Stable.  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  Patient will continue cyclobenzaprine 5 mg 1 tablet 3 times a day as needed for muscle spasms as well as meloxicam 7.5 mg once a day. - cyclobenzaprine (FLEXERIL) 5 MG tablet; Take 1 tablet (5 mg total) by  mouth 3 (three) times daily as needed for muscle spasms.  Dispense: 30 tablet; Refill: 1 - meloxicam (MOBIC) 7.5 MG tablet; Take 1 tablet (7.5 mg total) by mouth daily.  Dispense: 30 tablet; Refill: 2  4. B12 deficiency Chronic.  Controlled.  Stable.  Continue injectable B12 on a monthly basis. - cyanocobalamin (,VITAMIN B-12,) 1000 MCG/ML injection; 1 injection monthly  Dispense: 1 mL; Refill: 5  5. Hypothyroidism, unspecified type Chronic.  Controlled.  Stable.  Continue levothyroxine 50 mcg 1 a day.  Will check TSH to assess supplemental level. - levothyroxine (SYNTHROID) 50 MCG tablet; 1 tablet daily  Dispense: 90 tablet; Refill: 1 - TSH  6. First degree burn New onset.  Resolving.  Stable.  There is resolution of the first-degree burn except for pigmentation which will likely be residual. - meloxicam (MOBIC) 7.5 MG tablet; Take 1 tablet (7.5 mg total) by mouth daily.  Dispense: 30 tablet; Refill: 2  7. Mixed hyperlipidemia Chronic.  Controlled.  Stable.  Continue simvastatin 20 mg once a day. - simvastatin (ZOCOR) 20 MG tablet; TAKE 1 TABLET(20 MG) BY MOUTH DAILY  Dispense: 90 tablet; Refill: 1

## 2019-12-16 LAB — COMPREHENSIVE METABOLIC PANEL
ALT: 17 IU/L (ref 0–32)
AST: 17 IU/L (ref 0–40)
Albumin/Globulin Ratio: 1.6 (ref 1.2–2.2)
Albumin: 4.1 g/dL (ref 3.6–4.6)
Alkaline Phosphatase: 111 IU/L (ref 39–117)
BUN/Creatinine Ratio: 21 (ref 12–28)
BUN: 36 mg/dL — ABNORMAL HIGH (ref 8–27)
Bilirubin Total: 0.5 mg/dL (ref 0.0–1.2)
CO2: 24 mmol/L (ref 20–29)
Calcium: 9.5 mg/dL (ref 8.7–10.3)
Chloride: 99 mmol/L (ref 96–106)
Creatinine, Ser: 1.68 mg/dL — ABNORMAL HIGH (ref 0.57–1.00)
GFR calc Af Amer: 33 mL/min/{1.73_m2} — ABNORMAL LOW (ref >59)
GFR calc non Af Amer: 28 mL/min/{1.73_m2} — ABNORMAL LOW (ref >59)
Globulin, Total: 2.6 g/dL (ref 1.5–4.5)
Glucose: 170 mg/dL — ABNORMAL HIGH (ref 65–99)
Potassium: 4.6 mmol/L (ref 3.5–5.2)
Sodium: 137 mmol/L (ref 134–144)
Total Protein: 6.7 g/dL (ref 6.0–8.5)

## 2019-12-16 LAB — TSH: TSH: 0.986 u[IU]/mL (ref 0.450–4.500)

## 2019-12-18 ENCOUNTER — Other Ambulatory Visit: Payer: Self-pay | Admitting: Family Medicine

## 2019-12-18 DIAGNOSIS — E039 Hypothyroidism, unspecified: Secondary | ICD-10-CM

## 2019-12-18 NOTE — Telephone Encounter (Signed)
Called pt and informed her that PCP called in med to [facility]. Pt stated that she would like the pharmacy changed. Requested Prescriptions  Pending Prescriptions Disp Refills  . levothyroxine (SYNTHROID) 50 MCG tablet [Pharmacy Med Name: LEVOTHYROXINE 50 MCG TABLET] 90 tablet 1    Sig: TAKE 1 TABLET BY MOUTH EVERY DAY     Endocrinology:  Hypothyroid Agents Failed - 12/18/2019 12:32 PM      Failed - TSH needs to be rechecked within 3 months after an abnormal result. Refill until TSH is due.      Passed - TSH in normal range and within 360 days    TSH  Date Value Ref Range Status  12/15/2019 0.986 0.450 - 4.500 uIU/mL Final         Passed - Valid encounter within last 12 months    Recent Outpatient Visits          3 days ago Essential hypertension   [place], MD   3 weeks ago Thoracic disc disorder   [facility] Juline Patch, MD   6 months ago Essential hypertension   [place], MD   8 months ago Essential hypertension   [place], MD   10 months ago Cerebral vascular disease   [facility] Juline Patch, MD      Future Appointments            In 6 months Juline Patch, MD [facility]

## 2019-12-21 DIAGNOSIS — I10 Essential (primary) hypertension: Secondary | ICD-10-CM | POA: Diagnosis not present

## 2019-12-21 DIAGNOSIS — N184 Chronic kidney disease, stage 4 (severe): Secondary | ICD-10-CM | POA: Diagnosis not present

## 2019-12-21 DIAGNOSIS — D631 Anemia in chronic kidney disease: Secondary | ICD-10-CM | POA: Diagnosis not present

## 2019-12-25 DIAGNOSIS — E785 Hyperlipidemia, unspecified: Secondary | ICD-10-CM | POA: Diagnosis not present

## 2019-12-25 DIAGNOSIS — I10 Essential (primary) hypertension: Secondary | ICD-10-CM | POA: Diagnosis not present

## 2019-12-25 DIAGNOSIS — E039 Hypothyroidism, unspecified: Secondary | ICD-10-CM | POA: Diagnosis not present

## 2019-12-25 DIAGNOSIS — I679 Cerebrovascular disease, unspecified: Secondary | ICD-10-CM | POA: Diagnosis not present

## 2019-12-25 DIAGNOSIS — Z8249 Family history of ischemic heart disease and other diseases of the circulatory system: Secondary | ICD-10-CM | POA: Diagnosis not present

## 2019-12-25 DIAGNOSIS — Z7902 Long term (current) use of antithrombotics/antiplatelets: Secondary | ICD-10-CM | POA: Diagnosis not present

## 2019-12-25 DIAGNOSIS — Z833 Family history of diabetes mellitus: Secondary | ICD-10-CM | POA: Diagnosis not present

## 2019-12-25 DIAGNOSIS — R32 Unspecified urinary incontinence: Secondary | ICD-10-CM | POA: Diagnosis not present

## 2020-02-21 ENCOUNTER — Other Ambulatory Visit: Payer: Self-pay | Admitting: Family Medicine

## 2020-02-21 DIAGNOSIS — I10 Essential (primary) hypertension: Secondary | ICD-10-CM

## 2020-03-14 ENCOUNTER — Ambulatory Visit: Payer: Medicare Other

## 2020-03-19 DIAGNOSIS — Z86018 Personal history of other benign neoplasm: Secondary | ICD-10-CM | POA: Diagnosis not present

## 2020-03-19 DIAGNOSIS — L578 Other skin changes due to chronic exposure to nonionizing radiation: Secondary | ICD-10-CM | POA: Diagnosis not present

## 2020-03-19 DIAGNOSIS — L570 Actinic keratosis: Secondary | ICD-10-CM | POA: Diagnosis not present

## 2020-03-19 DIAGNOSIS — Z872 Personal history of diseases of the skin and subcutaneous tissue: Secondary | ICD-10-CM | POA: Diagnosis not present

## 2020-03-19 DIAGNOSIS — Z859 Personal history of malignant neoplasm, unspecified: Secondary | ICD-10-CM | POA: Diagnosis not present

## 2020-03-19 DIAGNOSIS — L821 Other seborrheic keratosis: Secondary | ICD-10-CM | POA: Diagnosis not present

## 2020-03-21 ENCOUNTER — Ambulatory Visit (INDEPENDENT_AMBULATORY_CARE_PROVIDER_SITE_OTHER): Payer: Medicare HMO

## 2020-03-21 DIAGNOSIS — Z0000 Encounter for general adult medical examination without abnormal findings: Secondary | ICD-10-CM

## 2020-03-21 NOTE — Patient Instructions (Signed)
Carly Marshall , Thank you for taking time to come for your Medicare Wellness Visit. I appreciate your ongoing commitment to your health goals. Please review the following plan we discussed and let me know if I can assist you in the future.   Screening recommendations/referrals: Colonoscopy: no longer required Mammogram: no longer required Bone Density: done 03/24/18 Recommended yearly ophthalmology/optometry visit for glaucoma screening and checkup Recommended yearly dental visit for hygiene and checkup  Vaccinations: Influenza vaccine: done 05/25/19 Pneumococcal vaccine: done 03/27/15 Tdap vaccine: done 03/27/15 Shingles vaccine: Shingrix discussed. Please contact your pharmacy for coverage information.    Covid-19: done 10/07/19 & 11/08/19  Conditions/risks identified: Recommend increasing physical activity.  Next appointment: Follow up in one year for your annual wellness visit    Preventive Care 65 Years and Older, Female Preventive care refers to lifestyle choices and visits with your health care provider that can promote health and wellness. What does preventive care include?  A yearly physical exam. This is also called an annual well check.  Dental exams once or twice a year.  Routine eye exams. Ask your health care provider how often you should have your eyes checked.  Personal lifestyle choices, including:  Daily care of your teeth and gums.  Regular physical activity.  Eating a healthy diet.  Avoiding tobacco and drug use.  Limiting alcohol use.  Practicing safe sex.  Taking low-dose aspirin every day.  Taking vitamin and mineral supplements as recommended by your health care provider. What happens during an annual well check? The services and screenings done by your health care provider during your annual well check will depend on your age, overall health, lifestyle risk factors, and family history of disease. Counseling  Your health care provider may ask you  questions about your:  Alcohol use.  Tobacco use.  Drug use.  Emotional well-being.  Home and relationship well-being.  Sexual activity.  Eating habits.  History of falls.  Memory and ability to understand (cognition).  Work and work Statistician.  Reproductive health. Screening  You may have the following tests or measurements:  Height, weight, and BMI.  Blood pressure.  Lipid and cholesterol levels. These may be checked every 5 years, or more frequently if you are over 82 years old.  Skin check.  Lung cancer screening. You may have this screening every year starting at age 82 if you have a 30-pack-year history of smoking and currently smoke or have quit within the past 15 years.  Fecal occult blood test (FOBT) of the stool. You may have this test every year starting at age 82.  Flexible sigmoidoscopy or colonoscopy. You may have a sigmoidoscopy every 5 years or a colonoscopy every 10 years starting at age 82.  Hepatitis C blood test.  Hepatitis B blood test.  Sexually transmitted disease (STD) testing.  Diabetes screening. This is done by checking your blood sugar (glucose) after you have not eaten for a while (fasting). You may have this done every 1-3 years.  Bone density scan. This is done to screen for osteoporosis. You may have this done starting at age 82.  Mammogram. This may be done every 1-2 years. Talk to your health care provider about how often you should have regular mammograms. Talk with your health care provider about your test results, treatment options, and if necessary, the need for more tests. Vaccines  Your health care provider may recommend certain vaccines, such as:  Influenza vaccine. This is recommended every year.  Tetanus, diphtheria, and  acellular pertussis (Tdap, Td) vaccine. You may need a Td booster every 10 years.  Zoster vaccine. You may need this after age 82.  Pneumococcal 13-valent conjugate (PCV13) vaccine. One dose is  recommended after age 48.  Pneumococcal polysaccharide (PPSV23) vaccine. One dose is recommended after age 82. Talk to your health care provider about which screenings and vaccines you need and how often you need them. This information is not intended to replace advice given to you by your health care provider. Make sure you discuss any questions you have with your health care provider. Document Released: 08/24/2015 Document Revised: 04/16/2016 Document Reviewed: 05/29/2015 Elsevier Interactive Patient Education  2017 [place] Prevention in the Home Falls can cause injuries. They can happen to people of all ages. There are many things you can do to make your home safe and to help prevent falls. What can I do on the outside of my home?  Regularly fix the edges of walkways and driveways and fix any cracks.  Remove anything that might make you trip as you walk through a door, such as a raised step or threshold.  Trim any bushes or trees on the path to your home.  Use bright outdoor lighting.  Clear any walking paths of anything that might make someone trip, such as rocks or tools.  Regularly check to see if handrails are loose or broken. Make sure that both sides of any steps have handrails.  Any raised decks and porches should have guardrails on the edges.  Have any leaves, snow, or ice cleared regularly.  Use sand or salt on walking paths during winter.  Clean up any spills in your garage right away. This includes oil or grease spills. What can I do in the bathroom?  Use night lights.  Install grab bars by the toilet and in the tub and shower. Do not use towel bars as grab bars.  Use non-skid mats or decals in the tub or shower.  If you need to sit down in the shower, use a plastic, non-slip stool.  Keep the floor dry. Clean up any water that spills on the floor as soon as it happens.  Remove soap buildup in the tub or shower regularly.  Attach bath mats  securely with double-sided non-slip rug tape.  Do not have throw rugs and other things on the floor that can make you trip. What can I do in the bedroom?  Use night lights.  Make sure that you have a light by your bed that is easy to reach.  Do not use any sheets or blankets that are too big for your bed. They should not hang down onto the floor.  Have a firm chair that has side arms. You can use this for support while you get dressed.  Do not have throw rugs and other things on the floor that can make you trip. What can I do in the kitchen?  Clean up any spills right away.  Avoid walking on wet floors.  Keep items that you use a lot in easy-to-reach places.  If you need to reach something above you, use a strong step stool that has a grab bar.  Keep electrical cords out of the way.  Do not use floor polish or wax that makes floors slippery. If you must use wax, use non-skid floor wax.  Do not have throw rugs and other things on the floor that can make you trip. What can I do with my stairs?  Do not leave any items on the stairs.  Make sure that there are handrails on both sides of the stairs and use them. Fix handrails that are broken or loose. Make sure that handrails are as long as the stairways.  Check any carpeting to make sure that it is firmly attached to the stairs. Fix any carpet that is loose or worn.  Avoid having throw rugs at the top or bottom of the stairs. If you do have throw rugs, attach them to the floor with carpet tape.  Make sure that you have a light switch at the top of the stairs and the bottom of the stairs. If you do not have them, ask someone to add them for you. What else can I do to help prevent falls?  Wear shoes that:  Do not have high heels.  Have rubber bottoms.  Are comfortable and fit you well.  Are closed at the toe. Do not wear sandals.  If you use a stepladder:  Make sure that it is fully opened. Do not climb a closed  stepladder.  Make sure that both sides of the stepladder are locked into place.  Ask someone to hold it for you, if possible.  Clearly mark and make sure that you can see:  Any grab bars or handrails.  First and last steps.  Where the edge of each step is.  Use tools that help you move around (mobility aids) if they are needed. These include:  Canes.  Walkers.  Scooters.  Crutches.  Turn on the lights when you go into a dark area. Replace any light bulbs as soon as they burn out.  Set up your furniture so you have a clear path. Avoid moving your furniture around.  If any of your floors are uneven, fix them.  If there are any pets around you, be aware of where they are.  Review your medicines with your doctor. Some medicines can make you feel dizzy. This can increase your chance of falling. Ask your doctor what other things that you can do to help prevent falls. This information is not intended to replace advice given to you by your health care provider. Make sure you discuss any questions you have with your health care provider. Document Released: 05/24/2009 Document Revised: 01/03/2016 Document Reviewed: 09/01/2014 Elsevier Interactive Patient Education  2017 [employer].

## 2020-03-21 NOTE — Progress Notes (Signed)
Subjective:   Carly Marshall is a 84 y.o. female who presents for Medicare Annual (Subsequent) preventive examination.  Virtual Visit via Telephone Note  I connected with  Carly Marshall on 03/21/20 at  1:20 PM EDT by telephone and verified that I am speaking with the correct person using two identifiers.  Medicare Annual Wellness visit completed telephonically due to Covid-19 pandemic.   Location: Patient: home Provider: [facility]   I discussed the limitations, risks, security and privacy concerns of performing an evaluation and management service by telephone and the availability of in person appointments. The patient expressed understanding and agreed to proceed.  Unable to perform video visit due to video visit attempted and failed and/or patient does not have video capability.   Some vital signs may be absent or patient reported.   Clemetine Marker, LPN    Review of Systems     Cardiac Risk Factors include: advanced age (>76men, >21 women);hypertension     Objective:    There were no vitals filed for this visit. There is no height or weight on file to calculate BMI.  Advanced Directives 03/21/2020 03/14/2019 02/08/2019 02/07/2019 03/10/2018 03/02/2017 03/27/2015  Does Patient Have a Medical Advance Directive? Yes Yes No No No No No  Type of Paramedic of [place];Living will [place];Living will - - - - -  Copy of [place] in Chart? Yes - validated most recent copy scanned in chart (See row information) No - copy requested - - - - -  Would patient like information on creating a medical advance directive? - - No - Patient declined No - Patient declined Yes (MAU/Ambulatory/Procedural Areas - Information given) Yes (MAU/Ambulatory/Procedural Areas - Information given) -    Current Medications (verified) Outpatient Encounter Medications as of 03/21/2020  Medication Sig  . amLODipine (NORVASC) 5 MG tablet Take 1 tablet (5 mg  total) by mouth 2 (two) times daily.  . Calcium Carb-Cholecalciferol (CALCIUM 600 + D PO) Take 1 capsule by mouth 2 (two) times daily.  . clopidogrel (PLAVIX) 75 MG tablet Take 1 tablet (75 mg total) by mouth daily.  . cyanocobalamin (,VITAMIN B-12,) 1000 MCG/ML injection 1 injection monthly  . fluticasone (FLONASE) 50 MCG/ACT nasal spray Place 2 sprays into both nostrils daily.  . hydrochlorothiazide (HYDRODIURIL) 12.5 MG tablet Take 1 tablet (12.5 mg total) by mouth daily.  Marland Kitchen levothyroxine (SYNTHROID) 50 MCG tablet TAKE 1 TABLET BY MOUTH EVERY DAY  . losartan (COZAAR) 50 MG tablet Take 1 tablet (50 mg total) by mouth daily.  . Multiple Vitamins-Minerals (OCUVITE ADULT 50+ PO) Take 1 tablet by mouth daily.  . simvastatin (ZOCOR) 20 MG tablet TAKE 1 TABLET(20 MG) BY MOUTH DAILY  . vitamin C (ASCORBIC ACID) 500 MG tablet Take 500 mg by mouth daily.  . cyclobenzaprine (FLEXERIL) 5 MG tablet Take 1 tablet (5 mg total) by mouth 3 (three) times daily as needed for muscle spasms. (Patient not taking: Reported on 03/21/2020)  . [DISCONTINUED] meloxicam (MOBIC) 7.5 MG tablet Take 1 tablet (7.5 mg total) by mouth daily.  . [DISCONTINUED] predniSONE (DELTASONE) 10 MG tablet Take 1 tablet (10 mg total) by mouth daily with breakfast.   No facility-administered encounter medications on file as of 03/21/2020.    Allergies (verified) Patient has no known allergies.   History: Past Medical History:  Diagnosis Date  . Allergy   . B12 deficiency anemia   . Hyperlipidemia   . Hypertension   .  Stroke ([place])   . Thyroid disease    Past Surgical History:  Procedure Laterality Date  . COLONOSCOPY  2012   normal- Dr Sonny Masters  . ECTOPIC PREGNANCY SURGERY     Family History  Problem Relation Age of Onset  . Diabetes Mother   . Heart disease Mother   . Fibromyalgia Mother   . Heart disease Father   . Hyperlipidemia Father   . Hypertension Father   . Diabetes Sister   . Brain cancer Brother   . Rheum  arthritis Sister    Social History   Socioeconomic History  . Marital status: Married    Spouse name: Not on file  . Number of children: 2  . Years of education: Not on file  . Highest education level: 12th grade  Occupational History  . Occupation: Retired  . Occupation: Programmer, applications: [employer]  Tobacco Use  . Smoking status: Never Smoker  . Smokeless tobacco: Never Used  . Tobacco comment: smoking cessation materials not required  Vaping Use  . Vaping Use: Never used  Substance and Sexual Activity  . Alcohol use: No    Alcohol/week: 0.0 standard drinks  . Drug use: No  . Sexual activity: Never  Other Topics Concern  . Not on file  Social History Narrative  . Not on file   Social Determinants of Health   Financial Resource Strain: Low Risk   . Difficulty of Paying Living Expenses: Not hard at all  Food Insecurity: No Food Insecurity  . Worried About Charity fundraiser in the Last Year: Never true  . Ran Out of Food in the Last Year: Never true  Transportation Needs: No Transportation Needs  . Lack of Transportation (Medical): No  . Lack of Transportation (Non-Medical): No  Physical Activity: Inactive  . Days of Exercise per Week: 0 days  . Minutes of Exercise per Session: 0 min  Stress: No Stress Concern Present  . Feeling of Stress : Not at all  Social Connections: Moderately Isolated  . Frequency of Communication with Friends and Family: More than three times a week  . Frequency of Social Gatherings with Friends and Family: More than three times a week  . Attends Religious Services: More than 4 times per year  . Active Member of Clubs or Organizations: No  . Attends Archivist Meetings: Never  . Marital Status: Widowed    Tobacco Counseling Counseling given: Not Answered Comment: smoking cessation materials not required   Clinical Intake:  Pre-visit preparation completed: Yes  Pain : No/denies pain      Nutritional Risks: None Diabetes: No  How often do you need to have someone help you when you read instructions, pamphlets, or other written materials from your doctor or pharmacy?: 1 - Never    Interpreter Needed?: No  Information entered by :: Clemetine Marker LPN   Activities of Daily Living In your present state of health, do you have any difficulty performing the following activities: 03/21/2020  Hearing? N  Comment declines hearing aids  Vision? N  Difficulty concentrating or making decisions? N  Walking or climbing stairs? N  Dressing or bathing? N  Doing errands, shopping? N  Preparing Food and eating ? N  Using the Toilet? N  In the past six months, have you accidently leaked urine? N  Do you have problems with loss of bowel control? N  Managing your Medications? N  Managing your Finances? N  Housekeeping or  managing your Housekeeping? N  Some recent data might be hidden    Patient Care Team: Juline Patch, MD as PCP - General (Family Medicine) Magnus Sinning, MD as Consulting Physician (Nephrology)  Indicate any recent Medical Services you may have received from other than [facility] providers in the past year (date may be approximate).     Assessment:   This is a routine wellness examination for Carly Marshall.  Hearing/Vision screen  Hearing Screening   125Hz  250Hz  500Hz  1000Hz  2000Hz  3000Hz  4000Hz  6000Hz  8000Hz   Right ear:           Left ear:           Comments:  Pt denies hearing difficulty  Vision Screening Comments: Annual vision screenings done [facility]  Dietary issues and exercise activities discussed: Current Exercise Habits: The patient does not participate in regular exercise at present, Exercise limited by: orthopedic condition(s)  Goals    . DIET - INCREASE WATER INTAKE     Recommend to drink at least 6-8 8oz glasses of water per day.      Depression Screen PHQ 2/9 Scores 03/21/2020 12/15/2019 11/21/2019 03/14/2019 02/15/2019 11/04/2018  03/10/2018  PHQ - 2 Score 0 0 0 0 0 0 0  PHQ- 9 Score - 0 0 1 0 0 0    Fall Risk Fall Risk  03/21/2020 12/15/2019 11/21/2019 03/14/2019 11/04/2018  Falls in the past year? 0 0 0 1 0  Comment - - - - -  Number falls in past yr: 0 - - 1 -  Comment - - - - -  Injury with Fall? 0 - - 0 -  Risk for fall due to : No Fall Risks - - - -  Risk for fall due to: Comment - - - - -  Follow up Falls prevention discussed Falls evaluation completed Falls evaluation completed Falls prevention discussed -    Any stairs in or around the home? Yes  If so, are there any without handrails? Yes  2 steps outside  Home free of loose throw rugs in walkways, pet beds, electrical cords, etc? Yes  Adequate lighting in your home to reduce risk of falls? Yes   ASSISTIVE DEVICES UTILIZED TO PREVENT FALLS:  Life alert? No   Use of a cane, walker or w/c? No  Grab bars in the bathroom? Yes  Shower chair or bench in shower? Yes  Elevated toilet seat or a handicapped toilet? Yes   TIMED UP AND GO:  Was the test performed? No . Telephonic visit.   Cognitive Function:     6CIT Screen 03/21/2020 03/14/2019 03/10/2018 03/02/2017  What Year? 0 points 0 points 0 points 0 points  What month? 0 points 0 points 0 points 0 points  What time? 0 points 0 points 0 points 0 points  Count back from 20 0 points 0 points 0 points 0 points  Months in reverse 0 points 0 points 0 points 0 points  Repeat phrase 0 points 0 points 2 points 0 points  Total Score 0 0 2 0    Immunizations Immunization History  Administered Date(s) Administered  . Influenza, High Dose Seasonal PF 05/26/2017, 05/04/2018, 05/25/2019  . Influenza,inj,Quad PF,6+ Mos 04/28/2016  . Influenza-Unspecified 05/12/2015  . PFIZER SARS-COV-2 Vaccination 10/07/2019, 11/08/2019  . PPD Test 05/29/2015  . Pneumococcal Polysaccharide-23 03/27/2015  . Tdap 03/27/2015    TDAP status: Up to date   Flu Vaccine status: Up to date   Pneumococcal vaccine status: Up  to  date   Covid-19 vaccine status: Completed vaccines  Qualifies for Shingles Vaccine? Yes   Zostavax completed No   Shingrix Completed?: No.    Education has been provided regarding the importance of this vaccine. Patient has been advised to call insurance company to determine out of pocket expense if they have not yet received this vaccine. Advised may also receive vaccine at local pharmacy or Health Dept. Verbalized acceptance and understanding.  Screening Tests Health Maintenance  Topic Date Due  . INFLUENZA VACCINE  03/11/2020  . TETANUS/TDAP  03/26/2025  . DEXA SCAN  Completed  . COVID-19 Vaccine  Completed  . PNA vac Low Risk Adult  Completed    Health Maintenance  Health Maintenance Due  Topic Date Due  . INFLUENZA VACCINE  03/11/2020    Colorectal cancer screening: No longer required.    Mammogram status: No longer required.    Bone Density status: Completed 03/24/18. Results reflect: Bone density results: OSTEOPOROSIS. Repeat every 2 years. Declines repeat screening at this time.   Lung Cancer Screening: (Low Dose CT Chest recommended if Age 82-80 years, 30 pack-year currently smoking OR have quit w/in 15years.) does not qualify.   Additional Screening:  Hepatitis C Screening: no longer required  Vision Screening: Recommended annual ophthalmology exams for early detection of glaucoma and other disorders of the eye. Is the patient up to date with their annual eye exam?  Yes  Who is the provider or what is the name of the office in which the patient attends annual eye exams? [place] Screening: Recommended annual dental exams for proper oral hygiene  Community Resource Referral / Chronic Care Management: CRR required this visit?  No   CCM required this visit?  No      Plan:     I have personally reviewed and noted the following in the patient's chart:   . Medical and social history . Use of alcohol, tobacco or illicit drugs  . Current  medications and supplements . Functional ability and status . Nutritional status . Physical activity . Advanced directives . List of other physicians . Hospitalizations, surgeries, and ER visits in previous 12 months . Vitals . Screenings to include cognitive, depression, and falls . Referrals and appointments  In addition, I have reviewed and discussed with patient certain preventive protocols, quality metrics, and best practice recommendations. A written personalized care plan for preventive services as well as general preventive health recommendations were provided to patient.     Clemetine Marker, LPN   [id number]   Nurse Notes: pt c/o swelling in her feet and lower legs for the past month that goes down overnight. Pt reports home blood pressure readings within normal limits, avoid salty foods or added salt and drinking plenty of water. She also c/o ongoing dry cough and lately has felt more easily fatigued. Pt advised to schedule appt to discuss with Dr. Ronnald Ramp.

## 2020-03-23 ENCOUNTER — Ambulatory Visit (INDEPENDENT_AMBULATORY_CARE_PROVIDER_SITE_OTHER): Payer: Medicare HMO | Admitting: Family Medicine

## 2020-03-23 ENCOUNTER — Other Ambulatory Visit: Payer: Self-pay

## 2020-03-23 ENCOUNTER — Encounter: Payer: Self-pay | Admitting: Family Medicine

## 2020-03-23 VITALS — BP 128/72 | HR 72 | Ht 62.0 in | Wt 152.0 lb

## 2020-03-23 DIAGNOSIS — J3089 Other allergic rhinitis: Secondary | ICD-10-CM

## 2020-03-23 DIAGNOSIS — N184 Chronic kidney disease, stage 4 (severe): Secondary | ICD-10-CM | POA: Diagnosis not present

## 2020-03-23 DIAGNOSIS — I872 Venous insufficiency (chronic) (peripheral): Secondary | ICD-10-CM

## 2020-03-23 DIAGNOSIS — I10 Essential (primary) hypertension: Secondary | ICD-10-CM

## 2020-03-23 MED ORDER — AMLODIPINE BESYLATE 5 MG PO TABS: 5.0000 mg | ORAL_TABLET | Freq: Two times a day (BID) | ORAL | 1 refills | Status: DC

## 2020-03-23 MED ORDER — HYDROCHLOROTHIAZIDE 12.5 MG PO TABS: 12.5000 mg | ORAL_TABLET | Freq: Every day | ORAL | 1 refills | Status: DC

## 2020-03-23 MED ORDER — FLUTICASONE PROPIONATE 50 MCG/ACT NA SUSP: 2.0000 | Freq: Every day | NASAL | 11 refills | Status: DC

## 2020-03-23 MED ORDER — LOSARTAN POTASSIUM 50 MG PO TABS: 50.0000 mg | ORAL_TABLET | Freq: Every day | ORAL | 1 refills | Status: DC

## 2020-03-23 NOTE — Progress Notes (Signed)
Date:  03/23/2020   Name:  Carly Marshall   DOB:  [birth year]   MRN:  [id number]   Chief Complaint: Joint Swelling and Allergic Rhinitis  (refill nasal spray)  uri  URI  This is a chronic problem. The current episode started more than 1 month ago. The problem has been gradually improving. There has been no fever. Associated symptoms include joint swelling. Pertinent negatives include no abdominal pain, chest pain, congestion, coughing, diarrhea, dysuria, ear pain, headaches, joint pain, nausea, neck pain, plugged ear sensation, rash, rhinorrhea, sinus pain, sneezing, sore throat, swollen glands, vomiting or wheezing. She has tried nothing for the symptoms. The treatment provided mild relief.  Arthritis Presents for follow-up visit. She reports no pain, stiffness, joint swelling or joint warmth. The symptoms have been improving. Affected location: general. Pertinent negatives include no diarrhea, dry eyes, dry mouth, dysuria, fatigue, fever, pain at night, pain while resting, rash, Raynaud's syndrome, uveitis or weight loss.    Lab Results  Component Value Date   CREATININE 1.68 (H) 12/15/2019   BUN 36 (H) 12/15/2019   NA 137 12/15/2019   K 4.6 12/15/2019   CL 99 12/15/2019   CO2 24 12/15/2019   Lab Results  Component Value Date   CHOL 138 02/08/2019   HDL 53 02/08/2019   LDLCALC 67 02/08/2019   TRIG 91 02/08/2019   CHOLHDL 2.6 02/08/2019   Lab Results  Component Value Date   TSH 0.986 12/15/2019   Lab Results  Component Value Date   HGBA1C 5.8 (H) 02/08/2019   Lab Results  Component Value Date   WBC 5.7 02/07/2019   HGB 10.7 (L) 02/07/2019   HCT 33.9 (L) 02/07/2019   MCV 94.7 02/07/2019   PLT 196 02/07/2019   Lab Results  Component Value Date   ALT 17 12/15/2019   AST 17 12/15/2019   ALKPHOS 111 12/15/2019   BILITOT 0.5 12/15/2019     Review of Systems  Constitutional: Negative for fatigue, fever and weight loss.  HENT: Negative for congestion, ear pain,  rhinorrhea, sinus pain, sneezing and sore throat.   Respiratory: Negative for cough and wheezing.   Cardiovascular: Negative for chest pain.  Gastrointestinal: Negative for abdominal pain, diarrhea, nausea and vomiting.  Genitourinary: Negative for dysuria.  Musculoskeletal: Positive for arthritis. Negative for joint pain, joint swelling, neck pain and stiffness.  Skin: Negative for rash.  Neurological: Negative for headaches.    Patient Active Problem List   Diagnosis Date Noted  . Aphasia 02/07/2019  . CKD (chronic kidney disease), stage III 02/07/2019  . B12 deficiency 04/28/2016  . Essential hypertension 03/27/2015  . Hypothyroidism 03/27/2015  . Hyperlipidemia 03/27/2015  . Allergic rhinitis due to pollen 03/27/2015    No Known Allergies  Past Surgical History:  Procedure Laterality Date  . COLONOSCOPY  2012   normal- Dr Sonny Masters  . ECTOPIC PREGNANCY SURGERY      Social History   Tobacco Use  . Smoking status: Never Smoker  . Smokeless tobacco: Never Used  . Tobacco comment: smoking cessation materials not required  Vaping Use  . Vaping Use: Never used  Substance Use Topics  . Alcohol use: No    Alcohol/week: 0.0 standard drinks  . Drug use: No     Medication list has been reviewed and updated.  Current Meds  Medication Sig  . amLODipine (NORVASC) 5 MG tablet Take 1 tablet (5 mg total) by mouth 2 (two) times daily.  . Calcium Carb-Cholecalciferol (CALCIUM 600 +  D PO) Take 1 capsule by mouth 2 (two) times daily.  . clopidogrel (PLAVIX) 75 MG tablet Take 1 tablet (75 mg total) by mouth daily.  . cyanocobalamin (,VITAMIN B-12,) 1000 MCG/ML injection 1 injection monthly  . fluticasone (FLONASE) 50 MCG/ACT nasal spray Place 2 sprays into both nostrils daily.  . hydrochlorothiazide (HYDRODIURIL) 12.5 MG tablet Take 1 tablet (12.5 mg total) by mouth daily.  Marland Kitchen levothyroxine (SYNTHROID) 50 MCG tablet TAKE 1 TABLET BY MOUTH EVERY DAY  . losartan (COZAAR) 50 MG tablet  Take 1 tablet (50 mg total) by mouth daily.  . Multiple Vitamins-Minerals (OCUVITE ADULT 50+ PO) Take 1 tablet by mouth daily.  . simvastatin (ZOCOR) 20 MG tablet TAKE 1 TABLET(20 MG) BY MOUTH DAILY  . vitamin C (ASCORBIC ACID) 500 MG tablet Take 500 mg by mouth daily.    PHQ 2/9 Scores 03/23/2020 03/21/2020 12/15/2019 11/21/2019  PHQ - 2 Score 0 0 0 0  PHQ- 9 Score 0 - 0 0    GAD 7 : Generalized Anxiety Score 03/23/2020 12/15/2019 11/21/2019  Nervous, Anxious, on Edge 0 0 0  Control/stop worrying 0 0 0  Worry too much - different things 0 0 0  Trouble relaxing 0 0 0  Restless 0 0 0  Easily annoyed or irritable 0 0 0  Afraid - awful might happen 0 0 0  Total GAD 7 Score 0 0 0    BP Readings from Last 3 Encounters:  03/23/20 128/72  12/15/19 124/70  11/21/19 122/70    Physical Exam Vitals and nursing note reviewed.  Constitutional:      General: She is not in acute distress.    Appearance: She is not diaphoretic.  HENT:     Head: Normocephalic and atraumatic.     Right Ear: External ear normal.     Left Ear: External ear normal.     Nose: Nose normal.  Eyes:     General:        Right eye: No discharge.        Left eye: No discharge.     Conjunctiva/sclera: Conjunctivae normal.     Pupils: Pupils are equal, round, and reactive to light.  Neck:     Thyroid: No thyromegaly.     Vascular: No JVD.  Cardiovascular:     Rate and Rhythm: Normal rate and regular rhythm.     Heart sounds: Normal heart sounds, S1 normal and S2 normal. No murmur heard.  No systolic murmur is present.  No diastolic murmur is present.  No friction rub. No gallop. No S3 or S4 sounds.   Pulmonary:     Effort: Pulmonary effort is normal.     Breath sounds: Normal breath sounds.  Abdominal:     General: Bowel sounds are normal.     Palpations: Abdomen is soft. There is no mass.     Tenderness: There is no abdominal tenderness. There is no guarding.  Musculoskeletal:        General: Normal range of  motion.     Cervical back: Normal range of motion and neck supple.     Right lower leg: 1+ Pitting Edema present.     Left lower leg: 1+ Pitting Edema present.  Lymphadenopathy:     Cervical: No cervical adenopathy.  Skin:    General: Skin is warm and dry.  Neurological:     Mental Status: She is alert.     Deep Tendon Reflexes: Reflexes are normal and symmetric.  Wt Readings from Last 3 Encounters:  03/23/20 152 lb (68.9 kg)  12/15/19 150 lb (68 kg)  11/21/19 145 lb (65.8 kg)    BP 128/72   Pulse 72   Ht 5\' 2"  (1.575 m)   Wt 152 lb (68.9 kg)   BMI 27.80 kg/m   Assessment and Plan: 1. Non-seasonal allergic rhinitis, unspecified trigger Chronic.  Controlled.  Stable.  Continue fluticasone 2 sprays each nostril once a day. - fluticasone (FLONASE) 50 MCG/ACT nasal spray; Place 2 sprays into both nostrils daily.  Dispense: 16 g; Refill: 11  2. Essential hypertension Chronic.  Controlled.  Stable.  We are maintaining on the lower dose of hydrochlorothiazide 12.5 so as not to underperfused the kidneys.  Patient has had episodic off-and-on coughing which I do not think is secondary to the losartan will continue on the current dosing patient's been unable to take the entire 10 mg of amlodipine at one time due to ankle swelling but has tolerated 5 mg twice a day with minimal swelling as well.  I do think that there is some 1+ edema that we need to address and I think this is most secondary to venous insufficiency as noted below. - hydrochlorothiazide (HYDRODIURIL) 12.5 MG tablet; Take 1 tablet (12.5 mg total) by mouth daily.  Dispense: 90 tablet; Refill: 1 - amLODipine (NORVASC) 5 MG tablet; Take 1 tablet (5 mg total) by mouth 2 (two) times daily.  Dispense: 180 tablet; Refill: 1 - losartan (COZAAR) 50 MG tablet; Take 1 tablet (50 mg total) by mouth daily.  Dispense: 90 tablet; Refill: 1  3. CKD (chronic kidney disease) stage 4, GFR 15-29 ml/min (HCC) Chronic.  Relatively controlled.   Followed with Dr. Candiss Norse.  Patient is doing well on current therapy and he is being monitored by nephrology at this time.  4. Venous insufficiency Patient continues to have bilateral 1+ edema primarily in the afternoons and evenings.  I do not think this is a volume overload concern nor do I think is secondary to the amlodipine at this time to a significant degree.  I do think that there may be some venous insufficiency that I would like to have vein and vascular to evaluate.  If this is such the case that patient may be able to control with compression stockings in the future. - Ambulatory referral to Vascular Surgery

## 2020-04-14 DIAGNOSIS — R69 Illness, unspecified: Secondary | ICD-10-CM | POA: Diagnosis not present

## 2020-04-30 ENCOUNTER — Encounter (INDEPENDENT_AMBULATORY_CARE_PROVIDER_SITE_OTHER): Payer: Self-pay | Admitting: Vascular Surgery

## 2020-04-30 ENCOUNTER — Encounter (INDEPENDENT_AMBULATORY_CARE_PROVIDER_SITE_OTHER): Payer: Self-pay

## 2020-05-08 ENCOUNTER — Other Ambulatory Visit (INDEPENDENT_AMBULATORY_CARE_PROVIDER_SITE_OTHER): Payer: Self-pay | Admitting: Nurse Practitioner

## 2020-05-08 DIAGNOSIS — I872 Venous insufficiency (chronic) (peripheral): Secondary | ICD-10-CM

## 2020-05-10 ENCOUNTER — Other Ambulatory Visit: Payer: Self-pay

## 2020-05-10 ENCOUNTER — Ambulatory Visit (INDEPENDENT_AMBULATORY_CARE_PROVIDER_SITE_OTHER): Payer: Medicare HMO

## 2020-05-10 ENCOUNTER — Encounter (INDEPENDENT_AMBULATORY_CARE_PROVIDER_SITE_OTHER): Payer: Self-pay | Admitting: Vascular Surgery

## 2020-05-10 ENCOUNTER — Ambulatory Visit (INDEPENDENT_AMBULATORY_CARE_PROVIDER_SITE_OTHER): Payer: Medicare HMO | Admitting: Vascular Surgery

## 2020-05-10 VITALS — BP 138/71 | HR 83 | Resp 16 | Ht 62.0 in | Wt 152.0 lb

## 2020-05-10 DIAGNOSIS — E039 Hypothyroidism, unspecified: Secondary | ICD-10-CM

## 2020-05-10 DIAGNOSIS — E782 Mixed hyperlipidemia: Secondary | ICD-10-CM

## 2020-05-10 DIAGNOSIS — I10 Essential (primary) hypertension: Secondary | ICD-10-CM | POA: Diagnosis not present

## 2020-05-10 DIAGNOSIS — I872 Venous insufficiency (chronic) (peripheral): Secondary | ICD-10-CM

## 2020-05-10 DIAGNOSIS — N183 Chronic kidney disease, stage 3 unspecified: Secondary | ICD-10-CM | POA: Diagnosis not present

## 2020-05-11 ENCOUNTER — Encounter (INDEPENDENT_AMBULATORY_CARE_PROVIDER_SITE_OTHER): Payer: Self-pay | Admitting: Vascular Surgery

## 2020-05-11 DIAGNOSIS — I872 Venous insufficiency (chronic) (peripheral): Secondary | ICD-10-CM | POA: Insufficient documentation

## 2020-05-11 NOTE — Progress Notes (Signed)
MRN : [id number]  Carly Marshall is a 84 y.o. ([birth year]) female who presents with chief complaint of  Chief Complaint  Patient presents with  . New Patient (Initial Visit)    ref Ronnald Ramp venous insifficiency  .  History of Present Illness:   Patient is seen for evaluation of leg swelling. The patient first noticed the swelling remotely but is now concerned because of a significant increase in the overall edema. The swelling is associated with pain and discoloration. The patient notes that in the morning the legs are significantly improved but they steadily worsened throughout the course of the day. Elevation makes the legs better, dependency makes them much worse.   There is no history of ulcerations associated with the swelling.   The patient denies any recent changes in their medications.  The patient has not been wearing graduated compression.  The patient has no had any past angiography, interventions or vascular surgery.  The patient denies a history of DVT or PE. There is no prior history of phlebitis. There is no history of primary lymphedema.  There is no history of radiation treatment to the groin or pelvis No history of malignancies. No history of trauma or groin or pelvic surgery. No history of foreign travel or parasitic infections area    Current Meds  Medication Sig  . amLODipine (NORVASC) 5 MG tablet Take 1 tablet (5 mg total) by mouth 2 (two) times daily.  . Calcium Carb-Cholecalciferol (CALCIUM 600 + D PO) Take 1 capsule by mouth 2 (two) times daily.  . clopidogrel (PLAVIX) 75 MG tablet Take 1 tablet (75 mg total) by mouth daily.  . cyanocobalamin (,VITAMIN B-12,) 1000 MCG/ML injection 1 injection monthly  . fluticasone (FLONASE) 50 MCG/ACT nasal spray Place 2 sprays into both nostrils daily.  . hydrochlorothiazide (HYDRODIURIL) 12.5 MG tablet Take 1 tablet (12.5 mg total) by mouth daily.  Marland Kitchen levothyroxine (SYNTHROID) 50 MCG tablet TAKE 1 TABLET BY MOUTH  EVERY DAY  . losartan (COZAAR) 50 MG tablet Take 1 tablet (50 mg total) by mouth daily.  . Multiple Vitamins-Minerals (OCUVITE ADULT 50+ PO) Take 1 tablet by mouth daily.  . simvastatin (ZOCOR) 20 MG tablet TAKE 1 TABLET(20 MG) BY MOUTH DAILY  . vitamin C (ASCORBIC ACID) 500 MG tablet Take 500 mg by mouth daily.    Past Medical History:  Diagnosis Date  . Allergy   . B12 deficiency anemia   . Hyperlipidemia   . Hypertension   . Stroke ([place])   . Thyroid disease     Past Surgical History:  Procedure Laterality Date  . COLONOSCOPY  2012   normal- Dr Sonny Masters  . ECTOPIC PREGNANCY SURGERY      Social History Social History   Tobacco Use  . Smoking status: Never Smoker  . Smokeless tobacco: Never Used  . Tobacco comment: smoking cessation materials not required  Vaping Use  . Vaping Use: Never used  Substance Use Topics  . Alcohol use: No    Alcohol/week: 0.0 standard drinks  . Drug use: No    Family History Family History  Problem Relation Age of Onset  . Diabetes Mother   . Heart disease Mother   . Fibromyalgia Mother   . Heart disease Father   . Hyperlipidemia Father   . Hypertension Father   . Diabetes Sister   . Brain cancer Brother   . Rheum arthritis Sister   No family history of bleeding/clotting disorders, porphyria or autoimmune disease  No Known Allergies   REVIEW OF SYSTEMS (Negative unless checked)  Constitutional: [] Weight loss  [] Fever  [] Chills Cardiac: [] Chest pain   [] Chest pressure   [] Palpitations   [] Shortness of breath when laying flat   [] Shortness of breath with exertion. Vascular:  [] Pain in legs with walking   [] Pain in legs at rest  [] History of DVT   [] Phlebitis   [x] Swelling in legs   [] Varicose veins   [] Non-healing ulcers Pulmonary:   [] Uses home oxygen   [] Productive cough   [] Hemoptysis   [] Wheeze  [] COPD   [] Asthma Neurologic:  [] Dizziness   [] Seizures   [] History of stroke   [] History of TIA  [] Aphasia   [] Vissual changes    [] Weakness or numbness in arm   [] Weakness or numbness in leg Musculoskeletal:   [] Joint swelling   [] Joint pain   [] Low back pain Hematologic:  [] Easy bruising  [] Easy bleeding   [] Hypercoagulable state   [] Anemic Gastrointestinal:  [] Diarrhea   [] Vomiting  [] Gastroesophageal reflux/heartburn   [] Difficulty swallowing. Genitourinary:  [] Chronic kidney disease   [] Difficult urination  [] Frequent urination   [] Blood in urine Skin:  [] Rashes   [] Ulcers  Psychological:  [] History of anxiety   []  History of major depression.  Physical Examination  Vitals:   05/10/20 1349  BP: 138/71  Pulse: 83  Resp: 16  Weight: 152 lb (68.9 kg)  Height: 5\' 2"  (1.575 m)   Body mass index is 27.8 kg/m. Gen: WD/WN, NAD Head: [place]/AT, No temporalis wasting.  Ear/Nose/Throat: Hearing grossly intact, nares w/o erythema or drainage, poor dentition Eyes: PER, EOMI, sclera nonicteric.  Neck: Supple, no masses.  No bruit or JVD.  Pulmonary:  Good air movement, clear to auscultation bilaterally, no use of accessory muscles.  Cardiac: RRR, normal S1, S2, no Murmurs. Vascular: scattered varicosities present bilaterally.  Mild venous stasis changes to the legs bilaterally.  1+ soft pitting edema Vessel Right Left  Radial Palpable Palpable  PT Palpable Palpable  DP Palpable Palpable  Gastrointestinal: soft, non-distended. No guarding/no peritoneal signs.  Musculoskeletal: M/S 5/5 throughout.  No deformity or atrophy.  Neurologic: CN 2-12 intact. Pain and light touch intact in extremities.  Symmetrical.  Speech is fluent. Motor exam as listed above. Psychiatric: Judgment intact, Mood & affect appropriate for pt's clinical situation. Dermatologic: Mild venous rashes or ulcers noted.  No changes consistent with cellulitis.  CBC Lab Results  Component Value Date   WBC 5.7 02/07/2019   HGB 10.7 (L) 02/07/2019   HCT 33.9 (L) 02/07/2019   MCV 94.7 02/07/2019   PLT 196 02/07/2019    BMET    Component Value  Date/Time   NA 137 12/15/2019 1407   K 4.6 12/15/2019 1407   CL 99 12/15/2019 1407   CO2 24 12/15/2019 1407   GLUCOSE 170 (H) 12/15/2019 1407   GLUCOSE 99 02/07/2019 1905   BUN 36 (H) 12/15/2019 1407   CREATININE 1.68 (H) 12/15/2019 1407   CALCIUM 9.5 12/15/2019 1407   GFRNONAA 28 (L) 12/15/2019 1407   GFRAA 33 (L) 12/15/2019 1407   CrCl cannot be calculated (Patient's most recent lab result is older than the maximum 21 days allowed.).  COAG Lab Results  Component Value Date   INR 1.1 02/07/2019    Radiology VAS [place] LOWER EXTREMITY VENOUS REFLUX  Result Date: 05/10/2020  Lower Venous Reflux Study Indications: Swelling.  Performing Technologist: Concha [place] RVT  Examination Guidelines: A complete evaluation includes B-mode imaging, spectral Doppler, color Doppler, and power Doppler  as needed of all accessible portions of each vessel. Bilateral testing is considered an integral part of a complete examination. Limited examinations for reoccurring indications may be performed as noted. The reflux portion of the exam is performed with the patient in reverse Trendelenburg. Significant venous reflux is defined as >500 ms in the superficial venous system, and >1 second in the deep venous system.   Summary: Bilateral: - No evidence of deep vein thrombosis seen in the lower extremities, bilaterally, from the common femoral through the popliteal veins. - No evidence of superficial venous thrombosis in the lower extremities, bilaterally. - No evidence of deep venous insufficiency seen bilaterally in the lower extremity. - No evidence of superficial venous reflux seen in the greater saphenous veins bilaterally. - No evidence of superficial venous reflux seen in the short saphenous veins bilaterally.  *See table(s) above for measurements and observations. Electronically signed by Hortencia Pilar MD on 05/10/2020 at 5:36:48 PM.    Final       Assessment/Plan 1. Chronic venous insufficiency No surgery  or intervention at this point in time.  I have reviewed my discussion with the patient regarding venous insufficiency and why it causes symptoms. I have discussed with the patient the chronic skin changes that accompany venous insufficiency and the long term sequela such as ulceration. Patient will contnue wearing graduated compression stockings on a daily basis, as this has provided excellent control of his edema. The patient will put the stockings on first thing in the morning and removing them in the evening. The patient is reminded not to sleep in the stockings.  In addition, behavioral modification including elevation during the day will be initiated. Exercise is strongly encouraged.  Duplex ultrasound of the bilateral lower extremity shows patent deep system no superficial reflux.  Given the patient's good control and lack of any problems regarding the venous insufficiency and lymphedema a lymph pump in not need at this time.  The patient will follow up with me PRN should anything change.  The patient voices agreement with this plan.  2. Essential hypertension Continue antihypertensive medications as already ordered, these medications have been reviewed and there are no changes at this time.   3. Hypothyroidism, unspecified type Continue hormone replacement as ordered and reviewed, no changes at this time   4. Stage 3 chronic kidney disease, unspecified whether stage 3a or 3b CKD ([place]) At the present time the patient does not need dialysis access.  Continue hemodialysis as ordered without interruption.  Avoid nephrotoxic medications and dehydration.  Further plans per nephrology  5. Mixed hyperlipidemia Continue statin as ordered and reviewed, no changes at this time     Hortencia Pilar, MD  05/11/2020 9:32 AM

## 2020-05-21 DIAGNOSIS — R600 Localized edema: Secondary | ICD-10-CM | POA: Diagnosis not present

## 2020-05-21 DIAGNOSIS — I10 Essential (primary) hypertension: Secondary | ICD-10-CM | POA: Diagnosis not present

## 2020-05-21 DIAGNOSIS — D631 Anemia in chronic kidney disease: Secondary | ICD-10-CM | POA: Diagnosis not present

## 2020-05-21 DIAGNOSIS — N184 Chronic kidney disease, stage 4 (severe): Secondary | ICD-10-CM | POA: Diagnosis not present

## 2020-05-31 ENCOUNTER — Other Ambulatory Visit: Payer: Self-pay | Admitting: Family Medicine

## 2020-05-31 DIAGNOSIS — E538 Deficiency of other specified B group vitamins: Secondary | ICD-10-CM

## 2020-05-31 NOTE — Telephone Encounter (Signed)
Requested medications are due for refill today yes  Requested medications are on the active medication list yes  Last refill 9/26  Last visit 12/2019  Future visit scheduled 06/2020  Notes to clinic This med does not have a protocol listed, please assess.

## 2020-06-09 ENCOUNTER — Other Ambulatory Visit: Payer: Self-pay | Admitting: Family Medicine

## 2020-06-09 DIAGNOSIS — E039 Hypothyroidism, unspecified: Secondary | ICD-10-CM

## 2020-06-09 NOTE — Telephone Encounter (Signed)
Requested Prescriptions  Pending Prescriptions Disp Refills  . levothyroxine (SYNTHROID) 50 MCG tablet [Pharmacy Med Name: LEVOTHYROXINE 50 MCG TABLET] 90 tablet 1    Sig: TAKE 1 TABLET BY MOUTH EVERY DAY     Endocrinology:  Hypothyroid Agents Failed - 06/09/2020  8:46 AM      Failed - TSH needs to be rechecked within 3 months after an abnormal result. Refill until TSH is due.      Passed - TSH in normal range and within 360 days    TSH  Date Value Ref Range Status  12/15/2019 0.986 0.450 - 4.500 uIU/mL Final         Passed - Valid encounter within last 12 months    Recent Outpatient Visits          2 months ago Non-seasonal allergic rhinitis, unspecified trigger   [facility] Juline Patch, MD   5 months ago Essential hypertension   [place], [place]   6 months ago Thoracic disc disorder   [facility] Juline Patch, MD   11 months ago Essential hypertension   [place], Deanna C, MD   1 year ago Essential hypertension   [place], Deanna C, MD      Future Appointments            In 1 week Juline Patch, MD [facility], [facility]

## 2020-06-19 ENCOUNTER — Encounter: Payer: Self-pay | Admitting: Family Medicine

## 2020-06-19 ENCOUNTER — Ambulatory Visit (INDEPENDENT_AMBULATORY_CARE_PROVIDER_SITE_OTHER): Payer: Medicare HMO | Admitting: Family Medicine

## 2020-06-19 ENCOUNTER — Other Ambulatory Visit: Payer: Self-pay | Admitting: Family Medicine

## 2020-06-19 ENCOUNTER — Other Ambulatory Visit: Payer: Self-pay

## 2020-06-19 VITALS — BP 120/76 | HR 68 | Ht 62.0 in | Wt 151.0 lb

## 2020-06-19 DIAGNOSIS — E039 Hypothyroidism, unspecified: Secondary | ICD-10-CM

## 2020-06-19 DIAGNOSIS — I10 Essential (primary) hypertension: Secondary | ICD-10-CM | POA: Diagnosis not present

## 2020-06-19 DIAGNOSIS — I679 Cerebrovascular disease, unspecified: Secondary | ICD-10-CM

## 2020-06-19 DIAGNOSIS — E782 Mixed hyperlipidemia: Secondary | ICD-10-CM

## 2020-06-19 DIAGNOSIS — E538 Deficiency of other specified B group vitamins: Secondary | ICD-10-CM | POA: Diagnosis not present

## 2020-06-19 DIAGNOSIS — R7303 Prediabetes: Secondary | ICD-10-CM

## 2020-06-19 MED ORDER — LEVOTHYROXINE SODIUM 50 MCG PO TABS: 50.0000 ug | ORAL_TABLET | Freq: Every day | ORAL | 1 refills | Status: DC

## 2020-06-19 MED ORDER — SIMVASTATIN 20 MG PO TABS: ORAL_TABLET | ORAL | 1 refills | Status: DC

## 2020-06-19 MED ORDER — CYANOCOBALAMIN 1000 MCG/ML IJ SOLN: INTRAMUSCULAR | 0 refills | Status: DC

## 2020-06-19 MED ORDER — HYDROCHLOROTHIAZIDE 12.5 MG PO TABS: 12.5000 mg | ORAL_TABLET | Freq: Every day | ORAL | 1 refills | Status: DC

## 2020-06-19 MED ORDER — LOSARTAN POTASSIUM 50 MG PO TABS: 50.0000 mg | ORAL_TABLET | Freq: Every day | ORAL | 1 refills | Status: DC

## 2020-06-19 MED ORDER — AMLODIPINE BESYLATE 5 MG PO TABS: 5.0000 mg | ORAL_TABLET | Freq: Two times a day (BID) | ORAL | 1 refills | Status: DC

## 2020-06-19 MED ORDER — CLOPIDOGREL BISULFATE 75 MG PO TABS: 75.0000 mg | ORAL_TABLET | Freq: Every day | ORAL | 1 refills | Status: DC

## 2020-06-19 NOTE — Telephone Encounter (Signed)
Requested medications are due for refill today?  Yes  Requested medications are on active medication list?  Yes  Last Refill:   12/15/2019  # 90 with one refill   Future visit scheduled?  Yes - today   Notes to Clinic:  Medication failed RX refill protocol due to no valid labs in the past 360 days.  Last labs were performed on 02/08/2019.

## 2020-06-19 NOTE — Telephone Encounter (Signed)
Pt is being seen today- will send in RXs after office visit

## 2020-06-19 NOTE — Progress Notes (Signed)
Date:  06/19/2020   Name:  Carly Marshall   DOB:  1935-09-02   MRN:  [id number]   Chief Complaint: Hypothyroidism, Hyperlipidemia, Hypertension, vitamin b12 deficiency, Prediabetes, and cerebral vascular disease (takes plavix)  Hyperlipidemia This is a chronic problem. The problem is controlled. Recent lipid tests were reviewed and are normal. Exacerbating diseases include obesity. She has no history of chronic renal disease, diabetes, hypothyroidism, liver disease or nephrotic syndrome. There are no known factors aggravating her hyperlipidemia. Pertinent negatives include no chest pain, focal sensory loss, focal weakness, leg pain, myalgias or shortness of breath. Current antihyperlipidemic treatment includes statins. The current treatment provides moderate improvement of lipids. There are no compliance problems.  Risk factors for coronary artery disease include hypertension and dyslipidemia.  Hypertension This is a chronic problem. Pertinent negatives include no blurred vision, chest pain, headaches, orthopnea, PND or shortness of breath. Risk factors for coronary artery disease include dyslipidemia. Past treatments include angiotensin blockers, calcium channel blockers and diuretics. The current treatment provides moderate improvement. There is no history of angina, kidney disease, CAD/MI, CVA, heart failure, left ventricular hypertrophy, PVD or retinopathy. Identifiable causes of hypertension include a thyroid problem. There is no history of chronic renal disease, hyperaldosteronism or a hypertension causing med.  Thyroid Problem Presents for follow-up visit. Patient reports no anxiety, cold intolerance, constipation, depressed mood, diaphoresis, diarrhea, fatigue, hair loss, heat intolerance, menstrual problem, nail problem, visual change, weight gain or weight loss. The symptoms have been stable. Her past medical history is significant for hyperlipidemia. There is no history of dementia,  diabetes or heart failure.  Diabetes Her disease course has been stable. Pertinent negatives for hypoglycemia include no dizziness, headaches or nervousness/anxiousness. Pertinent negatives for diabetes include no blurred vision, no chest pain, no fatigue, no foot paresthesias, no polydipsia, no polyphagia, no polyuria, no visual change, no weakness and no weight loss. Symptoms are improving. Pertinent negatives for diabetic complications include no CVA, PVD or retinopathy. She is following a generally healthy diet. Meal planning includes avoidance of concentrated sweets and carbohydrate counting. An ACE inhibitor/angiotensin II receptor blocker is being taken.  Neurologic Problem The patient's pertinent negatives include no altered mental status, clumsiness, focal sensory loss, focal weakness, loss of balance, memory loss, syncope, visual change or weakness. Primary symptoms comment: history tia/expressive aphasia. Chronicity: 2 years ago. The current episode started more than 1 year ago. The problem has been rapidly improving since onset. Pertinent negatives include no abdominal pain, back pain, chest pain, diaphoresis, dizziness, fatigue, fever, headaches, light-headedness, nausea, shortness of breath or vomiting. Treatments tried: plavix. There is no history of a CVA, dementia or liver disease.    Lab Results  Component Value Date   CREATININE 1.68 (H) 12/15/2019   BUN 36 (H) 12/15/2019   NA 137 12/15/2019   K 4.6 12/15/2019   CL 99 12/15/2019   CO2 24 12/15/2019   Lab Results  Component Value Date   CHOL 138 02/08/2019   HDL 53 02/08/2019   LDLCALC 67 02/08/2019   TRIG 91 02/08/2019   CHOLHDL 2.6 02/08/2019   Lab Results  Component Value Date   TSH 0.986 12/15/2019   Lab Results  Component Value Date   HGBA1C 5.8 (H) 02/08/2019   Lab Results  Component Value Date   WBC 5.7 02/07/2019   HGB 10.7 (L) 02/07/2019   HCT 33.9 (L) 02/07/2019   MCV 94.7 02/07/2019   PLT 196  02/07/2019   Lab Results  Component Value  Date   ALT 17 12/15/2019   AST 17 12/15/2019   ALKPHOS 111 12/15/2019   BILITOT 0.5 12/15/2019     Review of Systems  Constitutional: Negative.  Negative for chills, diaphoresis, fatigue, fever, unexpected weight change, weight gain and weight loss.  HENT: Negative for congestion, ear discharge, ear pain, rhinorrhea, sinus pressure, sneezing and sore throat.   Eyes: Negative for blurred vision, photophobia, pain, discharge, redness and itching.  Respiratory: Negative for cough, shortness of breath, wheezing and stridor.   Cardiovascular: Negative for chest pain, orthopnea and PND.  Gastrointestinal: Negative for abdominal pain, blood in stool, constipation, diarrhea, nausea and vomiting.  Endocrine: Negative for cold intolerance, heat intolerance, polydipsia, polyphagia and polyuria.  Genitourinary: Negative for dysuria, flank pain, frequency, hematuria, menstrual problem, pelvic pain, urgency, vaginal bleeding and vaginal discharge.  Musculoskeletal: Negative for arthralgias, back pain and myalgias.  Skin: Negative for rash.  Allergic/Immunologic: Negative for environmental allergies and food allergies.  Neurological: Negative for dizziness, focal weakness, syncope, weakness, light-headedness, numbness, headaches and loss of balance.  Hematological: Negative for adenopathy. Does not bruise/bleed easily.  Psychiatric/Behavioral: Negative for dysphoric mood and memory loss. The patient is not nervous/anxious.     Patient Active Problem List   Diagnosis Date Noted  . Chronic venous insufficiency 05/11/2020  . Aphasia 02/07/2019  . CKD (chronic kidney disease), stage III ([place]) 02/07/2019  . B12 deficiency 04/28/2016  . Essential hypertension 03/27/2015  . Hypothyroidism 03/27/2015  . Hyperlipidemia 03/27/2015  . Allergic rhinitis due to pollen 03/27/2015    No Known Allergies  Past Surgical History:  Procedure Laterality Date  .  COLONOSCOPY  2012   normal- Dr Sonny Masters  . ECTOPIC PREGNANCY SURGERY      Social History   Tobacco Use  . Smoking status: Never Smoker  . Smokeless tobacco: Never Used  . Tobacco comment: smoking cessation materials not required  Vaping Use  . Vaping Use: Never used  Substance Use Topics  . Alcohol use: No    Alcohol/week: 0.0 standard drinks  . Drug use: No     Medication list has been reviewed and updated.  Current Meds  Medication Sig  . amLODipine (NORVASC) 5 MG tablet Take 1 tablet (5 mg total) by mouth 2 (two) times daily.  . Calcium Carb-Cholecalciferol (CALCIUM 600 + D PO) Take 1 capsule by mouth 2 (two) times daily.  . clopidogrel (PLAVIX) 75 MG tablet Take 1 tablet (75 mg total) by mouth daily.  . cyanocobalamin (,VITAMIN B-12,) 1000 MCG/ML injection USE 1 INJECTION MONTHLY  . cyclobenzaprine (FLEXERIL) 5 MG tablet Take 1 tablet (5 mg total) by mouth 3 (three) times daily as needed for muscle spasms.  . fluticasone (FLONASE) 50 MCG/ACT nasal spray Place 2 sprays into both nostrils daily.  . hydrochlorothiazide (HYDRODIURIL) 12.5 MG tablet Take 1 tablet (12.5 mg total) by mouth daily.  Marland Kitchen levothyroxine (SYNTHROID) 50 MCG tablet TAKE 1 TABLET BY MOUTH EVERY DAY  . losartan (COZAAR) 50 MG tablet Take 1 tablet (50 mg total) by mouth daily.  . Multiple Vitamins-Minerals (OCUVITE ADULT 50+ PO) Take 1 tablet by mouth daily.  . simvastatin (ZOCOR) 20 MG tablet TAKE 1 TABLET BY MOUTH EVERY DAY  . torsemide (DEMADEX) 20 MG tablet Take 10 mg by mouth 3 (three) times a week. Dr Candiss Norse  . vitamin C (ASCORBIC ACID) 500 MG tablet Take 500 mg by mouth daily.    PHQ 2/9 Scores 06/19/2020 03/23/2020 03/21/2020 12/15/2019  PHQ - 2 Score 0 0  0 0  PHQ- 9 Score 0 0 - 0    GAD 7 : Generalized Anxiety Score 06/19/2020 03/23/2020 12/15/2019 11/21/2019  Nervous, Anxious, on Edge 0 0 0 0  Control/stop worrying 0 0 0 0  Worry too much - different things 0 0 0 0  Trouble relaxing 0 0 0 0  Restless 0  0 0 0  Easily annoyed or irritable 0 0 0 0  Afraid - awful might happen 0 0 0 0  Total GAD 7 Score 0 0 0 0    BP Readings from Last 3 Encounters:  06/19/20 120/76  05/10/20 138/71  03/23/20 128/72    Physical Exam Vitals and nursing note reviewed.  Constitutional:      Appearance: She is well-developed.  HENT:     Head: Normocephalic.     Right Ear: Tympanic membrane, ear canal and external ear normal.     Left Ear: Tympanic membrane and external ear normal.     Nose: Nose normal.  Eyes:     General: Lids are everted, no foreign bodies appreciated. No scleral icterus.       Left eye: No foreign body or hordeolum.     Conjunctiva/sclera: Conjunctivae normal.     Right eye: Right conjunctiva is not injected.     Left eye: Left conjunctiva is not injected.     Pupils: Pupils are equal, round, and reactive to light.  Neck:     Thyroid: No thyromegaly.     Vascular: No JVD.     Trachea: No tracheal deviation.  Cardiovascular:     Rate and Rhythm: Normal rate and regular rhythm.     Heart sounds: Normal heart sounds. No murmur heard.  No friction rub. No gallop.   Pulmonary:     Effort: Pulmonary effort is normal. No respiratory distress.     Breath sounds: Normal breath sounds. No wheezing, rhonchi or rales.  Abdominal:     General: Bowel sounds are normal.     Palpations: Abdomen is soft. There is no mass.     Tenderness: There is no abdominal tenderness. There is no guarding or rebound.  Musculoskeletal:        General: No tenderness. Normal range of motion.     Cervical back: Normal range of motion and neck supple.  Lymphadenopathy:     Cervical: No cervical adenopathy.  Skin:    General: Skin is warm.     Findings: No rash.  Neurological:     Mental Status: She is alert and oriented to person, place, and time.     Cranial Nerves: No cranial nerve deficit.     Deep Tendon Reflexes: Reflexes normal.  Psychiatric:        Mood and Affect: Mood is not anxious or  depressed.     Wt Readings from Last 3 Encounters:  06/19/20 151 lb (68.5 kg)  05/10/20 152 lb (68.9 kg)  03/23/20 152 lb (68.9 kg)    BP 120/76   Pulse 68   Ht 5\' 2"  (1.575 m)   Wt 151 lb (68.5 kg)   BMI 27.62 kg/m   Assessment and Plan: 1. Essential hypertension Chronic.  Controlled.  Stable.  Blood pressure is 120/76 today.  We will continue amlodipine 5 mg 1 tablet twice a day, hydrochlorothiazide 12.5 mg once a day, and losartan 50 mg once a day.  Will check CMP for electrolytes and GFR. - amLODipine (NORVASC) 5 MG tablet; Take 1 tablet (5 mg total) by  mouth 2 (two) times daily.  Dispense: 180 tablet; Refill: 1 - hydrochlorothiazide (HYDRODIURIL) 12.5 MG tablet; Take 1 tablet (12.5 mg total) by mouth daily.  Dispense: 90 tablet; Refill: 1 - losartan (COZAAR) 50 MG tablet; Take 1 tablet (50 mg total) by mouth daily.  Dispense: 90 tablet; Refill: 1 - Comprehensive Metabolic Panel (CMET)  2. Cerebral vascular disease Chronic.  Controlled.  Stable.  Status post 2 years ago of TIA with expressive aphasia with no recurrence.  Will continue Plavix 75 mg once a day. - clopidogrel (PLAVIX) 75 MG tablet; Take 1 tablet (75 mg total) by mouth daily.  Dispense: 90 tablet; Refill: 1  3. B12 deficiency Chronic.  Controlled.  Stable.  We will continue with injections and check CBC for anemia. - cyanocobalamin (,VITAMIN B-12,) 1000 MCG/ML injection; USE 1 INJECTION MONTHLY  Dispense: 1 mL; Refill: 0 - CBC w/Diff/Platelet  4. Hypothyroidism, unspecified type Chronic.  Controlled.  Stable.  Will check TSH with panel and continue levothyroxine 50 mcg daily. - levothyroxine (SYNTHROID) 50 MCG tablet; Take 1 tablet (50 mcg total) by mouth daily.  Dispense: 90 tablet; Refill: 1 - Thyroid Panel With TSH  5. Mixed hyperlipidemia Chronic.  Controlled.  Stable.  Continue simvastatin 20 mg once a day.  Will check lipid panel for current status. - simvastatin (ZOCOR) 20 MG tablet; TAKE 1 TABLET BY  MOUTH EVERY DAY  Dispense: 90 tablet; Refill: 1 - Lipid Panel With LDL/HDL Ratio  6. Prediabetes Chronic.  Controlled.  Stable.  Will check A1c to see what current status by diet. - HgB A1c

## 2020-06-20 LAB — CBC WITH DIFFERENTIAL/PLATELET
Basophils Absolute: 0 10*3/uL (ref 0.0–0.2)
Basos: 1 %
EOS (ABSOLUTE): 0.2 10*3/uL (ref 0.0–0.4)
Eos: 3 %
Hematocrit: 35.2 % (ref 34.0–46.6)
Hemoglobin: 11.5 g/dL (ref 11.1–15.9)
Immature Grans (Abs): 0 10*3/uL (ref 0.0–0.1)
Immature Granulocytes: 0 %
Lymphocytes Absolute: 1.8 10*3/uL (ref 0.7–3.1)
Lymphs: 22 %
MCH: 29.5 pg (ref 26.6–33.0)
MCHC: 32.7 g/dL (ref 31.5–35.7)
MCV: 90 fL (ref 79–97)
Monocytes Absolute: 0.6 10*3/uL (ref 0.1–0.9)
Monocytes: 8 %
Neutrophils Absolute: 5.3 10*3/uL (ref 1.4–7.0)
Neutrophils: 66 %
Platelets: 283 10*3/uL (ref 150–450)
RBC: 3.9 x10E6/uL (ref 3.77–5.28)
RDW: 13 % (ref 11.7–15.4)
WBC: 7.9 10*3/uL (ref 3.4–10.8)

## 2020-06-20 LAB — COMPREHENSIVE METABOLIC PANEL
ALT: 13 IU/L (ref 0–32)
AST: 18 IU/L (ref 0–40)
Albumin/Globulin Ratio: 2 (ref 1.2–2.2)
Albumin: 4.7 g/dL — ABNORMAL HIGH (ref 3.6–4.6)
Alkaline Phosphatase: 101 IU/L (ref 44–121)
BUN/Creatinine Ratio: 19 (ref 12–28)
BUN: 37 mg/dL — ABNORMAL HIGH (ref 8–27)
Bilirubin Total: 0.5 mg/dL (ref 0.0–1.2)
CO2: 24 mmol/L (ref 20–29)
Calcium: 10.1 mg/dL (ref 8.7–10.3)
Chloride: 101 mmol/L (ref 96–106)
Creatinine, Ser: 1.93 mg/dL — ABNORMAL HIGH (ref 0.57–1.00)
GFR calc Af Amer: 27 mL/min/{1.73_m2} — ABNORMAL LOW (ref >59)
GFR calc non Af Amer: 24 mL/min/{1.73_m2} — ABNORMAL LOW (ref >59)
Globulin, Total: 2.3 g/dL (ref 1.5–4.5)
Glucose: 88 mg/dL (ref 65–99)
Potassium: 3.8 mmol/L (ref 3.5–5.2)
Sodium: 143 mmol/L (ref 134–144)
Total Protein: 7 g/dL (ref 6.0–8.5)

## 2020-06-20 LAB — HEMOGLOBIN A1C
Est. average glucose Bld gHb Est-mCnc: 128 mg/dL
Hgb A1c MFr Bld: 6.1 % — ABNORMAL HIGH (ref 4.8–5.6)

## 2020-06-20 LAB — THYROID PANEL WITH TSH
Free Thyroxine Index: 1.9 (ref 1.2–4.9)
T3 Uptake Ratio: 27 % (ref 24–39)
T4, Total: 7 ug/dL (ref 4.5–12.0)
TSH: 1.87 u[IU]/mL (ref 0.450–4.500)

## 2020-06-20 LAB — LIPID PANEL WITH LDL/HDL RATIO
Cholesterol, Total: 138 mg/dL (ref 100–199)
HDL: 63 mg/dL (ref >39)
LDL Chol Calc (NIH): 59 mg/dL (ref 0–99)
LDL/HDL Ratio: 0.9 ratio (ref 0.0–3.2)
Triglycerides: 84 mg/dL (ref 0–149)
VLDL Cholesterol Cal: 16 mg/dL (ref 5–40)

## 2020-06-27 DIAGNOSIS — H40023 Open angle with borderline findings, high risk, bilateral: Secondary | ICD-10-CM | POA: Diagnosis not present

## 2020-07-10 ENCOUNTER — Ambulatory Visit: Payer: Self-pay | Admitting: *Deleted

## 2020-07-10 NOTE — Telephone Encounter (Signed)
  Pt called in concerned that she had been exposed to someone last Sunday afternoon that just found out she is positive for COVID-19.   "She was coughing a lot".  Pt is fully vaccinated.   No booster shot yet.  No symptoms.  I went over all the protocols and information with her.   I answered her questions.   She\'s going to be tested between days 5-7 post exposure at the [facility] near her house.  She thanked me very much for my help.   Reason for Disposition . [1] CLOSE CONTACT COVID-19 EXPOSURE within last 14 days AND [2] NO symptoms  Answer Assessment - Initial Assessment Questions 1. COVID-19 EXPOSURE: "Please describe how you were exposed to someone with a COVID-19 infection."     My friend is coughing bad.  I was with her Sunday and now she has COVID.  I\'ve had both vaccines.   2. PLACE of CONTACT: "Where were you when you were exposed to COVID-19?" (e.g., home, school, medical waiting room; which city?)     Her house Sunday afternoon for an hour. 3. TYPE of CONTACT: "How much contact was there?" (e.g., sitting next to, live in same house, work in same office, same building)     I was in her house last Sunday afternoon for an hour. 4. DURATION of CONTACT: "How long were you in contact with the COVID-19 patient?" (e.g., a few seconds, passed by person, a few minutes, 15 minutes or longer, live with the patient)     An hour 5. MASK: "Were you wearing a mask?" "Was the other person wearing a mask?" Note: wearing a mask reduces the risk of an otherwise close contact.     No masks 6. DATE of CONTACT: "When did you have contact with a COVID-19 patient?" (e.g., how many days ago)     11 /28/2021 7. COMMUNITY SPREAD: "Are there lots of cases of COVID-19 (community spread) where you live?" (See public health department website, if unsure)       Yes 8. SYMPTOMS: "Do you have any symptoms?" (e.g., fever, cough, breathing difficulty, loss of taste or smell)     No symptoms 9. PREGNANCY OR POSTPARTUM:  "Is there any chance you are pregnant?" "When was your last menstrual period?" "Did you deliver in the last 2 weeks?"     N/A due to age 84. HIGH RISK: "Do you have any heart or lung problems?" "Do you have a weak immune system?" (e.g., heart failure, COPD, asthma, HIV positive, chemotherapy, renal failure, diabetes mellitus, sickle cell anemia, obesity)       Not asked 11. TRAVEL: "Have you traveled out of the country recently?" If Yes, ask: "When and where?" Also ask about out-of-state travel, since the CDC has identified some high-risk cities for community spread in the [place]. Note: Travel becomes less relevant if there is widespread community transmission where the patient lives.       No  Protocols used: CORONAVIRUS (COVID-19) EXPOSURE-A-AH

## 2020-07-11 DIAGNOSIS — Z20822 Contact with and (suspected) exposure to covid-19: Secondary | ICD-10-CM | POA: Diagnosis not present

## 2020-07-11 DIAGNOSIS — Z03818 Encounter for observation for suspected exposure to other biological agents ruled out: Secondary | ICD-10-CM | POA: Diagnosis not present

## 2020-07-18 DIAGNOSIS — D631 Anemia in chronic kidney disease: Secondary | ICD-10-CM | POA: Diagnosis not present

## 2020-07-18 DIAGNOSIS — N184 Chronic kidney disease, stage 4 (severe): Secondary | ICD-10-CM | POA: Diagnosis not present

## 2020-07-18 DIAGNOSIS — I10 Essential (primary) hypertension: Secondary | ICD-10-CM | POA: Diagnosis not present

## 2020-07-22 ENCOUNTER — Other Ambulatory Visit: Payer: Self-pay | Admitting: Family Medicine

## 2020-07-22 DIAGNOSIS — E538 Deficiency of other specified B group vitamins: Secondary | ICD-10-CM

## 2020-07-22 NOTE — Telephone Encounter (Signed)
Requested medication (s) are due for refill today: yes  Requested medication (s) are on the active medication list: yes  Last refill:  06/19/20  Future visit scheduled: yes  Notes to clinic:  med not assigned to a protocol   Requested Prescriptions  Pending Prescriptions Disp Refills   cyanocobalamin (,VITAMIN B-12,) 1000 MCG/ML injection [Pharmacy Med Name: CYANOCOBALAMIN 1,000 MCG/ML VL] 1 mL 0    Sig: INJECT 1 ML INTRAMUSCULARLY ONCE A MONTH      Off-Protocol Failed - 07/22/2020  9:20 AM      Failed - Medication not assigned to a protocol, review manually.      Passed - Valid encounter within last 12 months    Recent Outpatient Visits           1 month ago Prediabetes   [facility] Juline Patch, MD   4 months ago Non-seasonal allergic rhinitis, unspecified trigger   [facility] Juline Patch, MD   7 months ago Essential hypertension   [place], MD   8 months ago Thoracic disc disorder   [facility] Juline Patch, MD   1 year ago Essential hypertension   [place], MD       Future Appointments             In 4 months Juline Patch, MD [facility]            Off-Protocol Failed - 07/22/2020  9:20 AM      Failed - Medication not assigned to a protocol, review manually.      Passed - Valid encounter within last 12 months    Recent Outpatient Visits           1 month ago Prediabetes   [facility] Juline Patch, MD   4 months ago Non-seasonal allergic rhinitis, unspecified trigger   [facility] Juline Patch, MD   7 months ago Essential hypertension   [place], MD   8 months ago Thoracic disc disorder   [facility] Juline Patch, MD   1 year ago Essential hypertension   [place], Deanna C, MD       Future Appointments             In 4 months Juline Patch,  MD [facility], [facility]

## 2020-08-29 DIAGNOSIS — Z85820 Personal history of malignant melanoma of skin: Secondary | ICD-10-CM | POA: Diagnosis not present

## 2020-08-29 DIAGNOSIS — Z8673 Personal history of transient ischemic attack (TIA), and cerebral infarction without residual deficits: Secondary | ICD-10-CM | POA: Diagnosis not present

## 2020-08-29 DIAGNOSIS — M2550 Pain in unspecified joint: Secondary | ICD-10-CM | POA: Diagnosis not present

## 2020-08-29 DIAGNOSIS — I10 Essential (primary) hypertension: Secondary | ICD-10-CM | POA: Diagnosis not present

## 2020-08-29 DIAGNOSIS — E039 Hypothyroidism, unspecified: Secondary | ICD-10-CM | POA: Diagnosis not present

## 2020-08-29 DIAGNOSIS — Z7902 Long term (current) use of antithrombotics/antiplatelets: Secondary | ICD-10-CM | POA: Diagnosis not present

## 2020-08-29 DIAGNOSIS — R32 Unspecified urinary incontinence: Secondary | ICD-10-CM | POA: Diagnosis not present

## 2020-08-29 DIAGNOSIS — E785 Hyperlipidemia, unspecified: Secondary | ICD-10-CM | POA: Diagnosis not present

## 2020-08-30 ENCOUNTER — Other Ambulatory Visit (HOSPITAL_COMMUNITY): Payer: Self-pay | Admitting: Nephrology

## 2020-08-30 ENCOUNTER — Other Ambulatory Visit: Payer: Self-pay | Admitting: Nephrology

## 2020-08-30 DIAGNOSIS — N189 Chronic kidney disease, unspecified: Secondary | ICD-10-CM | POA: Insufficient documentation

## 2020-08-30 DIAGNOSIS — R600 Localized edema: Secondary | ICD-10-CM | POA: Insufficient documentation

## 2020-08-30 DIAGNOSIS — N184 Chronic kidney disease, stage 4 (severe): Secondary | ICD-10-CM | POA: Diagnosis not present

## 2020-08-30 DIAGNOSIS — D631 Anemia in chronic kidney disease: Secondary | ICD-10-CM | POA: Diagnosis not present

## 2020-08-30 DIAGNOSIS — I10 Essential (primary) hypertension: Secondary | ICD-10-CM | POA: Diagnosis not present

## 2020-09-06 ENCOUNTER — Other Ambulatory Visit: Payer: Self-pay

## 2020-09-06 ENCOUNTER — Ambulatory Visit
Admission: RE | Admit: 2020-09-06 | Discharge: 2020-09-06 | Disposition: A | Discharge status: Home / Self Care | Payer: Medicare HMO | Source: Ambulatory Visit | Attending: Nephrology | Admitting: Nephrology

## 2020-09-06 DIAGNOSIS — N184 Chronic kidney disease, stage 4 (severe): Secondary | ICD-10-CM

## 2020-09-06 DIAGNOSIS — N1330 Unspecified hydronephrosis: Secondary | ICD-10-CM | POA: Diagnosis not present

## 2020-09-21 ENCOUNTER — Other Ambulatory Visit (HOSPITAL_COMMUNITY): Payer: Self-pay | Admitting: Nephrology

## 2020-09-21 ENCOUNTER — Other Ambulatory Visit: Payer: Self-pay | Admitting: Nephrology

## 2020-09-21 DIAGNOSIS — N1339 Other hydronephrosis: Secondary | ICD-10-CM

## 2020-09-25 ENCOUNTER — Ambulatory Visit
Admission: RE | Admit: 2020-09-25 | Discharge: 2020-09-25 | Disposition: A | Discharge status: Home / Self Care | Payer: Medicare HMO | Source: Ambulatory Visit | Attending: Nephrology | Admitting: Nephrology

## 2020-09-25 ENCOUNTER — Other Ambulatory Visit: Payer: Self-pay

## 2020-09-25 DIAGNOSIS — N1330 Unspecified hydronephrosis: Secondary | ICD-10-CM | POA: Diagnosis not present

## 2020-09-25 DIAGNOSIS — N1339 Other hydronephrosis: Secondary | ICD-10-CM | POA: Diagnosis not present

## 2020-09-25 DIAGNOSIS — N281 Cyst of kidney, acquired: Secondary | ICD-10-CM | POA: Diagnosis not present

## 2020-09-25 DIAGNOSIS — K3189 Other diseases of stomach and duodenum: Secondary | ICD-10-CM | POA: Diagnosis not present

## 2020-09-25 DIAGNOSIS — I700 Atherosclerosis of aorta: Secondary | ICD-10-CM | POA: Diagnosis not present

## 2020-09-26 ENCOUNTER — Other Ambulatory Visit: Payer: Self-pay

## 2020-09-26 ENCOUNTER — Other Ambulatory Visit: Payer: Self-pay | Admitting: Family Medicine

## 2020-09-26 DIAGNOSIS — E538 Deficiency of other specified B group vitamins: Secondary | ICD-10-CM

## 2020-09-26 MED ORDER — CYANOCOBALAMIN 1000 MCG/ML IJ SOLN: INTRAMUSCULAR | 1 refills | Status: DC

## 2020-09-28 ENCOUNTER — Telehealth: Payer: Self-pay

## 2020-09-28 NOTE — Telephone Encounter (Unsigned)
Copied from [place] [id number]. Topic: General - Other >> Sep 28, 2020  2:18 PM Pawlus, Brayton Layman A wrote: Reason for CRM: PT wanted to speak with Baxter Flattery and requested she call her back. PT did not give details regarding what she wanted to discuss.

## 2020-09-28 NOTE — Telephone Encounter (Signed)
Called pt back.

## 2020-10-10 ENCOUNTER — Encounter: Payer: Self-pay | Admitting: Urology

## 2020-10-10 ENCOUNTER — Ambulatory Visit: Payer: Medicare HMO | Admitting: Urology

## 2020-10-10 ENCOUNTER — Other Ambulatory Visit: Payer: Self-pay

## 2020-10-10 VITALS — BP 117/57 | HR 91 | Ht 62.0 in | Wt 149.0 lb

## 2020-10-10 DIAGNOSIS — R93422 Abnormal radiologic findings on diagnostic imaging of left kidney: Secondary | ICD-10-CM

## 2020-10-10 NOTE — Progress Notes (Signed)
10/10/2020 2:20 PM   Carly Marshall [birth year] [id number]  Referring provider: Murlean Iba, MD [place],  [place] [address]  Chief Complaint  Patient presents with  . Other    HPI:   Refer to Dr. Candiss Norse for stage IV chronic kidney disease with risk factors including hypertension, atherosclerosis and aging  Screening renal ultrasound performed 09/06/2020 showed severe cortical thinning of the left kidney felt to represent severe left hydronephrosis versus cyst  Noncontrast CT 09/25/2020 showed a normal-appearing right kidney and prominent cystic change of the left kidney and minimal parenchyma  She denies flank or abdominal pain  No prior imaging available for comparison  Does not remember any previous imaging where she was told she had an abnormal kidney  Denies recurrent UTI or gross hematuria  PMH: Past Medical History:  Diagnosis Date  . Allergy   . B12 deficiency anemia   . Hyperlipidemia   . Hypertension   . Stroke ([place])   . Thyroid disease     Surgical History: Past Surgical History:  Procedure Laterality Date  . COLONOSCOPY  2012   normal- Dr Sonny Masters  . ECTOPIC PREGNANCY SURGERY      Home Medications:  Allergies as of 10/10/2020   No Known Allergies     Medication List       Accurate as of October 10, 2020  2:20 PM. If you have any questions, ask your nurse or doctor.        amLODipine 5 MG tablet Commonly known as: NORVASC Take 1 tablet (5 mg total) by mouth 2 (two) times daily.   CALCIUM 600 + D PO Take 1 capsule by mouth 2 (two) times daily.   clopidogrel 75 MG tablet Commonly known as: PLAVIX Take 1 tablet (75 mg total) by mouth daily.   cyanocobalamin 1000 MCG/ML injection Commonly known as: (VITAMIN B-12) INJECT 1ML INTRAMUSCULARLY ONCE A MONTH   cyclobenzaprine 5 MG tablet Commonly known as: FLEXERIL Take 1 tablet (5 mg total) by mouth 3 (three) times daily as needed for muscle spasms.    fluticasone 50 MCG/ACT nasal spray Commonly known as: FLONASE Place 2 sprays into both nostrils daily.   hydrochlorothiazide 12.5 MG tablet Commonly known as: HYDRODIURIL Take 1 tablet (12.5 mg total) by mouth daily.   levothyroxine 50 MCG tablet Commonly known as: SYNTHROID Take 1 tablet (50 mcg total) by mouth daily.   losartan 50 MG tablet Commonly known as: COZAAR Take 1 tablet (50 mg total) by mouth daily.   OCUVITE ADULT 50+ PO Take 1 tablet by mouth daily.   simvastatin 20 MG tablet Commonly known as: ZOCOR TAKE 1 TABLET BY MOUTH EVERY DAY   torsemide 20 MG tablet Commonly known as: DEMADEX Take 10 mg by mouth 3 (three) times a week. Dr Candiss Norse   vitamin C 500 MG tablet Commonly known as: ASCORBIC ACID Take 500 mg by mouth daily.       Allergies: No Known Allergies  Family History: Family History  Problem Relation Age of Onset  . Diabetes Mother   . Heart disease Mother   . Fibromyalgia Mother   . Heart disease Father   . Hyperlipidemia Father   . Hypertension Father   . Diabetes Sister   . Brain cancer Brother   . Rheum arthritis Sister     Social History:  reports that she has never smoked. She has never used smokeless tobacco. She reports that she does not drink alcohol and does not  use drugs.   Physical Exam: BP (!) 117/57   Pulse 91   Ht 5\' 2"  (1.575 m)   Wt 149 lb (67.6 kg)   BMI 27.25 kg/m   Constitutional:  Alert and oriented, No acute distress. HEENT: [place] AT, moist mucus membranes.  Trachea midline, no masses. Cardiovascular: No clubbing, cyanosis, or edema. Respiratory: Normal respiratory effort, no increased work of breathing. GI: Abdomen is soft, nontender, nondistended, no abdominal masses GU: No CVA tenderness Neurologic: Grossly intact, no focal deficits, moving all 4 extremities. Psychiatric: Normal mood and affect.   Pertinent Imaging: CT images were personally reviewed and interpreted  Assessment & Plan:    1. 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 The possibility of congenital cystic kidney disease was also discussed  Minimal rim of parenchyma and this most likely represents a nonfunctioning kidney. Do not think a renal scan would provide valuable information based on CT appearance  No abnormalities of the right kidney noted    [name], MD  [facility] [address], [place], [place] [address] [phone]

## 2020-11-05 ENCOUNTER — Ambulatory Visit: Payer: Self-pay

## 2020-11-05 NOTE — Telephone Encounter (Signed)
Noted and agreed- go to ER or urgent care

## 2020-11-05 NOTE — Telephone Encounter (Signed)
This am approximately 0750, pt was holding a baby and tripped over toys on the floor. Pt feel on right side and was able to put baby in crib. Baby is safe.  Pt sustained injuries to her right side and toward the crown of her head and bruising noted, right elbow with smal cut and hurts with ROM and pain is referred to her right shoulder. Left ankle is sore and pt can bear her weight on the ankle. Right elbow sustained " tiny cuts."  Pt has headache and an ice pack was applied.  Care advice given and  Pt referred to [facility] [place] does not see WC cases.  Pt verbalized understanding.      Reason for Disposition . [1] Age over 59 years AND [2] swelling or bruise  Answer Assessment - Initial Assessment Questions 1. MECHANISM: "How did the injury happen?" For falls, ask: "What height did you fall from?" and "What surface did you fall against?"      Tripped on a toy hit the right side to head  Fell against crib 2. ONSET: "When did the injury happen?" (Minutes or hours ago)      0750 3. NEUROLOGIC SYMPTOMS: "Was there any loss of consciousness?" "Are there any other neurological symptoms?"     No- headache 4. MENTAL STATUS: "Does the person know who he is, who you are, and where he is?"      yes 5. LOCATION: "What part of the head was hit?"      Right side toward the crown 6. SCALP APPEARANCE: "What does the scalp look like? Is it bleeding now?" If Yes, ask: "Is it difficult to stop?"      No bleeding 7. SIZE: For cuts, bruises, or swelling, ask: "How large is it?" (e.g., inches or centimeters)     bruise 8. PAIN: "Is there any pain?" If Yes, ask: "How bad is it?"  (e.g., Scale 1-10; or mild, moderate, severe)     mild 9. TETANUS: For any breaks in the skin, ask: "When was the last tetanus booster?"     n/a 10. OTHER SYMPTOMS: "Do you have any other symptoms?" (e.g., neck pain, vomiting)       Left ankle can bear weight but favors it, right elbow- tiny cut and hurts with ROM and referred to right  shoulder 11. PREGNANCY: "Is there any chance you are pregnant?" "When was your last menstrual period?"     n/a  Protocols used: HEAD INJURY-A-AH

## 2020-11-20 ENCOUNTER — Other Ambulatory Visit: Payer: Self-pay | Admitting: Family Medicine

## 2020-11-20 DIAGNOSIS — E538 Deficiency of other specified B group vitamins: Secondary | ICD-10-CM

## 2020-12-03 DIAGNOSIS — H52223 Regular astigmatism, bilateral: Secondary | ICD-10-CM | POA: Diagnosis not present

## 2020-12-03 DIAGNOSIS — H40013 Open angle with borderline findings, low risk, bilateral: Secondary | ICD-10-CM | POA: Diagnosis not present

## 2020-12-10 ENCOUNTER — Ambulatory Visit (INDEPENDENT_AMBULATORY_CARE_PROVIDER_SITE_OTHER): Payer: Medicare HMO | Admitting: Family Medicine

## 2020-12-10 ENCOUNTER — Encounter: Payer: Self-pay | Admitting: Family Medicine

## 2020-12-10 ENCOUNTER — Other Ambulatory Visit: Payer: Self-pay

## 2020-12-10 VITALS — BP 112/62 | HR 60 | Ht 62.0 in | Wt 147.0 lb

## 2020-12-10 DIAGNOSIS — E782 Mixed hyperlipidemia: Secondary | ICD-10-CM

## 2020-12-10 DIAGNOSIS — I10 Essential (primary) hypertension: Secondary | ICD-10-CM | POA: Diagnosis not present

## 2020-12-10 DIAGNOSIS — J3089 Other allergic rhinitis: Secondary | ICD-10-CM

## 2020-12-10 DIAGNOSIS — R7303 Prediabetes: Secondary | ICD-10-CM

## 2020-12-10 DIAGNOSIS — E039 Hypothyroidism, unspecified: Secondary | ICD-10-CM | POA: Diagnosis not present

## 2020-12-10 DIAGNOSIS — I679 Cerebrovascular disease, unspecified: Secondary | ICD-10-CM

## 2020-12-10 MED ORDER — HYDROCHLOROTHIAZIDE 12.5 MG PO TABS
12.5000 mg | ORAL_TABLET | Freq: Every day | ORAL | 1 refills | Status: DC
Start: 2020-12-10 — End: 2021-06-18

## 2020-12-10 MED ORDER — FLUTICASONE PROPIONATE 50 MCG/ACT NA SUSP: 2.0000 | Freq: Every day | NASAL | 5 refills | Status: DC

## 2020-12-10 MED ORDER — SIMVASTATIN 20 MG PO TABS: ORAL_TABLET | ORAL | 1 refills | Status: DC

## 2020-12-10 MED ORDER — LEVOTHYROXINE SODIUM 50 MCG PO TABS: 50.0000 ug | ORAL_TABLET | Freq: Every day | ORAL | 1 refills | Status: DC

## 2020-12-10 MED ORDER — CLOPIDOGREL BISULFATE 75 MG PO TABS: 75.0000 mg | ORAL_TABLET | Freq: Every day | ORAL | 1 refills | Status: DC

## 2020-12-10 MED ORDER — LOSARTAN POTASSIUM 50 MG PO TABS: 50.0000 mg | ORAL_TABLET | Freq: Every day | ORAL | 1 refills | Status: DC

## 2020-12-10 MED ORDER — AMLODIPINE BESYLATE 5 MG PO TABS: 5.0000 mg | ORAL_TABLET | Freq: Two times a day (BID) | ORAL | 1 refills | Status: DC

## 2020-12-10 NOTE — Progress Notes (Signed)
Date:  12/10/2020   Name:  Carly Marshall   DOB:  1936/02/09   MRN:  [id number]   Chief Complaint: Hypertension, Hyperlipidemia, Hypothyroidism, Allergic Rhinitis , b12 deficiency, Prediabetes, and Cerebrovascular Accident (Takes plavix)  Hypertension This is a chronic problem. The current episode started more than 1 year ago. The problem has been gradually improving since onset. The problem is controlled. Associated symptoms include shortness of breath. Pertinent negatives include no anxiety, blurred vision, chest pain, headaches, malaise/fatigue, neck pain, orthopnea, palpitations, peripheral edema, PND or sweats. There are no associated agents to hypertension. There are no known risk factors for coronary artery disease. Past treatments include calcium channel blockers and angiotensin blockers. The current treatment provides moderate improvement. There are no compliance problems.  There is no history of angina, kidney disease, CAD/MI, CVA, heart failure, left ventricular hypertrophy, PVD or retinopathy. Identifiable causes of hypertension include a thyroid problem. There is no history of chronic renal disease, a hypertension causing med or renovascular disease.  Hyperlipidemia This is a chronic problem. The current episode started more than 1 year ago. Recent lipid tests were reviewed and are normal. She has no history of chronic renal disease, diabetes, hypothyroidism, liver disease, obesity or nephrotic syndrome. Factors aggravating her hyperlipidemia include thiazides. Associated symptoms include shortness of breath. Pertinent negatives include no chest pain, focal sensory loss, focal weakness, leg pain or myalgias. Current antihyperlipidemic treatment includes statins. The current treatment provides mild improvement of lipids. There are no compliance problems.   Cerebrovascular Accident This is a chronic (for cerebrovascular disease) problem. The current episode started more than 1 year ago.  The problem has been gradually improving. Pertinent negatives include no abdominal pain, anorexia, arthralgias, change in bowel habit, chest pain, chills, congestion, coughing, diaphoresis, fatigue, fever, headaches, joint swelling, myalgias, nausea, neck pain, numbness, rash, sore throat, swollen glands, urinary symptoms, vertigo, visual change, vomiting or weakness. Nothing aggravates the symptoms. The treatment provided moderate relief.  Thyroid Problem Presents for follow-up visit. Patient reports no anxiety, cold intolerance, constipation, diaphoresis, diarrhea, fatigue, hair loss, heat intolerance, hoarse voice, leg swelling, menstrual problem, palpitations, visual change or weight loss. The symptoms have been stable. Her past medical history is significant for hyperlipidemia. There is no history of diabetes or heart failure.  Anemia Presents for follow-up visit. There has been no abdominal pain, anorexia, bruising/bleeding easily, fever, light-headedness, malaise/fatigue, palpitations or weight loss. Signs of blood loss that are not present include hematemesis, hematochezia, melena, menorrhagia and vaginal bleeding. There is no history of chronic renal disease, heart failure or hypothyroidism.    Lab Results  Component Value Date   CREATININE 1.93 (H) 06/19/2020   BUN 37 (H) 06/19/2020   NA 143 06/19/2020   K 3.8 06/19/2020   CL 101 06/19/2020   CO2 24 06/19/2020   Lab Results  Component Value Date   CHOL 138 06/19/2020   HDL 63 06/19/2020   LDLCALC 59 06/19/2020   TRIG 84 06/19/2020   CHOLHDL 2.6 02/08/2019   Lab Results  Component Value Date   TSH 1.870 06/19/2020   Lab Results  Component Value Date   HGBA1C 6.1 (H) 06/19/2020   Lab Results  Component Value Date   WBC 7.9 06/19/2020   HGB 11.5 06/19/2020   HCT 35.2 06/19/2020   MCV 90 06/19/2020   PLT 283 06/19/2020   Lab Results  Component Value Date   ALT 13 06/19/2020   AST 18 06/19/2020   ALKPHOS 101  06/19/2020  BILITOT 0.5 06/19/2020     Review of Systems  Constitutional: Negative.  Negative for chills, diaphoresis, fatigue, fever, malaise/fatigue, unexpected weight change and weight loss.  HENT: Negative for congestion, ear discharge, ear pain, hoarse voice, rhinorrhea, sinus pressure, sneezing and sore throat.   Eyes: Negative for blurred vision, photophobia, pain, discharge, redness and itching.  Respiratory: Positive for shortness of breath. Negative for cough, wheezing and stridor.   Cardiovascular: Negative for chest pain, palpitations, orthopnea and PND.  Gastrointestinal: Negative for abdominal pain, anorexia, blood in stool, change in bowel habit, constipation, diarrhea, hematemesis, hematochezia, melena, nausea and vomiting.  Endocrine: Negative for cold intolerance, heat intolerance, polydipsia, polyphagia and polyuria.  Genitourinary: Negative for dysuria, flank pain, frequency, hematuria, menorrhagia, menstrual problem, pelvic pain, urgency, vaginal bleeding and vaginal discharge.  Musculoskeletal: Negative for arthralgias, back pain, joint swelling, myalgias and neck pain.  Skin: Negative for rash.  Allergic/Immunologic: Negative for environmental allergies and food allergies.  Neurological: Negative for dizziness, vertigo, focal weakness, weakness, light-headedness, numbness and headaches.  Hematological: Negative for adenopathy. Does not bruise/bleed easily.  Psychiatric/Behavioral: Negative for dysphoric mood. The patient is not nervous/anxious.     Patient Active Problem List   Diagnosis Date Noted  . Chronic venous insufficiency 05/11/2020  . Aphasia 02/07/2019  . CKD (chronic kidney disease), stage III ([place]) 02/07/2019  . B12 deficiency 04/28/2016  . Essential hypertension 03/27/2015  . Hypothyroidism 03/27/2015  . Hyperlipidemia 03/27/2015  . Allergic rhinitis due to pollen 03/27/2015    No Known Allergies  Past Surgical History:  Procedure Laterality  Date  . COLONOSCOPY  2012   normal- Dr Sonny Masters  . ECTOPIC PREGNANCY SURGERY      Social History   Tobacco Use  . Smoking status: Never Smoker  . Smokeless tobacco: Never Used  . Tobacco comment: smoking cessation materials not required  Vaping Use  . Vaping Use: Never used  Substance Use Topics  . Alcohol use: No    Alcohol/week: 0.0 standard drinks  . Drug use: No     Medication list has been reviewed and updated.  Current Meds  Medication Sig  . amLODipine (NORVASC) 5 MG tablet Take 1 tablet (5 mg total) by mouth 2 (two) times daily.  . Calcium Carb-Cholecalciferol (CALCIUM 600 + D PO) Take 1 capsule by mouth 2 (two) times daily.  . clopidogrel (PLAVIX) 75 MG tablet Take 1 tablet (75 mg total) by mouth daily.  . cyanocobalamin (,VITAMIN B-12,) 1000 MCG/ML injection INJECT 1ML INTRAMUSCULARLY ONCE A MONTH  . fluticasone (FLONASE) 50 MCG/ACT nasal spray Place 2 sprays into both nostrils daily.  . hydrochlorothiazide (HYDRODIURIL) 12.5 MG tablet Take 1 tablet (12.5 mg total) by mouth daily.  Marland Kitchen levothyroxine (SYNTHROID) 50 MCG tablet Take 1 tablet (50 mcg total) by mouth daily.  Marland Kitchen losartan (COZAAR) 50 MG tablet Take 1 tablet (50 mg total) by mouth daily.  . Multiple Vitamins-Minerals (OCUVITE ADULT 50+ PO) Take 1 tablet by mouth daily.  . simvastatin (ZOCOR) 20 MG tablet TAKE 1 TABLET BY MOUTH EVERY DAY  . vitamin C (ASCORBIC ACID) 500 MG tablet Take 500 mg by mouth daily.    PHQ 2/9 Scores 12/10/2020 06/19/2020 03/23/2020 03/21/2020  PHQ - 2 Score 0 0 0 0  PHQ- 9 Score 0 0 0 -    GAD 7 : Generalized Anxiety Score 12/10/2020 06/19/2020 03/23/2020 12/15/2019  Nervous, Anxious, on Edge 0 0 0 0  Control/stop worrying 0 0 0 0  Worry too much - different things  0 0 0 0  Trouble relaxing 0 0 0 0  Restless 0 0 0 0  Easily annoyed or irritable 0 0 0 0  Afraid - awful might happen 0 0 0 0  Total GAD 7 Score 0 0 0 0    BP Readings from Last 3 Encounters:  12/10/20 112/62  10/10/20 (!)  117/57  06/19/20 120/76    Physical Exam Vitals and nursing note reviewed.  Constitutional:      General: She is not in acute distress.    Appearance: She is not diaphoretic.  HENT:     Head: Normocephalic and atraumatic.     Right Ear: Tympanic membrane and external ear normal.     Left Ear: Tympanic membrane and external ear normal.     Nose: Nose normal.  Eyes:     General:        Right eye: No discharge.        Left eye: No discharge.     Conjunctiva/sclera: Conjunctivae normal.     Pupils: Pupils are equal, round, and reactive to light.  Neck:     Thyroid: No thyromegaly.     Vascular: No JVD.  Cardiovascular:     Rate and Rhythm: Normal rate and regular rhythm.     Heart sounds: Normal heart sounds, S1 normal and S2 normal. No murmur heard.  No systolic murmur is present.  No diastolic murmur is present. No friction rub. No gallop. No S3 or S4 sounds.   Pulmonary:     Effort: Pulmonary effort is normal.     Breath sounds: Normal breath sounds. No decreased breath sounds, wheezing, rhonchi or rales.  Abdominal:     General: Bowel sounds are normal.     Palpations: Abdomen is soft. There is no mass.     Tenderness: There is no abdominal tenderness. There is no guarding.  Musculoskeletal:        General: Normal range of motion.     Cervical back: Normal range of motion and neck supple.  Lymphadenopathy:     Cervical: No cervical adenopathy.  Skin:    General: Skin is warm and dry.  Neurological:     Mental Status: She is alert.     Deep Tendon Reflexes: Reflexes are normal and symmetric.     Wt Readings from Last 3 Encounters:  12/10/20 147 lb (66.7 kg)  10/10/20 149 lb (67.6 kg)  06/19/20 151 lb (68.5 kg)    BP 112/62   Pulse 60   Ht 5\' 2"  (1.575 m)   Wt 147 lb (66.7 kg)   BMI 26.89 kg/m   Assessment and Plan:  1. Essential hypertension Chronic.  Controlled.  Stable.  Blood pressure today 112/62.  Continue amlodipine 5 mg 1 twice a day  hydrochlorothiazide 12.5 mg once a day and losartan 50 mg once a day. - amLODipine (NORVASC) 5 MG tablet; Take 1 tablet (5 mg total) by mouth 2 (two) times daily.  Dispense: 180 tablet; Refill: 1 - hydrochlorothiazide (HYDRODIURIL) 12.5 MG tablet; Take 1 tablet (12.5 mg total) by mouth daily.  Dispense: 90 tablet; Refill: 1 - losartan (COZAAR) 50 MG tablet; Take 1 tablet (50 mg total) by mouth daily.  Dispense: 90 tablet; Refill: 1  2. Cerebral vascular disease Chronic.  Controlled.  Stable.  Patient's had a history of cerebrovascular disease and we will continue Plavix 75 mg once a day. - clopidogrel (PLAVIX) 75 MG tablet; Take 1 tablet (75 mg total) by mouth daily.  Dispense: 90 tablet; Refill: 1  3. Non-seasonal allergic rhinitis, unspecified trigger Chronic.  Controlled.  Episodic.  And seasonal.  We will continue fluticasone nasal spray 2 sprays both nostrils daily. - fluticasone (FLONASE) 50 MCG/ACT nasal spray; Place 2 sprays into both nostrils daily.  Dispense: 16 g; Refill: 5  4. Hypothyroidism, unspecified type Chronic.  Controlled.  Stable.  Will obtain TSH and if appropriate will continue levothyroxine 50 mcg daily. - TSH - levothyroxine (SYNTHROID) 50 MCG tablet; Take 1 tablet (50 mcg total) by mouth daily.  Dispense: 90 tablet; Refill: 1  5. Mixed hyperlipidemia .  Controlled.  Stable.  Continue simvastatin 20 mg once a day. - simvastatin (ZOCOR) 20 MG tablet; TAKE 1 TABLET BY MOUTH EVERY DAY  Dispense: 90 tablet; Refill: 1  6. Prediabetes Chronic.  Controlled.  Stable.  A1c has been in the prediabetic range for the last couple years and will recheck A1c to see if we are maintaining levels. - HgB A1c

## 2020-12-11 LAB — HEMOGLOBIN A1C
Est. average glucose Bld gHb Est-mCnc: 123 mg/dL
Hgb A1c MFr Bld: 5.9 % — ABNORMAL HIGH (ref 4.8–5.6)

## 2020-12-11 LAB — TSH: TSH: 1.89 u[IU]/mL (ref 0.450–4.500)

## 2020-12-24 ENCOUNTER — Other Ambulatory Visit: Payer: Self-pay | Admitting: Family Medicine

## 2020-12-24 DIAGNOSIS — E538 Deficiency of other specified B group vitamins: Secondary | ICD-10-CM

## 2020-12-24 NOTE — Telephone Encounter (Signed)
Requested medication (s) are due for refill today: yes  Requested medication (s) are on the active medication list: yes  Last refill: 11/20/20  Future visit scheduled: no  Notes to clinic:  not delegated    Requested Prescriptions  Pending Prescriptions Disp Refills   cyanocobalamin (,VITAMIN B-12,) 1000 MCG/ML injection [Pharmacy Med Name: CYANOCOBALAMIN 1,000 MCG/ML VL] 1 mL 0    Sig: INJECT 1ML INTRAMUSCULARLY ONCE A MONTH      Off-Protocol Failed - 12/24/2020  1:28 AM      Failed - Medication not assigned to a protocol, review manually.      Passed - Valid encounter within last 12 months    Recent Outpatient Visits           2 weeks ago Essential hypertension   [facility] Juline Patch, [facility]   6 months ago Prediabetes   [facility] Juline Patch, [facility]   9 months ago Non-seasonal allergic rhinitis, unspecified trigger   [facility] Juline Patch, MD   1 year ago Essential hypertension   [facility] Juline Patch, MD   1 year ago Thoracic disc disorder   [facility] Juline Patch, MD               Off-Protocol Failed - 12/24/2020  1:28 AM      Failed - Medication not assigned to a protocol, review manually.      Passed - Valid encounter within last 12 months    Recent Outpatient Visits           2 weeks ago Essential hypertension   [place], [place]   6 months ago Prediabetes   [facility] Juline Patch, MD   9 months ago Non-seasonal allergic rhinitis, unspecified trigger   [facility] Juline Patch, MD   1 year ago Essential hypertension   [facility] Juline Patch, MD   1 year ago Thoracic disc disorder   [facility] Juline Patch, MD

## 2020-12-31 DIAGNOSIS — N2581 Secondary hyperparathyroidism of renal origin: Secondary | ICD-10-CM | POA: Diagnosis not present

## 2020-12-31 DIAGNOSIS — N184 Chronic kidney disease, stage 4 (severe): Secondary | ICD-10-CM | POA: Diagnosis not present

## 2020-12-31 DIAGNOSIS — I10 Essential (primary) hypertension: Secondary | ICD-10-CM | POA: Diagnosis not present

## 2020-12-31 DIAGNOSIS — D631 Anemia in chronic kidney disease: Secondary | ICD-10-CM | POA: Diagnosis not present

## 2021-01-15 DIAGNOSIS — M5137 Other intervertebral disc degeneration, lumbosacral region: Secondary | ICD-10-CM | POA: Diagnosis not present

## 2021-01-15 DIAGNOSIS — M5451 Vertebrogenic low back pain: Secondary | ICD-10-CM | POA: Diagnosis not present

## 2021-01-15 DIAGNOSIS — M9904 Segmental and somatic dysfunction of sacral region: Secondary | ICD-10-CM | POA: Diagnosis not present

## 2021-01-15 DIAGNOSIS — M5136 Other intervertebral disc degeneration, lumbar region: Secondary | ICD-10-CM | POA: Diagnosis not present

## 2021-01-15 DIAGNOSIS — M7918 Myalgia, other site: Secondary | ICD-10-CM | POA: Diagnosis not present

## 2021-01-15 DIAGNOSIS — M9903 Segmental and somatic dysfunction of lumbar region: Secondary | ICD-10-CM | POA: Diagnosis not present

## 2021-01-22 DIAGNOSIS — M5451 Vertebrogenic low back pain: Secondary | ICD-10-CM | POA: Diagnosis not present

## 2021-01-22 DIAGNOSIS — M5136 Other intervertebral disc degeneration, lumbar region: Secondary | ICD-10-CM | POA: Diagnosis not present

## 2021-01-22 DIAGNOSIS — M7918 Myalgia, other site: Secondary | ICD-10-CM | POA: Diagnosis not present

## 2021-01-22 DIAGNOSIS — M5137 Other intervertebral disc degeneration, lumbosacral region: Secondary | ICD-10-CM | POA: Diagnosis not present

## 2021-01-22 DIAGNOSIS — M9904 Segmental and somatic dysfunction of sacral region: Secondary | ICD-10-CM | POA: Diagnosis not present

## 2021-01-22 DIAGNOSIS — M9903 Segmental and somatic dysfunction of lumbar region: Secondary | ICD-10-CM | POA: Diagnosis not present

## 2021-01-31 ENCOUNTER — Other Ambulatory Visit: Payer: Self-pay | Admitting: Family Medicine

## 2021-01-31 DIAGNOSIS — E538 Deficiency of other specified B group vitamins: Secondary | ICD-10-CM

## 2021-01-31 NOTE — Telephone Encounter (Signed)
Requested medication (s) are due for refill today:  yes  Requested medication (s) are on the active medication list: yes   Last refill: 12/24/2020  Future visit scheduled: no  Notes to clinic: Medication not assigned to a protocol, review manually   Requested Prescriptions  Pending Prescriptions Disp Refills   cyanocobalamin (,VITAMIN B-12,) 1000 MCG/ML injection [Pharmacy Med Name: CYANOCOBALAMIN 1,000 MCG/ML VL] 1 mL 0    Sig: INJECT 1ML INTRAMUSCULARLY ONCE A MONTH      Off-Protocol Failed - 01/31/2021  1:35 AM      Failed - Medication not assigned to a protocol, review manually.      Passed - Valid encounter within last 12 months    Recent Outpatient Visits           1 month ago Essential hypertension   [facility] Juline Patch, MD   7 months ago Prediabetes   [facility] Juline Patch, MD   10 months ago Non-seasonal allergic rhinitis, unspecified trigger   [facility] Juline Patch, MD   1 year ago Essential hypertension   [facility] Juline Patch, MD   1 year ago Thoracic disc disorder   [facility] Juline Patch, MD               Off-Protocol Failed - 01/31/2021  1:35 AM      Failed - Medication not assigned to a protocol, review manually.      Passed - Valid encounter within last 12 months    Recent Outpatient Visits           1 month ago Essential hypertension   [place], MD   7 months ago Prediabetes   [facility] Juline Patch, MD   10 months ago Non-seasonal allergic rhinitis, unspecified trigger   [facility] Juline Patch, MD   1 year ago Essential hypertension   [facility] Juline Patch, MD   1 year ago Thoracic disc disorder   [facility] Juline Patch, MD

## 2021-02-24 ENCOUNTER — Other Ambulatory Visit: Payer: Self-pay | Admitting: Family Medicine

## 2021-02-24 DIAGNOSIS — E538 Deficiency of other specified B group vitamins: Secondary | ICD-10-CM

## 2021-02-24 NOTE — Telephone Encounter (Signed)
Requested medication (s) are due for refill today: yes  Requested medication (s) are on the active medication list: yes  Last refill:  01/31/21  Future visit scheduled: yes  Notes to clinic:  med not assigned to a protocol   Requested Prescriptions  Pending Prescriptions Disp Refills   cyanocobalamin (,VITAMIN B-12,) 1000 MCG/ML injection [Pharmacy Med Name: CYANOCOBALAMIN 1,000 MCG/ML VL] 1 mL 0    Sig: INJECT 1ML INTRAMUSCULARLY ONCE A MONTH      Off-Protocol Failed - 02/24/2021 10:32 AM      Failed - Medication not assigned to a protocol, review manually.      Passed - Valid encounter within last 12 months    Recent Outpatient Visits           2 months ago Essential hypertension   [facility] Juline Patch, MD   8 months ago Prediabetes   [facility] Juline Patch, MD   11 months ago Non-seasonal allergic rhinitis, unspecified trigger   [facility] Juline Patch, MD   1 year ago Essential hypertension   [facility] Juline Patch, MD   1 year ago Thoracic disc disorder   [facility] Juline Patch, MD               Off-Protocol Failed - 02/24/2021 10:32 AM      Failed - Medication not assigned to a protocol, review manually.      Passed - Valid encounter within last 12 months    Recent Outpatient Visits           2 months ago Essential hypertension   [place], MD   8 months ago Prediabetes   [facility] Juline Patch, MD   11 months ago Non-seasonal allergic rhinitis, unspecified trigger   [facility] Juline Patch, MD   1 year ago Essential hypertension   [facility] Juline Patch, MD   1 year ago Thoracic disc disorder   [facility] Juline Patch, MD

## 2021-03-21 ENCOUNTER — Other Ambulatory Visit: Payer: Self-pay | Admitting: Family Medicine

## 2021-03-21 DIAGNOSIS — E538 Deficiency of other specified B group vitamins: Secondary | ICD-10-CM

## 2021-03-21 NOTE — Telephone Encounter (Signed)
Requested medication (s) are due for refill today: yes  Requested medication (s) are on the active medication list: yes  Last refill:  02/25/2021  Future visit scheduled: no  Notes to clinic:  Medication not assigned to a protocol, review manually   Requested Prescriptions  Pending Prescriptions Disp Refills   cyanocobalamin (,VITAMIN B-12,) 1000 MCG/ML injection [Pharmacy Med Name: CYANOCOBALAMIN 1,000 MCG/ML VL] 1 mL 0    Sig: INJECT 1ML INTRAMUSCULARLY ONCE A MONTH     Off-Protocol Failed - 03/21/2021  8:34 AM      Failed - Medication not assigned to a protocol, review manually.      Passed - Valid encounter within last 12 months    Recent Outpatient Visits           3 months ago Essential hypertension   [facility] Juline Patch, MD   9 months ago Prediabetes   [facility] Juline Patch, MD   12 months ago Non-seasonal allergic rhinitis, unspecified trigger   [facility] Juline Patch, MD   1 year ago Essential hypertension   [facility] Juline Patch, MD   1 year ago Thoracic disc disorder   [facility] Juline Patch, MD             Off-Protocol Failed - 03/21/2021  8:34 AM      Failed - Medication not assigned to a protocol, review manually.      Passed - Valid encounter within last 12 months    Recent Outpatient Visits           3 months ago Essential hypertension   [place], MD   9 months ago Prediabetes   [facility] Juline Patch, MD   12 months ago Non-seasonal allergic rhinitis, unspecified trigger   [facility] Juline Patch, MD   1 year ago Essential hypertension   [facility] Juline Patch, MD   1 year ago Thoracic disc disorder   [facility] Juline Patch, MD

## 2021-03-25 ENCOUNTER — Ambulatory Visit (INDEPENDENT_AMBULATORY_CARE_PROVIDER_SITE_OTHER): Payer: Medicare HMO

## 2021-03-25 DIAGNOSIS — Z0000 Encounter for general adult medical examination without abnormal findings: Secondary | ICD-10-CM

## 2021-03-25 NOTE — Patient Instructions (Signed)
Carly Marshall , Thank you for taking time to come for your Medicare Wellness Visit. I appreciate your ongoing commitment to your health goals. Please review the following plan we discussed and let me know if I can assist you in the future.   Screening recommendations/referrals: Colonoscopy: no longer required Mammogram: no longer required Bone Density: no longer required  Recommended yearly ophthalmology/optometry visit for glaucoma screening and checkup Recommended yearly dental visit for hygiene and checkup  Vaccinations: Influenza vaccine: done 04/14/20 Pneumococcal vaccine: done 03/27/15 Tdap vaccine: done 03/27/15 Shingles vaccine: Shingrix discussed. Please contact your pharmacy for coverage information.  Covid-19:done 10/07/19, 11/08/19 & 05/31/20  Conditions/risks identified: Keep up the great work!  Next appointment: Follow up in one year for your annual wellness visit    Preventive Care 65 Years and Older, Female Preventive care refers to lifestyle choices and visits with your health care provider that can promote health and wellness. What does preventive care include? A yearly physical exam. This is also called an annual well check. Dental exams once or twice a year. Routine eye exams. Ask your health care provider how often you should have your eyes checked. Personal lifestyle choices, including: Daily care of your teeth and gums. Regular physical activity. Eating a healthy diet. Avoiding tobacco and drug use. Limiting alcohol use. Practicing safe sex. Taking low-dose aspirin every day. Taking vitamin and mineral supplements as recommended by your health care provider. What happens during an annual well check? The services and screenings done by your health care provider during your annual well check will depend on your age, overall health, lifestyle risk factors, and family history of disease. Counseling  Your health care provider may ask you questions about your: Alcohol  use. Tobacco use. Drug use. Emotional well-being. Home and relationship well-being. Sexual activity. Eating habits. History of falls. Memory and ability to understand (cognition). Work and work Statistician. Reproductive health. Screening  You may have the following tests or measurements: Height, weight, and BMI. Blood pressure. Lipid and cholesterol levels. These may be checked every 5 years, or more frequently if you are over 61 years old. Skin check. Lung cancer screening. You may have this screening every year starting at age 33 if you have a 30-pack-year history of smoking and currently smoke or have quit within the past 15 years. Fecal occult blood test (FOBT) of the stool. You may have this test every year starting at age 72. Flexible sigmoidoscopy or colonoscopy. You may have a sigmoidoscopy every 5 years or a colonoscopy every 10 years starting at age 7. Hepatitis C blood test. Hepatitis B blood test. Sexually transmitted disease (STD) testing. Diabetes screening. This is done by checking your blood sugar (glucose) after you have not eaten for a while (fasting). You may have this done every 1-3 years. Bone density scan. This is done to screen for osteoporosis. You may have this done starting at age 41. Mammogram. This may be done every 1-2 years. Talk to your health care provider about how often you should have regular mammograms. Talk with your health care provider about your test results, treatment options, and if necessary, the need for more tests. Vaccines  Your health care provider may recommend certain vaccines, such as: Influenza vaccine. This is recommended every year. Tetanus, diphtheria, and acellular pertussis (Tdap, Td) vaccine. You may need a Td booster every 10 years. Zoster vaccine. You may need this after age 51. Pneumococcal 13-valent conjugate (PCV13) vaccine. One dose is recommended after age 42. Pneumococcal polysaccharide (  PPSV23) vaccine. One dose is  recommended after age 41. Talk to your health care provider about which screenings and vaccines you need and how often you need them. This information is not intended to replace advice given to you by your health care provider. Make sure you discuss any questions you have with your health care provider. Document Released: 08/24/2015 Document Revised: 04/16/2016 Document Reviewed: 05/29/2015 Elsevier Interactive Patient Education  2017 [place] Prevention in the Home Falls can cause injuries. They can happen to people of all ages. There are many things you can do to make your home safe and to help prevent falls. What can I do on the outside of my home? Regularly fix the edges of walkways and driveways and fix any cracks. Remove anything that might make you trip as you walk through a door, such as a raised step or threshold. Trim any bushes or trees on the path to your home. Use bright outdoor lighting. Clear any walking paths of anything that might make someone trip, such as rocks or tools. Regularly check to see if handrails are loose or broken. Make sure that both sides of any steps have handrails. Any raised decks and porches should have guardrails on the edges. Have any leaves, snow, or ice cleared regularly. Use sand or salt on walking paths during winter. Clean up any spills in your garage right away. This includes oil or grease spills. What can I do in the bathroom? Use night lights. Install grab bars by the toilet and in the tub and shower. Do not use towel bars as grab bars. Use non-skid mats or decals in the tub or shower. If you need to sit down in the shower, use a plastic, non-slip stool. Keep the floor dry. Clean up any water that spills on the floor as soon as it happens. Remove soap buildup in the tub or shower regularly. Attach bath mats securely with double-sided non-slip rug tape. Do not have throw rugs and other things on the floor that can make you  trip. What can I do in the bedroom? Use night lights. Make sure that you have a light by your bed that is easy to reach. Do not use any sheets or blankets that are too big for your bed. They should not hang down onto the floor. Have a firm chair that has side arms. You can use this for support while you get dressed. Do not have throw rugs and other things on the floor that can make you trip. What can I do in the kitchen? Clean up any spills right away. Avoid walking on wet floors. Keep items that you use a lot in easy-to-reach places. If you need to reach something above you, use a strong step stool that has a grab bar. Keep electrical cords out of the way. Do not use floor polish or wax that makes floors slippery. If you must use wax, use non-skid floor wax. Do not have throw rugs and other things on the floor that can make you trip. What can I do with my stairs? Do not leave any items on the stairs. Make sure that there are handrails on both sides of the stairs and use them. Fix handrails that are broken or loose. Make sure that handrails are as long as the stairways. Check any carpeting to make sure that it is firmly attached to the stairs. Fix any carpet that is loose or worn. Avoid having throw rugs at the top or  bottom of the stairs. If you do have throw rugs, attach them to the floor with carpet tape. Make sure that you have a light switch at the top of the stairs and the bottom of the stairs. If you do not have them, ask someone to add them for you. What else can I do to help prevent falls? Wear shoes that: Do not have high heels. Have rubber bottoms. Are comfortable and fit you well. Are closed at the toe. Do not wear sandals. If you use a stepladder: Make sure that it is fully opened. Do not climb a closed stepladder. Make sure that both sides of the stepladder are locked into place. Ask someone to hold it for you, if possible. Clearly mark and make sure that you can  see: Any grab bars or handrails. First and last steps. Where the edge of each step is. Use tools that help you move around (mobility aids) if they are needed. These include: Canes. Walkers. Scooters. Crutches. Turn on the lights when you go into a dark area. Replace any light bulbs as soon as they burn out. Set up your furniture so you have a clear path. Avoid moving your furniture around. If any of your floors are uneven, fix them. If there are any pets around you, be aware of where they are. Review your medicines with your doctor. Some medicines can make you feel dizzy. This can increase your chance of falling. Ask your doctor what other things that you can do to help prevent falls. This information is not intended to replace advice given to you by your health care provider. Make sure you discuss any questions you have with your health care provider. Document Released: 05/24/2009 Document Revised: 01/03/2016 Document Reviewed: 09/01/2014 Elsevier Interactive Patient Education  2017 [employer].

## 2021-03-25 NOTE — Progress Notes (Signed)
Subjective:   Carly Marshall is a 85 y.o. female who presents for Medicare Annual (Subsequent) preventive examination.  Virtual Visit via Telephone Note  I connected with  Carly Marshall on 03/25/21 at  1:20 PM EDT by telephone and verified that I am speaking with the correct person using two identifiers.  Location: Patient: home Provider: [facility] Persons participating in the virtual visit: Carly Marshall   I discussed the limitations, risks, security and privacy concerns of performing an evaluation and management service by telephone and the availability of in person appointments. The patient expressed understanding and agreed to proceed.  Interactive audio and video telecommunications were attempted between this nurse and patient, however failed, due to patient having technical difficulties OR patient did not have access to video capability.  We continued and completed visit with audio only.  Some vital signs may be absent or patient reported.   Carly Marker, LPN   Review of Systems     Cardiac Risk Factors include: advanced age (>31men, >76 women);hypertension;dyslipidemia     Objective:    There were no vitals filed for this visit. There is no height or weight on file to calculate BMI.  Advanced Directives 03/25/2021 03/21/2020 03/14/2019 02/08/2019 02/07/2019 03/10/2018 03/02/2017  Does Patient Have a Medical Advance Directive? Yes Yes Yes No No No No  Type of Paramedic of [place];Living will [place];Living will [place];Living will - - - -  Copy of [place] in Chart? Yes - validated most recent copy scanned in chart (See row information) Yes - validated most recent copy scanned in chart (See row information) No - copy requested - - - -  Would patient like information on creating a medical advance directive? - - - No - Patient declined No - Patient declined Yes  (MAU/Ambulatory/Procedural Areas - Information given) Yes (MAU/Ambulatory/Procedural Areas - Information given)    Current Medications (verified) Outpatient Encounter Medications as of 03/25/2021  Medication Sig   amLODipine (NORVASC) 5 MG tablet Take 1 tablet (5 mg total) by mouth 2 (two) times daily.   Calcium Carb-Cholecalciferol (CALCIUM 600 + D PO) Take 1 capsule by mouth 2 (two) times daily.   clopidogrel (PLAVIX) 75 MG tablet Take 1 tablet (75 mg total) by mouth daily.   cyanocobalamin (,VITAMIN B-12,) 1000 MCG/ML injection INJECT 1ML INTRAMUSCULARLY ONCE A MONTH   fluticasone (FLONASE) 50 MCG/ACT nasal spray Place 2 sprays into both nostrils daily.   hydrochlorothiazide (HYDRODIURIL) 12.5 MG tablet Take 1 tablet (12.5 mg total) by mouth daily.   levothyroxine (SYNTHROID) 50 MCG tablet Take 1 tablet (50 mcg total) by mouth daily.   losartan (COZAAR) 50 MG tablet Take 1 tablet (50 mg total) by mouth daily.   Multiple Vitamins-Minerals (OCUVITE ADULT 50+ PO) Take 1 tablet by mouth daily.   OVER THE COUNTER MEDICATION Take by mouth. Take 1 tablet by mouth daily. "Doterra Deep Blue" From the website - Deep Blue Polyphenol Complex delivers polyphenol extracts of frankincense, turmeric, green tea, ginger, pomegranate, and grape seed, and is designed to provide soothing support to aching muscles and to other occasional discomfort   simvastatin (ZOCOR) 20 MG tablet TAKE 1 TABLET BY MOUTH EVERY DAY   vitamin C (ASCORBIC ACID) 500 MG tablet Take 500 mg by mouth daily.   [DISCONTINUED] cyclobenzaprine (FLEXERIL) 5 MG tablet Take 1 tablet (5 mg total) by mouth 3 (three) times daily as needed for muscle spasms. (Patient not taking:  Reported on 12/10/2020)   No facility-administered encounter medications on file as of 03/25/2021.    Allergies (verified) Patient has no known allergies.   History: Past Medical History:  Diagnosis Date   Allergy    B12 deficiency anemia    Hyperlipidemia     Hypertension    Stroke [facility])    Thyroid disease    Past Surgical History:  Procedure Laterality Date   COLONOSCOPY  2012   normal- Dr Sonny Masters   ECTOPIC PREGNANCY SURGERY     Family History  Problem Relation Age of Onset   Diabetes Mother    Heart disease Mother    Fibromyalgia Mother    Heart disease Father    Hyperlipidemia Father    Hypertension Father    Diabetes Sister    Brain cancer Brother    Rheum arthritis Sister    Social History   Socioeconomic History   Marital status: Widowed    Spouse name: Not on file   Number of children: 2   Years of education: Not on file   Highest education level: 12th grade  Occupational History   Occupation: Retired   Occupation: Programmer, applications: [employer]  Tobacco Use   Smoking status: Never   Smokeless tobacco: Never   Tobacco comments:    smoking cessation materials not required  Vaping Use   Vaping Use: Never used  Substance and Sexual Activity   Alcohol use: No    Alcohol/week: 0.0 standard drinks   Drug use: No   Sexual activity: Never  Other Topics Concern   Not on file  Social History Narrative   Husband passed away 12-Jun-2019   Social Determinants of Health   Financial Resource Strain: Low Risk    Difficulty of Paying Living Expenses: Not hard at all  Food Insecurity: No Food Insecurity   Worried About Charity fundraiser in the Last Year: Never true   [place] in the Last Year: Never true  Transportation Needs: No Transportation Needs   Lack of Transportation (Medical): No   Lack of Transportation (Non-Medical): No  Physical Activity: Inactive   Days of Exercise per Week: 0 days   Minutes of Exercise per Session: 0 min  Stress: No Stress Concern Present   Feeling of Stress : Not at all  Social Connections: Moderately Isolated   Frequency of Communication with Friends and Family: More than three times a week   Frequency of Social Gatherings with Friends and Family: More than  three times a week   Attends Religious Services: More than 4 times per year   Active Member of [employer] or Organizations: No   Attends Archivist Meetings: Never   Marital Status: Widowed    Tobacco Counseling Counseling given: Not Answered Tobacco comments: smoking cessation materials not required   Clinical Intake:  Pre-visit preparation completed: Yes  Pain : No/denies pain     Nutritional Risks: None Diabetes: No  How often do you need to have someone help you when you read instructions, pamphlets, or other written materials from your doctor or pharmacy?: 1 - Never    Interpreter Needed?: No  Information entered by :: Carly Marker LPN   Activities of Daily Living In your present state of health, do you have any difficulty performing the following activities: 03/25/2021  Hearing? N  Vision? N  Difficulty concentrating or making decisions? N  Walking or climbing stairs? N  Dressing or bathing? N  Doing errands, shopping? N  Preparing Food and eating ? N  Using the Toilet? N  In the past six months, have you accidently leaked urine? N  Do you have problems with loss of bowel control? N  Managing your Medications? N  Managing your Finances? N  Housekeeping or managing your Housekeeping? N  Some recent data might be hidden    Patient Care Team: Juline Patch, MD as PCP - General (Family Medicine) Magnus Sinning, MD as Consulting Physician (Nephrology)  Indicate any recent Medical Services you may have received from other than [facility] providers in the past year (date may be approximate).     Assessment:   This is a routine wellness examination for [place].  Hearing/Vision screen Hearing Screening - Comments:: Pt denies hearing difficulty Vision Screening - Comments:: Annual vision screenings done [facility]  Dietary issues and exercise activities discussed: Current Exercise Habits: The patient has a physically strenuous job, but has no  regular exercise apart from work., Exercise limited by: None identified   Goals Addressed             This Visit's Progress    DIET - INCREASE WATER INTAKE   On track    Recommend to drink at least 6-8 8oz glasses of water per day.       Depression Screen PHQ 2/9 Scores 03/25/2021 12/10/2020 06/19/2020 03/23/2020 03/21/2020 12/15/2019 11/21/2019  PHQ - 2 Score 0 0 0 0 0 0 0  PHQ- 9 Score - 0 0 0 - 0 0    Fall Risk Fall Risk  03/25/2021 06/19/2020 03/23/2020 03/21/2020 12/15/2019  Falls in the past year? 1 0 0 0 0  Comment - - - - -  Number falls in past yr: 1 - - 0 -  Comment - - - - -  Injury with Fall? 0 - - 0 -  Risk for fall due to : History of fall(s) - - No Fall Risks -  Risk for fall due to: Comment - - - - -  Follow up Falls prevention discussed Falls evaluation completed Falls evaluation completed Falls prevention discussed Falls evaluation completed    [place]:  Any stairs in or around the home? Yes  If so, are there any without handrails? Yes  - 4 steps outside Home free of loose throw rugs in walkways, pet beds, electrical cords, etc? Yes  Adequate lighting in your home to reduce risk of falls? Yes   ASSISTIVE DEVICES UTILIZED TO PREVENT FALLS:  Life alert? No  Use of a cane, walker or w/c? No  Grab bars in the bathroom? Yes  Shower chair or bench in shower? Yes  Elevated toilet seat or a handicapped toilet? No   TIMED UP AND GO:  Was the test performed? No . Telephonic visit.    Cognitive Function: Normal cognitive status assessed by direct observation by this Nurse Health Advisor. No abnormalities found.       6CIT Screen 03/21/2020 03/14/2019 03/10/2018 03/02/2017  What Year? 0 points 0 points 0 points 0 points  What month? 0 points 0 points 0 points 0 points  What time? 0 points 0 points 0 points 0 points  Count back from 20 0 points 0 points 0 points 0 points  Months in reverse 0 points 0 points 0 points 0 points  Repeat  phrase 0 points 0 points 2 points 0 points  Total Score 0 0 2 0  Immunizations Immunization History  Administered Date(s) Administered   Influenza, High Dose Seasonal PF 05/26/2017, 05/04/2018, 05/25/2019, 04/14/2020   Influenza,inj,Quad PF,6+ Mos 04/28/2016   Influenza-Unspecified 05/12/2015   PFIZER(Purple Top)SARS-COV-2 Vaccination 10/07/2019, 11/08/2019, 06/05/2020   PPD Test 05/29/2015   Pneumococcal Polysaccharide-23 03/27/2015   Tdap 03/27/2015    TDAP status: Up to date  Flu Vaccine status: Up to date  Pneumococcal vaccine status: Up to date  Covid-19 vaccine status: Completed vaccines  Qualifies for Shingles Vaccine? Yes   Zostavax completed No   Shingrix Completed?: No.    Education has been provided regarding the importance of this vaccine. Patient has been advised to call insurance company to determine out of pocket expense if they have not yet received this vaccine. Advised may also receive vaccine at local pharmacy or Health Dept. Verbalized acceptance and understanding.  Screening Tests Health Maintenance  Topic Date Due   Zoster Vaccines- Shingrix (1 of 2) Never done   COVID-19 Vaccine (4 - Booster for [employer] series) 10/06/2020   INFLUENZA VACCINE  03/11/2021   TETANUS/TDAP  03/26/2025   DEXA SCAN  Completed   PNA vac Low Risk Adult  Completed   HPV VACCINES  Aged Out    Health Maintenance  Health Maintenance Due  Topic Date Due   Zoster Vaccines- Shingrix (1 of 2) Never done   COVID-19 Vaccine (4 - Booster for [employer] series) 10/06/2020   INFLUENZA VACCINE  03/11/2021    Colorectal cancer screening: No longer required.   Mammogram status: No longer required due to age.  Bone density status: No longer required due to age  Lung Cancer Screening: (Low Dose CT Chest recommended if Age 49-80 years, 30 pack-year currently smoking OR have quit w/in 15years.) does not qualify.  Additional Screening:  Hepatitis C Screening: does not  qualify  Vision Screening: Recommended annual ophthalmology exams for early detection of glaucoma and other disorders of the eye. Is the patient up to date with their annual eye exam?  Yes  Who is the provider or what is the name of the office in which the patient attends annual eye exams? [place] Screening: Recommended annual dental exams for proper oral hygiene  Community Resource Referral / Chronic Care Management: CRR required this visit?  No   CCM required this visit?  No      Plan:     I have personally reviewed and noted the following in the patient's chart:   Medical and social history Use of alcohol, tobacco or illicit drugs  Current medications and supplements including opioid prescriptions.  Functional ability and status Nutritional status Physical activity Advanced directives List of other physicians Hospitalizations, surgeries, and ER visits in previous 12 months Vitals Screenings to include cognitive, depression, and falls Referrals and appointments  In addition, I have reviewed and discussed with patient certain preventive protocols, quality metrics, and best practice recommendations. A written personalized care plan for preventive services as well as general preventive health recommendations were provided to patient.     Carly Marker, LPN   [id number]/76/[id number]   Nurse Notes: none

## 2021-04-01 ENCOUNTER — Ambulatory Visit: Payer: Self-pay | Admitting: *Deleted

## 2021-04-01 NOTE — Telephone Encounter (Signed)
Patient is calling to report she is having R lower rib pain. Patient states the pain started over the weekend and has continued off/on. Patient states her pain is severe, she has pain with breathing and had some arm and neck pain earlier. Patient advised due to her symptoms- ED is advised. She will have her daughter take her.  Reason for Disposition  SEVERE chest pain  Answer Assessment - Initial Assessment Questions 1. LOCATION: "Where does it hurt?"       R lower ribs area 2. RADIATION: "Does the pain go anywhere else?" (e.g., into neck, jaw, arms, back)     no 3. ONSET: "When did the chest pain begin?" (Minutes, hours or days)      Most of the week -end 4. PATTERN "Does the pain come and go, or has it been constant since it started?"  "Does it get worse with exertion?"      Comes and goes- not as bad today as over the week end- worse with activity 5. DURATION: "How long does it last" (e.g., seconds, minutes, hours)     Not long- sitting is helpful 6. SEVERITY: "How bad is the pain?"  (e.g., Scale 1-10; mild, moderate, or severe)    - MILD (1-3): doesn't interfere with normal activities     - MODERATE (4-7): interferes with normal activities or awakens from sleep    - SEVERE (8-10): excruciating pain, unable to do any normal activities       severe 7. CARDIAC RISK FACTORS: "Do you have any history of heart problems or risk factors for heart disease?" (e.g., angina, prior heart attack; diabetes, high blood pressure, high cholesterol, smoker, or strong family history of heart disease)     High BP 8. PULMONARY RISK FACTORS: "Do you have any history of lung disease?"  (e.g., blood clots in lung, asthma, emphysema, birth control pills)     no 9. CAUSE: "What do you think is causing the chest pain?"     Unsure- possible pulled muscle 10. OTHER SYMPTOMS: "Do you have any other symptoms?" (e.g., dizziness, nausea, vomiting, sweating, fever, difficulty breathing, cough)       Not hungry, some  pain with breathing/cough 11. PREGNANCY: "Is there any chance you are pregnant?" "When was your last menstrual period?"       N/a  Protocols used: Chest Pain-A-AH

## 2021-04-10 DIAGNOSIS — M7918 Myalgia, other site: Secondary | ICD-10-CM | POA: Diagnosis not present

## 2021-04-10 DIAGNOSIS — M5451 Vertebrogenic low back pain: Secondary | ICD-10-CM | POA: Diagnosis not present

## 2021-04-10 DIAGNOSIS — M5137 Other intervertebral disc degeneration, lumbosacral region: Secondary | ICD-10-CM | POA: Diagnosis not present

## 2021-04-10 DIAGNOSIS — M5136 Other intervertebral disc degeneration, lumbar region: Secondary | ICD-10-CM | POA: Diagnosis not present

## 2021-04-10 DIAGNOSIS — M9903 Segmental and somatic dysfunction of lumbar region: Secondary | ICD-10-CM | POA: Diagnosis not present

## 2021-04-10 DIAGNOSIS — M9904 Segmental and somatic dysfunction of sacral region: Secondary | ICD-10-CM | POA: Diagnosis not present

## 2021-04-14 ENCOUNTER — Other Ambulatory Visit: Payer: Self-pay | Admitting: Family Medicine

## 2021-04-14 DIAGNOSIS — E538 Deficiency of other specified B group vitamins: Secondary | ICD-10-CM

## 2021-04-14 NOTE — Telephone Encounter (Signed)
Requested medication (s) are due for refill today: yes  Requested medication (s) are on the active medication list: yes  Last refill:  03/21/21  Future visit scheduled: no  Notes to clinic:  medication assigned to a protocol   Requested Prescriptions  Pending Prescriptions Disp Refills   cyanocobalamin (,VITAMIN B-12,) 1000 MCG/ML injection [Pharmacy Med Name: CYANOCOBALAMIN 1,000 MCG/ML VL] 1 mL 0    Sig: INJECT 1ML INTRAMUSCULARLY ONCE A MONTH     Off-Protocol Failed - 04/14/2021 10:30 AM      Failed - Medication not assigned to a protocol, review manually.      Passed - Valid encounter within last 12 months    Recent Outpatient Visits           4 months ago Essential hypertension   [facility] Juline Patch, MD   9 months ago Prediabetes   [facility] Juline Patch, MD   1 year ago Non-seasonal allergic rhinitis, unspecified trigger   [facility] Juline Patch, MD   1 year ago Essential hypertension   [facility] Juline Patch, MD   1 year ago Thoracic disc disorder   [facility] Juline Patch, MD             Off-Protocol Failed - 04/14/2021 10:30 AM      Failed - Medication not assigned to a protocol, review manually.      Passed - Valid encounter within last 12 months    Recent Outpatient Visits           4 months ago Essential hypertension   [place], MD   9 months ago Prediabetes   [facility] Juline Patch, MD   1 year ago Non-seasonal allergic rhinitis, unspecified trigger   [facility] Juline Patch, MD   1 year ago Essential hypertension   [facility] Juline Patch, MD   1 year ago Thoracic disc disorder   [facility] Juline Patch, MD

## 2021-05-01 DIAGNOSIS — R600 Localized edema: Secondary | ICD-10-CM | POA: Diagnosis not present

## 2021-05-01 DIAGNOSIS — I10 Essential (primary) hypertension: Secondary | ICD-10-CM | POA: Diagnosis not present

## 2021-05-01 DIAGNOSIS — N184 Chronic kidney disease, stage 4 (severe): Secondary | ICD-10-CM | POA: Diagnosis not present

## 2021-05-01 DIAGNOSIS — D631 Anemia in chronic kidney disease: Secondary | ICD-10-CM | POA: Diagnosis not present

## 2021-05-07 DIAGNOSIS — N2581 Secondary hyperparathyroidism of renal origin: Secondary | ICD-10-CM | POA: Diagnosis not present

## 2021-05-07 DIAGNOSIS — I10 Essential (primary) hypertension: Secondary | ICD-10-CM | POA: Diagnosis not present

## 2021-05-07 DIAGNOSIS — D631 Anemia in chronic kidney disease: Secondary | ICD-10-CM | POA: Diagnosis not present

## 2021-05-07 DIAGNOSIS — N1832 Chronic kidney disease, stage 3b: Secondary | ICD-10-CM | POA: Diagnosis not present

## 2021-05-12 ENCOUNTER — Other Ambulatory Visit: Payer: Self-pay | Admitting: Family Medicine

## 2021-05-12 DIAGNOSIS — E538 Deficiency of other specified B group vitamins: Secondary | ICD-10-CM

## 2021-05-12 NOTE — Telephone Encounter (Signed)
Requested medication (s) are due for refill today: no  Requested medication (s) are on the active medication list: yes  Last refill:  04/16/21 1 ml 1 RF  Future visit scheduled: yes  Notes to clinic:  Med not assigned to a protocol   Requested Prescriptions  Pending Prescriptions Disp Refills   cyanocobalamin (,VITAMIN B-12,) 1000 MCG/ML injection [Pharmacy Med Name: CYANOCOBALAMIN 1,000 MCG/ML VL] 1 mL 1    Sig: INJECT 1ML INTRAMUSCULARLY ONCE A MONTH     Off-Protocol Failed - 05/12/2021 10:27 AM      Failed - Medication not assigned to a protocol, review manually.      Passed - Valid encounter within last 12 months    Recent Outpatient Visits           5 months ago Essential hypertension   [place], MD   10 months ago Prediabetes   [facility] Juline Patch, MD   1 year ago Non-seasonal allergic rhinitis, unspecified trigger   [facility] Juline Patch, MD   1 year ago Essential hypertension   [facility] Juline Patch, MD   1 year ago Thoracic disc disorder   [facility] Juline Patch, MD       Future Appointments             In 1 month Juline Patch, MD [facility], [facility]           Off-Protocol Failed - 05/12/2021 10:27 AM      Failed - Medication not assigned to a protocol, review manually.      Passed - Valid encounter within last 12 months    Recent Outpatient Visits           5 months ago Essential hypertension   [place], MD   10 months ago Prediabetes   [facility] Juline Patch, MD   1 year ago Non-seasonal allergic rhinitis, unspecified trigger   [facility] Juline Patch, MD   1 year ago Essential hypertension   [facility] Juline Patch, MD   1 year ago Thoracic disc disorder   [facility] Juline Patch, MD       Future Appointments             In 1 month Juline Patch, MD  [facility], [facility]

## 2021-06-17 ENCOUNTER — Ambulatory Visit: Payer: Medicare HMO

## 2021-06-17 ENCOUNTER — Other Ambulatory Visit: Payer: Self-pay | Admitting: Family Medicine

## 2021-06-17 DIAGNOSIS — E782 Mixed hyperlipidemia: Secondary | ICD-10-CM

## 2021-06-17 NOTE — Telephone Encounter (Signed)
Requested medication (s) are due for refill today: Yes  Requested medication (s) are on the active medication list: Yes  Last refill:  12/10/20  Future visit scheduled: Yes  Notes to clinic:  Protocol indicates pt. Needs lab work.    Requested Prescriptions  Pending Prescriptions Disp Refills   simvastatin (ZOCOR) 20 MG tablet [Pharmacy Med Name: SIMVASTATIN 20 MG TABLET] 90 tablet 1    Sig: TAKE 1 TABLET BY MOUTH EVERY DAY     Cardiovascular:  Antilipid - Statins Failed - 06/17/2021  1:31 AM      Failed - Total Cholesterol in normal range and within 360 days    Cholesterol, Total  Date Value Ref Range Status  06/19/2020 138 100 - 199 mg/dL Final          Failed - LDL in normal range and within 360 days    LDL Chol Calc (NIH)  Date Value Ref Range Status  06/19/2020 59 0 - 99 mg/dL Final          Failed - HDL in normal range and within 360 days    HDL  Date Value Ref Range Status  06/19/2020 63 >39 mg/dL Final          Failed - Triglycerides in normal range and within 360 days    Triglycerides  Date Value Ref Range Status  06/19/2020 84 0 - 149 mg/dL Final          Passed - Patient is not pregnant      Passed - Valid encounter within last 12 months    Recent Outpatient Visits           6 months ago Essential hypertension   [place], Deanna C, MD   12 months ago Prediabetes   [facility] Juline Patch, MD   1 year ago Non-seasonal allergic rhinitis, unspecified trigger   [facility] Juline Patch, MD   1 year ago Essential hypertension   [facility] Juline Patch, MD   1 year ago Thoracic disc disorder   [facility] Juline Patch, MD       Future Appointments             Tomorrow Juline Patch, MD [facility], [facility]

## 2021-06-18 ENCOUNTER — Ambulatory Visit (INDEPENDENT_AMBULATORY_CARE_PROVIDER_SITE_OTHER): Payer: Medicare HMO | Admitting: Family Medicine

## 2021-06-18 ENCOUNTER — Encounter: Payer: Self-pay | Admitting: Family Medicine

## 2021-06-18 ENCOUNTER — Other Ambulatory Visit: Payer: Self-pay

## 2021-06-18 VITALS — BP 120/88 | HR 60 | Ht 62.0 in | Wt 141.0 lb

## 2021-06-18 DIAGNOSIS — I10 Essential (primary) hypertension: Secondary | ICD-10-CM

## 2021-06-18 DIAGNOSIS — Z23 Encounter for immunization: Secondary | ICD-10-CM | POA: Diagnosis not present

## 2021-06-18 DIAGNOSIS — E039 Hypothyroidism, unspecified: Secondary | ICD-10-CM | POA: Diagnosis not present

## 2021-06-18 DIAGNOSIS — J3089 Other allergic rhinitis: Secondary | ICD-10-CM

## 2021-06-18 DIAGNOSIS — R7303 Prediabetes: Secondary | ICD-10-CM | POA: Diagnosis not present

## 2021-06-18 DIAGNOSIS — I679 Cerebrovascular disease, unspecified: Secondary | ICD-10-CM

## 2021-06-18 DIAGNOSIS — E782 Mixed hyperlipidemia: Secondary | ICD-10-CM

## 2021-06-18 DIAGNOSIS — E538 Deficiency of other specified B group vitamins: Secondary | ICD-10-CM

## 2021-06-18 MED ORDER — AMLODIPINE BESYLATE 5 MG PO TABS: 5.0000 mg | ORAL_TABLET | Freq: Two times a day (BID) | ORAL | 1 refills | Status: DC

## 2021-06-18 MED ORDER — HYDROCHLOROTHIAZIDE 12.5 MG PO TABS: 12.5000 mg | ORAL_TABLET | Freq: Every day | ORAL | 1 refills | Status: DC

## 2021-06-18 MED ORDER — CYANOCOBALAMIN 1000 MCG/ML IJ SOLN: INTRAMUSCULAR | 5 refills | Status: DC

## 2021-06-18 MED ORDER — CLOPIDOGREL BISULFATE 75 MG PO TABS: 75.0000 mg | ORAL_TABLET | Freq: Every day | ORAL | 1 refills | Status: DC

## 2021-06-18 MED ORDER — FLUTICASONE PROPIONATE 50 MCG/ACT NA SUSP: 2.0000 | Freq: Every day | NASAL | 5 refills | Status: DC

## 2021-06-18 MED ORDER — SIMVASTATIN 20 MG PO TABS: 20.0000 mg | ORAL_TABLET | Freq: Every day | ORAL | 1 refills | Status: DC

## 2021-06-18 MED ORDER — LOSARTAN POTASSIUM 50 MG PO TABS: 50.0000 mg | ORAL_TABLET | Freq: Every day | ORAL | 1 refills | Status: DC

## 2021-06-18 MED ORDER — LEVOTHYROXINE SODIUM 50 MCG PO TABS: 50.0000 ug | ORAL_TABLET | Freq: Every day | ORAL | 1 refills | Status: DC

## 2021-06-18 NOTE — Progress Notes (Signed)
Date:  06/18/2021   Name:  Carly Marshall   DOB:  [birth year]   MRN:  [id number]   Chief Complaint: Prediabetes, Hyperlipidemia, Hypertension, Hypothyroidism, Allergic Rhinitis , cerebral vascular accident (Taking plavix for this), vitamin b12 deficiency, and pneum 20  Hyperlipidemia This is a chronic problem. The current episode started more than 1 year ago. The problem is controlled. Recent lipid tests were reviewed and are normal. She has no history of chronic renal disease, diabetes, hypothyroidism, liver disease, obesity or nephrotic syndrome. There are no known factors aggravating her hyperlipidemia. Pertinent negatives include no chest pain, focal sensory loss, focal weakness, leg pain, myalgias or shortness of breath. Current antihyperlipidemic treatment includes statins. The current treatment provides moderate improvement of lipids. There are no compliance problems.  Risk factors for coronary artery disease include diabetes mellitus, dyslipidemia and hypertension.  Hypertension This is a chronic problem. The current episode started more than 1 year ago. The problem has been gradually improving since onset. The problem is controlled. Pertinent negatives include no anxiety, blurred vision, chest pain, headaches, malaise/fatigue, neck pain, orthopnea, palpitations, peripheral edema, PND, shortness of breath or sweats. Risk factors for coronary artery disease include dyslipidemia. Past treatments include beta blockers, diuretics and angiotensin blockers. The current treatment provides moderate improvement. There are no compliance problems.  There is no history of angina, kidney disease, CAD/MI, CVA, heart failure, left ventricular hypertrophy, PVD or retinopathy. Identifiable causes of hypertension include a thyroid problem. There is no history of chronic renal disease, a hypertension causing med or renovascular disease.  Thyroid Problem Presents for follow-up visit. Patient reports no cold  intolerance, palpitations or weight gain. The symptoms have been stable. Her past medical history is significant for hyperlipidemia. There is no history of diabetes or heart failure.   Lab Results  Component Value Date   CREATININE 1.93 (H) 06/19/2020   BUN 37 (H) 06/19/2020   NA 143 06/19/2020   K 3.8 06/19/2020   CL 101 06/19/2020   CO2 24 06/19/2020   Lab Results  Component Value Date   CHOL 138 06/19/2020   HDL 63 06/19/2020   LDLCALC 59 06/19/2020   TRIG 84 06/19/2020   CHOLHDL 2.6 02/08/2019   Lab Results  Component Value Date   TSH 1.890 12/10/2020   Lab Results  Component Value Date   HGBA1C 5.9 (H) 12/10/2020   Lab Results  Component Value Date   WBC 7.9 06/19/2020   HGB 11.5 06/19/2020   HCT 35.2 06/19/2020   MCV 90 06/19/2020   PLT 283 06/19/2020   Lab Results  Component Value Date   ALT 13 06/19/2020   AST 18 06/19/2020   ALKPHOS 101 06/19/2020   BILITOT 0.5 06/19/2020     Review of Systems  Constitutional:  Negative for malaise/fatigue and weight gain.  Eyes:  Negative for blurred vision.  Respiratory:  Negative for shortness of breath.   Cardiovascular:  Negative for chest pain, palpitations, orthopnea and PND.  Endocrine: Negative for cold intolerance.  Musculoskeletal:  Negative for myalgias and neck pain.  Neurological:  Negative for focal weakness and headaches.   Patient Active Problem List   Diagnosis Date Noted   Edema of lower extremity 08/30/2020   Anemia in chronic kidney disease 08/30/2020   Chronic venous insufficiency 05/11/2020   Aphasia 02/07/2019   Chronic kidney disease, stage 4 (severe) ([place]) 02/07/2019   B12 deficiency 04/28/2016   Essential hypertension 03/27/2015   Hypothyroidism 03/27/2015   Hyperlipidemia 03/27/2015  Allergic rhinitis due to pollen 03/27/2015    No Known Allergies  Past Surgical History:  Procedure Laterality Date   COLONOSCOPY  2012   normal- Dr Sonny Masters   ECTOPIC PREGNANCY SURGERY       Social History   Tobacco Use   Smoking status: Never   Smokeless tobacco: Never   Tobacco comments:    smoking cessation materials not required  Vaping Use   Vaping Use: Never used  Substance Use Topics   Alcohol use: No    Alcohol/week: 0.0 standard drinks   Drug use: No     Medication list has been reviewed and updated.  Current Meds  Medication Sig   amLODipine (NORVASC) 5 MG tablet Take 1 tablet (5 mg total) by mouth 2 (two) times daily.   Calcium Carb-Cholecalciferol (CALCIUM 600 + D PO) Take 1 capsule by mouth 2 (two) times daily.   clopidogrel (PLAVIX) 75 MG tablet Take 1 tablet (75 mg total) by mouth daily.   cyanocobalamin (,VITAMIN B-12,) 1000 MCG/ML injection INJECT 1ML INTRAMUSCULARLY ONCE A MONTH   FARXIGA 5 MG TABS tablet Take 5 mg by mouth daily.   fluticasone (FLONASE) 50 MCG/ACT nasal spray Place 2 sprays into both nostrils daily.   hydrochlorothiazide (HYDRODIURIL) 12.5 MG tablet Take 1 tablet (12.5 mg total) by mouth daily.   levothyroxine (SYNTHROID) 50 MCG tablet Take 1 tablet (50 mcg total) by mouth daily.   losartan (COZAAR) 50 MG tablet Take 1 tablet (50 mg total) by mouth daily.   Multiple Vitamins-Minerals (OCUVITE ADULT 50+ PO) Take 1 tablet by mouth daily.   OVER THE COUNTER MEDICATION Take by mouth. Take 1 tablet by mouth daily. "Doterra Deep Blue" From the website - Deep Blue Polyphenol Complex delivers polyphenol extracts of frankincense, turmeric, green tea, ginger, pomegranate, and grape seed, and is designed to provide soothing support to aching muscles and to other occasional discomfort   simvastatin (ZOCOR) 20 MG tablet TAKE 1 TABLET BY MOUTH EVERY DAY   vitamin C (ASCORBIC ACID) 500 MG tablet Take 500 mg by mouth daily.    PHQ 2/9 Scores 03/25/2021 12/10/2020 06/19/2020 03/23/2020  PHQ - 2 Score 0 0 0 0  PHQ- 9 Score - 0 0 0    GAD 7 : Generalized Anxiety Score 12/10/2020 06/19/2020 03/23/2020 12/15/2019  Nervous, Anxious, on Edge 0 0 0 0   Control/stop worrying 0 0 0 0  Worry too much - different things 0 0 0 0  Trouble relaxing 0 0 0 0  Restless 0 0 0 0  Easily annoyed or irritable 0 0 0 0  Afraid - awful might happen 0 0 0 0  Total GAD 7 Score 0 0 0 0    BP Readings from Last 3 Encounters:  06/18/21 120/88  12/10/20 112/62  10/10/20 (!) 117/57    Physical Exam  Wt Readings from Last 3 Encounters:  06/18/21 141 lb (64 kg)  12/10/20 147 lb (66.7 kg)  10/10/20 149 lb (67.6 kg)    BP 120/88   Pulse 60   Ht 5\' 2"  (1.575 m)   Wt 141 lb (64 kg)   BMI 25.79 kg/m   Assessment and Plan:  1. Essential hypertension Chronic.  Controlled.  Stable.  Blood pressure 120/88.  Continue losartan 50 mg once a day, hydrochlorothiazide 12.5 mg once a day, and amlodipine 5 mg 1 twice a day.  Will check CMP for electrolytes and GFR. - amLODipine (NORVASC) 5 MG tablet; Take 1 tablet (5  mg total) by mouth 2 (two) times daily.  Dispense: 180 tablet; Refill: 1 - hydrochlorothiazide (HYDRODIURIL) 12.5 MG tablet; Take 1 tablet (12.5 mg total) by mouth daily.  Dispense: 90 tablet; Refill: 1 - losartan (COZAAR) 50 MG tablet; Take 1 tablet (50 mg total) by mouth daily.  Dispense: 90 tablet; Refill: 1 - Comprehensive Metabolic Panel (CMET)  2. Cerebral vascular disease Chronic.  Controlled.  Stable.  No further episodes to suggest cerebrovascular disease.  And at this point time we will continue Plavix 75 mg once a day. - clopidogrel (PLAVIX) 75 MG tablet; Take 1 tablet (75 mg total) by mouth daily.  Dispense: 90 tablet; Refill: 1  3. B12 deficiency Chronic.  Controlled.  Stable.  Continue vitamin B12 injections once a month that patient has at home via family member.  Will check CBC for current hemoglobin status. - cyanocobalamin (,VITAMIN B-12,) 1000 MCG/ML injection; Inject 61ml once a month  Dispense: 1 mL; Refill: 5 - CBC w/Diff/Platelet  4. Non-seasonal allergic rhinitis, unspecified trigger Chronic.  Controlled.  Stable.  This  is episodic and on a as needed basis patient is using Flonase nasal spray 2 sprays in both nostrils daily. - fluticasone (FLONASE) 50 MCG/ACT nasal spray; Place 2 sprays into both nostrils daily.  Dispense: 16 g; Refill: 5  5. Hypothyroidism, unspecified type Chronic.  Controlled.  Stable.  Symptomatically patient is doing well on current dosing of Synthroid 50 mcg daily.  Recheck TSH for current level of control. - levothyroxine (SYNTHROID) 50 MCG tablet; Take 1 tablet (50 mcg total) by mouth daily.  Dispense: 90 tablet; Refill: 1 - TSH  6. Mixed hyperlipidemia Chronic.  Controlled.  Stable.  Continue simvastatin 20 mg once a day.  Will check lipid panel for current level of control - simvastatin (ZOCOR) 20 MG tablet; Take 1 tablet (20 mg total) by mouth daily.  Dispense: 90 tablet; Refill: 1 - Lipid Panel With LDL/HDL Ratio  7. Prediabetes .  Controlled.  Patient is controlling situation with diet and is doing well at previous A1c's that have been in control range.  We will recheck A1c at this time to see if trend is continued. - HgB A1c  8. Need for pneumococcal vaccination Discussed and administered. - Pneumococcal conjugate vaccine 20-valent (Prevnar 20)

## 2021-06-19 LAB — CBC WITH DIFFERENTIAL/PLATELET
Basophils Absolute: 0 10*3/uL (ref 0.0–0.2)
Basos: 0 %
EOS (ABSOLUTE): 0.2 10*3/uL (ref 0.0–0.4)
Eos: 3 %
Hematocrit: 37.7 % (ref 34.0–46.6)
Hemoglobin: 12.2 g/dL (ref 11.1–15.9)
Immature Grans (Abs): 0 10*3/uL (ref 0.0–0.1)
Immature Granulocytes: 0 %
Lymphocytes Absolute: 1.8 10*3/uL (ref 0.7–3.1)
Lymphs: 26 %
MCH: 29 pg (ref 26.6–33.0)
MCHC: 32.4 g/dL (ref 31.5–35.7)
MCV: 90 fL (ref 79–97)
Monocytes Absolute: 0.6 10*3/uL (ref 0.1–0.9)
Monocytes: 9 %
Neutrophils Absolute: 4.3 10*3/uL (ref 1.4–7.0)
Neutrophils: 62 %
Platelets: 272 10*3/uL (ref 150–450)
RBC: 4.21 x10E6/uL (ref 3.77–5.28)
RDW: 13.2 % (ref 11.7–15.4)
WBC: 7 10*3/uL (ref 3.4–10.8)

## 2021-06-19 LAB — LIPID PANEL WITH LDL/HDL RATIO
Cholesterol, Total: 132 mg/dL (ref 100–199)
HDL: 64 mg/dL (ref >39)
LDL Chol Calc (NIH): 53 mg/dL (ref 0–99)
LDL/HDL Ratio: 0.8 ratio (ref 0.0–3.2)
Triglycerides: 74 mg/dL (ref 0–149)
VLDL Cholesterol Cal: 15 mg/dL (ref 5–40)

## 2021-06-19 LAB — COMPREHENSIVE METABOLIC PANEL
ALT: 13 IU/L (ref 0–32)
AST: 22 IU/L (ref 0–40)
Albumin/Globulin Ratio: 1.7 (ref 1.2–2.2)
Albumin: 4.9 g/dL — ABNORMAL HIGH (ref 3.6–4.6)
Alkaline Phosphatase: 116 IU/L (ref 44–121)
BUN/Creatinine Ratio: 20 (ref 12–28)
BUN: 34 mg/dL — ABNORMAL HIGH (ref 8–27)
Bilirubin Total: 0.6 mg/dL (ref 0.0–1.2)
CO2: 23 mmol/L (ref 20–29)
Calcium: 10.2 mg/dL (ref 8.7–10.3)
Chloride: 100 mmol/L (ref 96–106)
Creatinine, Ser: 1.66 mg/dL — ABNORMAL HIGH (ref 0.57–1.00)
Globulin, Total: 2.9 g/dL (ref 1.5–4.5)
Glucose: 100 mg/dL — ABNORMAL HIGH (ref 70–99)
Potassium: 4 mmol/L (ref 3.5–5.2)
Sodium: 141 mmol/L (ref 134–144)
Total Protein: 7.8 g/dL (ref 6.0–8.5)
eGFR: 30 mL/min/{1.73_m2} — ABNORMAL LOW (ref >59)

## 2021-06-19 LAB — TSH: TSH: 1.94 u[IU]/mL (ref 0.450–4.500)

## 2021-06-19 LAB — HEMOGLOBIN A1C
Est. average glucose Bld gHb Est-mCnc: 117 mg/dL
Hgb A1c MFr Bld: 5.7 % — ABNORMAL HIGH (ref 4.8–5.6)

## 2021-06-20 IMAGING — CT CT ABDOMEN W/O CM
2 of 4 series · 16 of 46 positions shown, 18 images · non-contrast
Comparison: None.

CLINICAL DATA: Hydronephrosis on prior ultrasound.

EXAM:
CT ABDOMEN WITHOUT CONTRAST
TECHNIQUE: Multidetector CT imaging of the abdomen was performed following the
standard protocol without IV contrast.

[Series 2: axial st · axial · 0.61mm/px · z∈[-338,-94]mm · 13 of 55 slices shown, 15 images]
[im 3/55  soft-tissue]
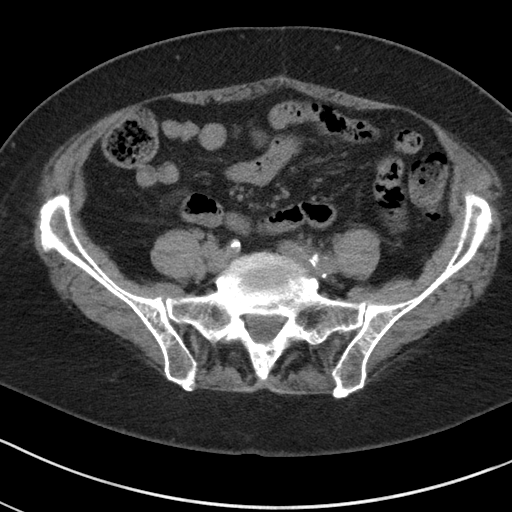
[im 3/55  bone]
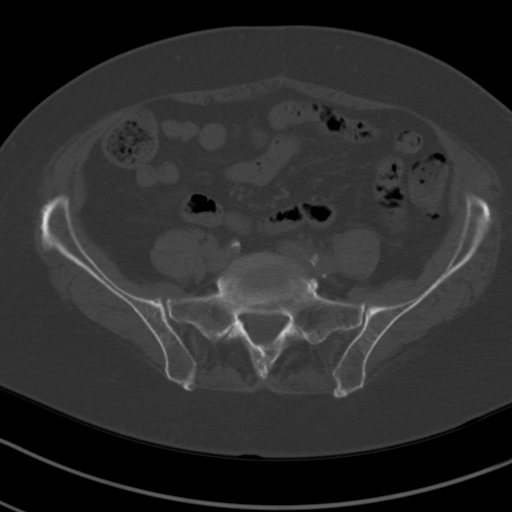
[im 8/55  soft-tissue]
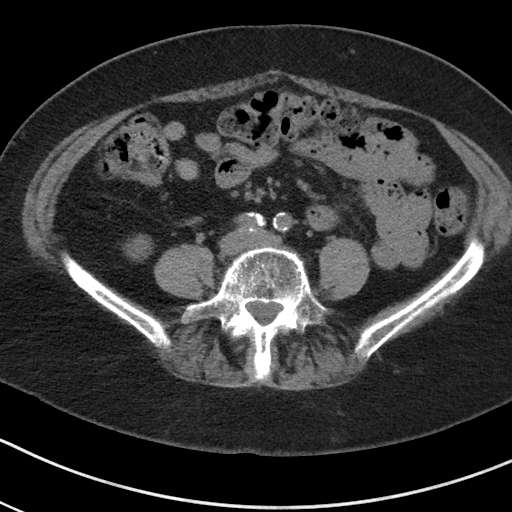
[im 11/55  soft-tissue]
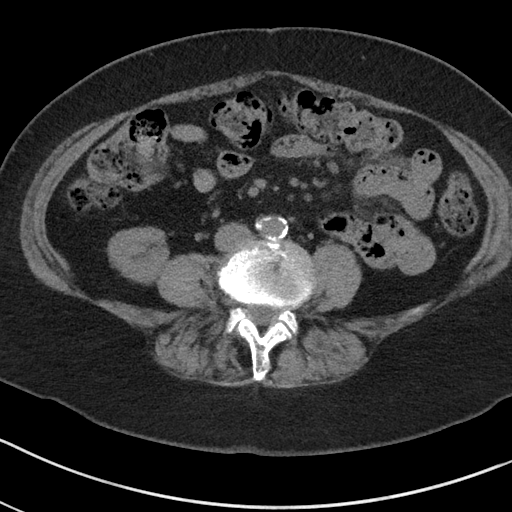
[im 16/55  soft-tissue]
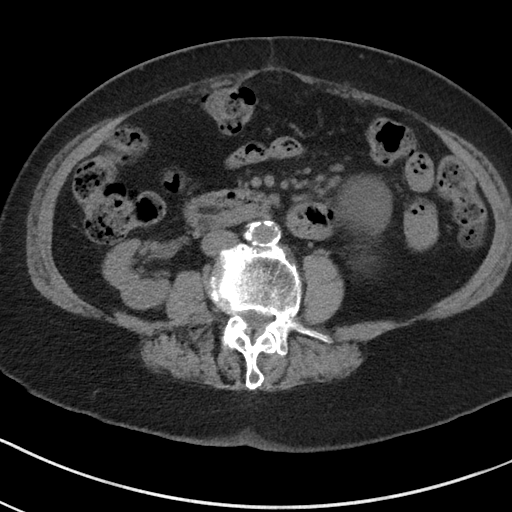
[im 19/55  soft-tissue]
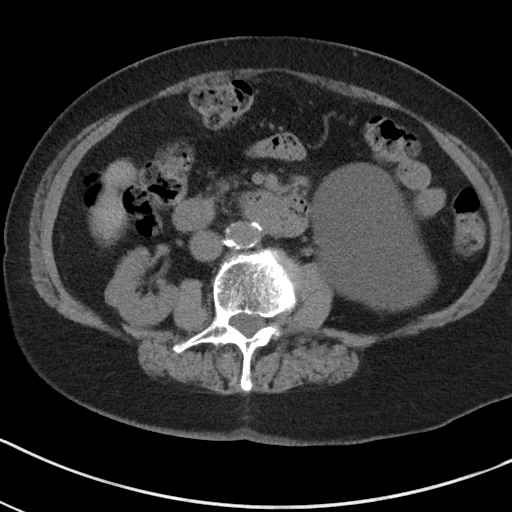
[im 24/55  soft-tissue]
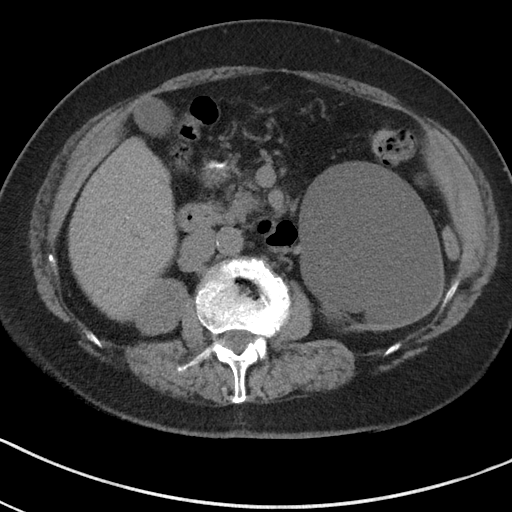
[im 29/55  soft-tissue]
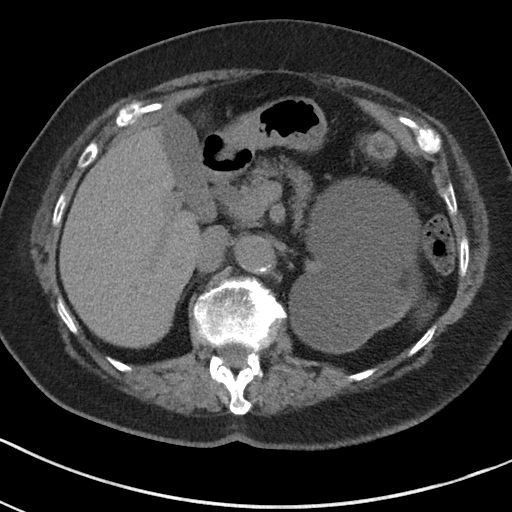
[im 31/55  soft-tissue]
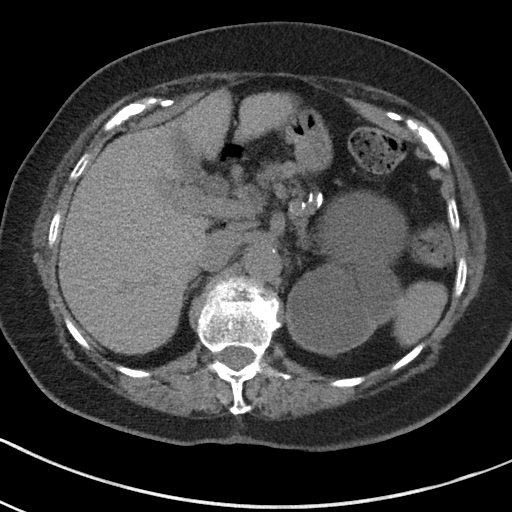
[im 37/55  soft-tissue]
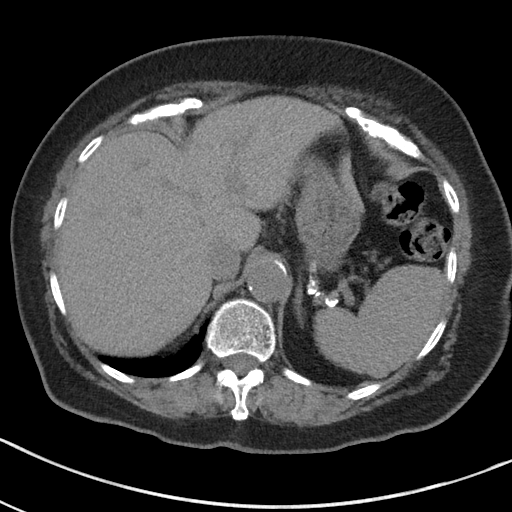
[im 37/55  bone]
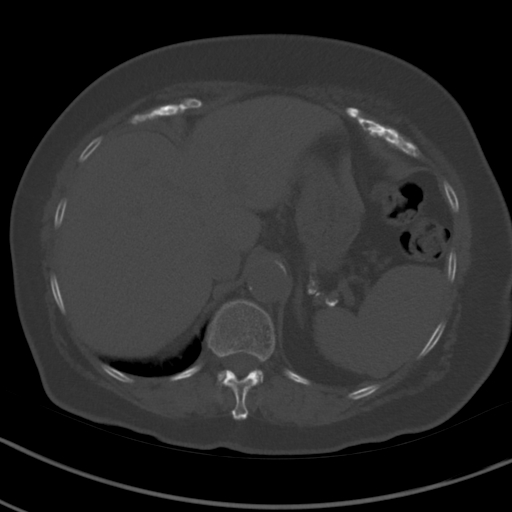
[im 39/55  soft-tissue]
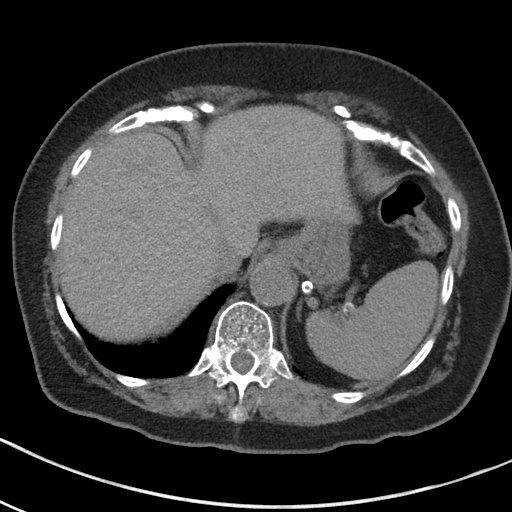
[im 44/55  soft-tissue]
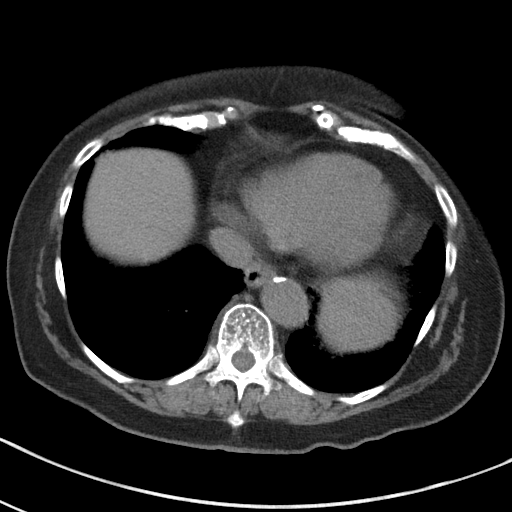
[im 47/55  soft-tissue]
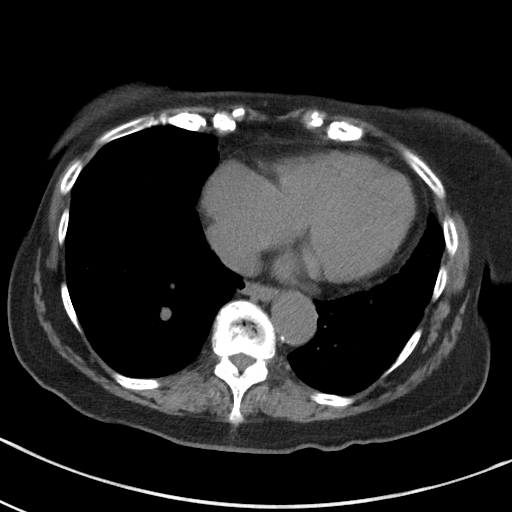
[im 52/55  soft-tissue]
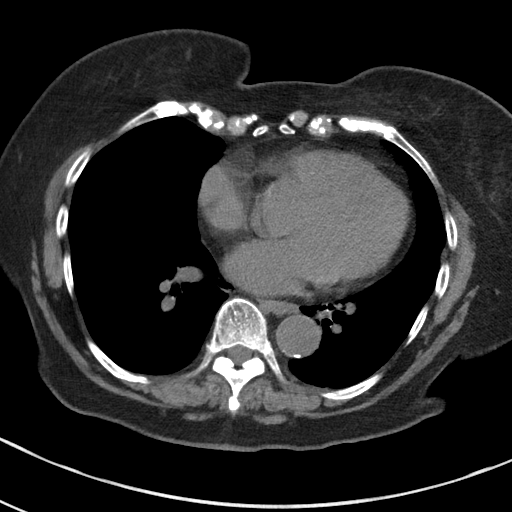

[Series 5: coronal st · coronal · 0.54mm/px · 3 of 91 slices shown]
[im 31/91  soft-tissue]
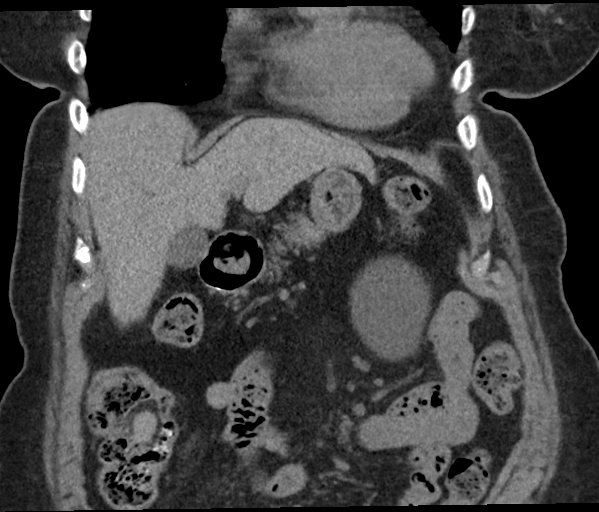
[im 41/91  soft-tissue]
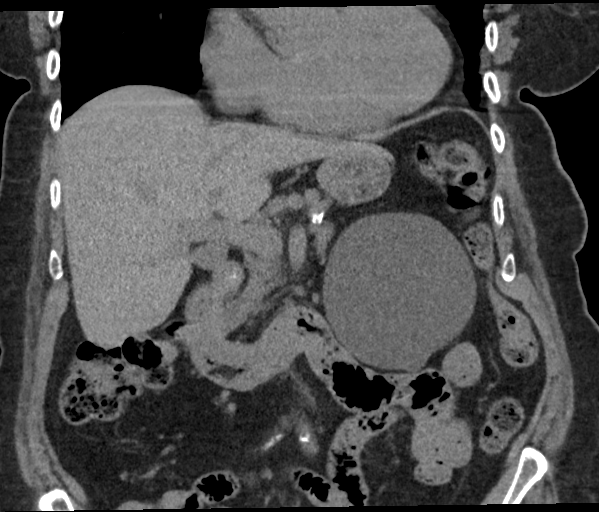
[im 51/91  soft-tissue]
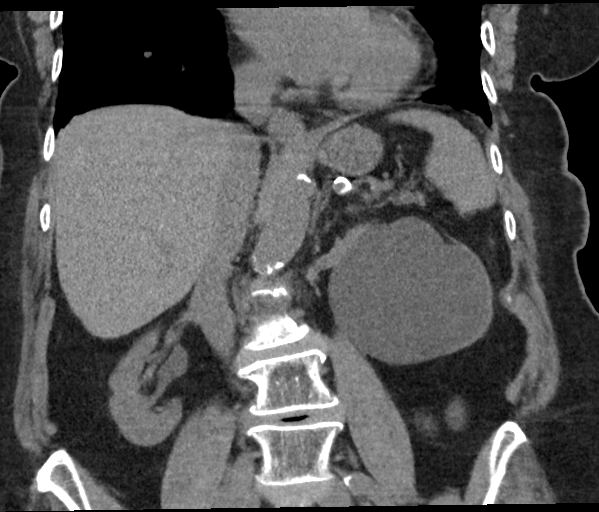

[16 of 46 positions shown; findings below may reference images not displayed]

FINDINGS: Lower chest: Heart size upper normal.

Hepatobiliary: No focal abnormality in the liver on this study
without intravenous contrast. There is no evidence for gallstones,
gallbladder wall thickening, or pericholecystic fluid. No
intrahepatic or extrahepatic biliary dilation.

Pancreas: No focal mass lesion. No dilatation of the main duct. No
intraparenchymal cyst. No peripancreatic edema.

Spleen: No splenomegaly. No focal mass lesion.

Adrenals/Urinary Tract: No adrenal nodule or mass. Right kidney
unremarkable on this noncontrast study. Prominent cystic change
noted in the left kidney, apparently reflecting dramatic
hydronephrosis with marked overlying cortical thinning. No
associated hydroureter evident suggesting this is related to
obstruction at the level of the UPJ. No calcified stone evident.
Obstruction may be congenital or related to soft tissue lesion.

Stomach/Bowel: Stomach is decompressed. Duodenum is normally
positioned as is the ligament of Treitz. Duodenal diverticulum
noted. No small bowel or colonic dilatation within the visualized
abdomen.

Vascular/Lymphatic: There is abdominal aortic atherosclerosis
without aneurysm. There is no gastrohepatic or hepatoduodenal
ligament lymphadenopathy. No retroperitoneal or mesenteric
lymphadenopathy.

Other: No intraperitoneal free fluid.

Musculoskeletal: No worrisome lytic or sclerotic osseous
abnormality.
IMPRESSION: 1. Prominent cystic change in the left kidney, most suggesting
dramatic hydronephrosis with marked overlying cortical thinning. As
there is no associated hydroureter, findings compatible with
obstruction at the level of the UPJ. No obstructing calcified stone.
Obstruction may be congenital or related to nonvisualized soft
tissue lesion.
2. Aortic Atherosclerosis ([id number]).

## 2021-09-03 DIAGNOSIS — N2581 Secondary hyperparathyroidism of renal origin: Secondary | ICD-10-CM | POA: Diagnosis not present

## 2021-09-03 DIAGNOSIS — R8290 Unspecified abnormal findings in urine: Secondary | ICD-10-CM | POA: Diagnosis not present

## 2021-09-03 DIAGNOSIS — I10 Essential (primary) hypertension: Secondary | ICD-10-CM | POA: Diagnosis not present

## 2021-09-03 DIAGNOSIS — D631 Anemia in chronic kidney disease: Secondary | ICD-10-CM | POA: Diagnosis not present

## 2021-09-03 DIAGNOSIS — N1832 Chronic kidney disease, stage 3b: Secondary | ICD-10-CM | POA: Diagnosis not present

## 2021-09-10 DIAGNOSIS — I10 Essential (primary) hypertension: Secondary | ICD-10-CM | POA: Diagnosis not present

## 2021-09-10 DIAGNOSIS — N184 Chronic kidney disease, stage 4 (severe): Secondary | ICD-10-CM | POA: Diagnosis not present

## 2021-09-10 DIAGNOSIS — R600 Localized edema: Secondary | ICD-10-CM | POA: Diagnosis not present

## 2021-09-10 DIAGNOSIS — N2581 Secondary hyperparathyroidism of renal origin: Secondary | ICD-10-CM | POA: Diagnosis not present

## 2021-12-04 DIAGNOSIS — H35033 Hypertensive retinopathy, bilateral: Secondary | ICD-10-CM | POA: Diagnosis not present

## 2021-12-04 DIAGNOSIS — H524 Presbyopia: Secondary | ICD-10-CM | POA: Diagnosis not present

## 2021-12-09 ENCOUNTER — Other Ambulatory Visit: Payer: Self-pay | Admitting: Family Medicine

## 2021-12-09 DIAGNOSIS — E538 Deficiency of other specified B group vitamins: Secondary | ICD-10-CM

## 2021-12-10 ENCOUNTER — Encounter: Payer: Self-pay | Admitting: Family Medicine

## 2021-12-10 ENCOUNTER — Ambulatory Visit (INDEPENDENT_AMBULATORY_CARE_PROVIDER_SITE_OTHER): Payer: Medicare HMO | Admitting: Family Medicine

## 2021-12-10 VITALS — BP 130/70 | HR 76 | Ht 62.0 in | Wt 142.0 lb

## 2021-12-10 DIAGNOSIS — I10 Essential (primary) hypertension: Secondary | ICD-10-CM | POA: Diagnosis not present

## 2021-12-10 DIAGNOSIS — H1033 Unspecified acute conjunctivitis, bilateral: Secondary | ICD-10-CM

## 2021-12-10 DIAGNOSIS — R7303 Prediabetes: Secondary | ICD-10-CM | POA: Diagnosis not present

## 2021-12-10 DIAGNOSIS — J3089 Other allergic rhinitis: Secondary | ICD-10-CM | POA: Diagnosis not present

## 2021-12-10 DIAGNOSIS — E039 Hypothyroidism, unspecified: Secondary | ICD-10-CM | POA: Diagnosis not present

## 2021-12-10 DIAGNOSIS — E782 Mixed hyperlipidemia: Secondary | ICD-10-CM | POA: Diagnosis not present

## 2021-12-10 DIAGNOSIS — I679 Cerebrovascular disease, unspecified: Secondary | ICD-10-CM

## 2021-12-10 MED ORDER — LEVOTHYROXINE SODIUM 50 MCG PO TABS: 50.0000 ug | ORAL_TABLET | Freq: Every day | ORAL | 1 refills | Status: DC

## 2021-12-10 MED ORDER — AMLODIPINE BESYLATE 5 MG PO TABS: 5.0000 mg | ORAL_TABLET | Freq: Two times a day (BID) | ORAL | 1 refills | Status: DC

## 2021-12-10 MED ORDER — SIMVASTATIN 20 MG PO TABS: 20.0000 mg | ORAL_TABLET | Freq: Every day | ORAL | 1 refills | Status: DC

## 2021-12-10 MED ORDER — LOSARTAN POTASSIUM 50 MG PO TABS: 50.0000 mg | ORAL_TABLET | Freq: Every day | ORAL | 1 refills | Status: DC

## 2021-12-10 MED ORDER — FLUTICASONE PROPIONATE 50 MCG/ACT NA SUSP: 2.0000 | Freq: Every day | NASAL | 5 refills | Status: DC

## 2021-12-10 MED ORDER — CIPROFLOXACIN HCL 0.3 % OP SOLN: 1.0000 [drp] | OPHTHALMIC | 0 refills | Status: DC

## 2021-12-10 MED ORDER — HYDROCHLOROTHIAZIDE 12.5 MG PO TABS: 12.5000 mg | ORAL_TABLET | Freq: Every day | ORAL | 1 refills | Status: DC

## 2021-12-10 MED ORDER — CLOPIDOGREL BISULFATE 75 MG PO TABS: 75.0000 mg | ORAL_TABLET | Freq: Every day | ORAL | 1 refills | Status: DC

## 2021-12-10 NOTE — Progress Notes (Signed)
? ? ?Date:  12/10/2021  ? ?Name:  Carly Marshall   DOB:  [birth year]   MRN:  [id number] ? ? ?Chief Complaint: Eye Problem (L) eye Red and irritated. "Feels like something is in there"- using warm compresses and otc eye drops for pink eye. Been hurting x 2 days), Hypertension, Hyperlipidemia, Hypothyroidism, Allergic Rhinitis , Cerebrovascular Accident (Takes plavix due to mini stroke), and Prediabetes ? ?Hypertension ?This is a chronic problem. The current episode started more than 1 year ago. The problem has been gradually improving since onset. The problem is controlled. Pertinent negatives include no anxiety, chest pain, headaches, malaise/fatigue, neck pain, palpitations, peripheral edema or shortness of breath. Past treatments include angiotensin blockers, calcium channel blockers and diuretics. The current treatment provides moderate improvement. Identifiable causes of hypertension include a thyroid problem. There is no history of chronic renal disease.  ?Hyperlipidemia ?This is a chronic problem. The current episode started more than 1 year ago. The problem is controlled. Recent lipid tests were reviewed and are normal. She has no history of chronic renal disease, diabetes, hypothyroidism, liver disease, obesity or nephrotic syndrome. Pertinent negatives include no chest pain, myalgias or shortness of breath. Current antihyperlipidemic treatment includes statins. The current treatment provides moderate improvement of lipids. There are no compliance problems.  Risk factors for coronary artery disease include dyslipidemia and hypertension.  ?Cerebrovascular Accident ?This is a chronic problem. The current episode started more than 1 year ago. Pertinent negatives include no abdominal pain, chest pain, chills, coughing, fever, headaches, myalgias, nausea, neck pain, rash or sore throat. Treatments tried: plavix. The treatment provided moderate relief.  ?Conjunctivitis  ?The current episode started 2 days ago. The  onset was sudden. The problem has been gradually worsening. The problem is moderate. Nothing relieves the symptoms. Nothing aggravates the symptoms. Associated symptoms include eye itching, eye discharge and eye redness. Pertinent negatives include no fever, no decreased vision, no abdominal pain, no constipation, no diarrhea, no nausea, no ear discharge, no ear pain, no headaches, no rhinorrhea, no sore throat, no stridor, no neck pain, no cough, no wheezing and no rash.  ?Thyroid Problem ?Presents for follow-up visit. Patient reports no anxiety, constipation, diarrhea, dry skin, hair loss, nail problem, palpitations, weight gain or weight loss. Her past medical history is significant for hyperlipidemia. There is no history of diabetes.  ? ?Lab Results  ?Component Value Date  ? NA 141 06/18/2021  ? K 4.0 06/18/2021  ? CO2 23 06/18/2021  ? GLUCOSE 100 (H) 06/18/2021  ? BUN 34 (H) 06/18/2021  ? CREATININE 1.66 (H) 06/18/2021  ? CALCIUM 10.2 06/18/2021  ? EGFR 30 (L) 06/18/2021  ? GFRNONAA 24 (L) 06/19/2020  ? ?Lab Results  ?Component Value Date  ? CHOL 132 06/18/2021  ? HDL 64 06/18/2021  ? [place] 53 06/18/2021  ? TRIG 74 06/18/2021  ? CHOLHDL 2.6 02/08/2019  ? ?Lab Results  ?Component Value Date  ? TSH 1.940 06/18/2021  ? ?Lab Results  ?Component Value Date  ? HGBA1C 5.7 (H) 06/18/2021  ? ?Lab Results  ?Component Value Date  ? WBC 7.0 06/18/2021  ? HGB 12.2 06/18/2021  ? HCT 37.7 06/18/2021  ? MCV 90 06/18/2021  ? PLT 272 06/18/2021  ? ?Lab Results  ?Component Value Date  ? ALT 13 06/18/2021  ? AST 22 06/18/2021  ? ALKPHOS 116 06/18/2021  ? BILITOT 0.6 06/18/2021  ? ?No results found for: 25OHVITD2, [place], VD25OH  ? ?Review of Systems  ?Constitutional:  Negative for chills,  fever, malaise/fatigue, weight gain and weight loss.  ?HENT:  Negative for drooling, ear discharge, ear pain, rhinorrhea and sore throat.   ?Eyes:  Positive for discharge, redness and itching.  ?Respiratory:  Negative for cough, shortness of  breath, wheezing and stridor.   ?Cardiovascular:  Negative for chest pain, palpitations and leg swelling.  ?Gastrointestinal:  Negative for abdominal pain, blood in stool, constipation, diarrhea and nausea.  ?Endocrine: Negative for polydipsia.  ?Genitourinary:  Negative for dysuria, frequency, hematuria and urgency.  ?Musculoskeletal:  Negative for back pain, myalgias and neck pain.  ?Skin:  Negative for rash.  ?Allergic/Immunologic: Negative for environmental allergies.  ?Neurological:  Negative for dizziness and headaches.  ?Hematological:  Does not bruise/bleed easily.  ?Psychiatric/Behavioral:  Negative for suicidal ideas. The patient is not nervous/anxious.   ? ?Patient Active Problem List  ? Diagnosis Date Noted  ? Edema of lower extremity 08/30/2020  ? Anemia in chronic kidney disease 08/30/2020  ? Chronic venous insufficiency 05/11/2020  ? Aphasia 02/07/2019  ? Chronic kidney disease, stage 4 (severe) ([place]) 02/07/2019  ? B12 deficiency 04/28/2016  ? Essential hypertension 03/27/2015  ? Hypothyroidism 03/27/2015  ? Hyperlipidemia 03/27/2015  ? Allergic rhinitis due to pollen 03/27/2015  ? ? ?No Known Allergies ? ?Past Surgical History:  ?Procedure Laterality Date  ? COLONOSCOPY  2012  ? normal- Dr Sonny Masters  ? ECTOPIC PREGNANCY SURGERY    ? ? ?Social History  ? ?Tobacco Use  ? Smoking status: Never  ? Smokeless tobacco: Never  ? Tobacco comments:  ?  smoking cessation materials not required  ?Vaping Use  ? Vaping Use: Never used  ?Substance Use Topics  ? Alcohol use: No  ?  Alcohol/week: 0.0 standard drinks  ? Drug use: No  ? ? ? ?Medication list has been reviewed and updated. ? ?Current Meds  ?Medication Sig  ? amLODipine (NORVASC) 5 MG tablet Take 1 tablet (5 mg total) by mouth 2 (two) times daily.  ? Calcium Carb-Cholecalciferol (CALCIUM 600 + D PO) Take 1 capsule by mouth 2 (two) times daily.  ? clopidogrel (PLAVIX) 75 MG tablet Take 1 tablet (75 mg total) by mouth daily.  ? cyanocobalamin (,VITAMIN B-12,)  1000 MCG/ML injection INJECT 1ML ONCE A MONTH  ? dapagliflozin propanediol (FARXIGA) 10 MG TABS tablet Take 10 mg by mouth daily. singh  ? fluticasone (FLONASE) 50 MCG/ACT nasal spray Place 2 sprays into both nostrils daily.  ? hydrochlorothiazide (HYDRODIURIL) 12.5 MG tablet Take 1 tablet (12.5 mg total) by mouth daily.  ? levothyroxine (SYNTHROID) 50 MCG tablet Take 1 tablet (50 mcg total) by mouth daily.  ? losartan (COZAAR) 50 MG tablet Take 1 tablet (50 mg total) by mouth daily.  ? Multiple Vitamins-Minerals (OCUVITE ADULT 50+ PO) Take 1 tablet by mouth daily.  ? OVER THE COUNTER MEDICATION Take by mouth. Take 1 tablet by mouth daily. "Doterra Deep Blue" From the website - Deep Blue Polyphenol Complex delivers polyphenol extracts of frankincense, turmeric, green tea, ginger, pomegranate, and grape seed, and is designed to provide soothing support to aching muscles and to other occasional discomfort  ? simvastatin (ZOCOR) 20 MG tablet Take 1 tablet (20 mg total) by mouth daily.  ? vitamin C (ASCORBIC ACID) 500 MG tablet Take 500 mg by mouth daily.  ? ? ? ?  12/10/2021  ? 10:53 AM 12/10/2020  ?  1:31 PM 06/19/2020  ?  1:50 PM 03/23/2020  ?  3:07 PM  ?GAD 7 : Generalized Anxiety Score  ?Nervous,  Anxious, on Edge 0 0 0 0  ?Control/stop worrying 0 0 0 0  ?Worry too much - different things 0 0 0 0  ?Trouble relaxing 0 0 0 0  ?Restless 0 0 0 0  ?Easily annoyed or irritable 0 0 0 0  ?Afraid - awful might happen 0 0 0 0  ?Total GAD 7 Score 0 0 0 0  ?Anxiety Difficulty Not difficult at all     ? ? ? ?  12/10/2021  ? 10:53 AM  ?Depression screen PHQ 2/9  ?Decreased Interest 0  ?Down, Depressed, Hopeless 0  ?PHQ - 2 Score 0  ?Altered sleeping 1  ?Tired, decreased energy 1  ?Change in appetite 1  ?Feeling bad or failure about yourself  0  ?Trouble concentrating 0  ?Moving slowly or fidgety/restless 0  ?Suicidal thoughts 0  ?PHQ-9 Score 3  ?Difficult doing work/chores Not difficult at all  ? ? ?BP Readings from Last 3 Encounters:   ?12/10/21 130/70  ?06/18/21 120/88  ?12/10/20 112/62  ? ? ?Physical Exam ?Vitals and nursing note reviewed.  ?Constitutional:   ?   Appearance: She is well-developed.  ?HENT:  ?   Head: Normocephalic.

## 2021-12-11 LAB — TSH: TSH: 1.99 u[IU]/mL (ref 0.450–4.500)

## 2021-12-11 LAB — HEMOGLOBIN A1C
Est. average glucose Bld gHb Est-mCnc: 123 mg/dL
Hgb A1c MFr Bld: 5.9 % — ABNORMAL HIGH (ref 4.8–5.6)

## 2021-12-16 ENCOUNTER — Ambulatory Visit: Payer: Medicare HMO | Admitting: Family Medicine

## 2021-12-24 DIAGNOSIS — M9904 Segmental and somatic dysfunction of sacral region: Secondary | ICD-10-CM | POA: Diagnosis not present

## 2021-12-24 DIAGNOSIS — M5137 Other intervertebral disc degeneration, lumbosacral region: Secondary | ICD-10-CM | POA: Diagnosis not present

## 2021-12-24 DIAGNOSIS — M5451 Vertebrogenic low back pain: Secondary | ICD-10-CM | POA: Diagnosis not present

## 2021-12-24 DIAGNOSIS — M9903 Segmental and somatic dysfunction of lumbar region: Secondary | ICD-10-CM | POA: Diagnosis not present

## 2021-12-24 DIAGNOSIS — M7918 Myalgia, other site: Secondary | ICD-10-CM | POA: Diagnosis not present

## 2021-12-24 DIAGNOSIS — M5136 Other intervertebral disc degeneration, lumbar region: Secondary | ICD-10-CM | POA: Diagnosis not present

## 2021-12-25 DIAGNOSIS — M9903 Segmental and somatic dysfunction of lumbar region: Secondary | ICD-10-CM | POA: Diagnosis not present

## 2021-12-25 DIAGNOSIS — M9904 Segmental and somatic dysfunction of sacral region: Secondary | ICD-10-CM | POA: Diagnosis not present

## 2021-12-25 DIAGNOSIS — M7918 Myalgia, other site: Secondary | ICD-10-CM | POA: Diagnosis not present

## 2021-12-25 DIAGNOSIS — M5136 Other intervertebral disc degeneration, lumbar region: Secondary | ICD-10-CM | POA: Diagnosis not present

## 2021-12-25 DIAGNOSIS — M5451 Vertebrogenic low back pain: Secondary | ICD-10-CM | POA: Diagnosis not present

## 2021-12-25 DIAGNOSIS — M5137 Other intervertebral disc degeneration, lumbosacral region: Secondary | ICD-10-CM | POA: Diagnosis not present

## 2021-12-26 DIAGNOSIS — M5451 Vertebrogenic low back pain: Secondary | ICD-10-CM | POA: Diagnosis not present

## 2021-12-26 DIAGNOSIS — M9904 Segmental and somatic dysfunction of sacral region: Secondary | ICD-10-CM | POA: Diagnosis not present

## 2021-12-26 DIAGNOSIS — M7918 Myalgia, other site: Secondary | ICD-10-CM | POA: Diagnosis not present

## 2021-12-26 DIAGNOSIS — M5136 Other intervertebral disc degeneration, lumbar region: Secondary | ICD-10-CM | POA: Diagnosis not present

## 2021-12-26 DIAGNOSIS — M9903 Segmental and somatic dysfunction of lumbar region: Secondary | ICD-10-CM | POA: Diagnosis not present

## 2021-12-31 DIAGNOSIS — M5137 Other intervertebral disc degeneration, lumbosacral region: Secondary | ICD-10-CM | POA: Diagnosis not present

## 2021-12-31 DIAGNOSIS — M9904 Segmental and somatic dysfunction of sacral region: Secondary | ICD-10-CM | POA: Diagnosis not present

## 2021-12-31 DIAGNOSIS — M9903 Segmental and somatic dysfunction of lumbar region: Secondary | ICD-10-CM | POA: Diagnosis not present

## 2021-12-31 DIAGNOSIS — M5136 Other intervertebral disc degeneration, lumbar region: Secondary | ICD-10-CM | POA: Diagnosis not present

## 2021-12-31 DIAGNOSIS — M5451 Vertebrogenic low back pain: Secondary | ICD-10-CM | POA: Diagnosis not present

## 2021-12-31 DIAGNOSIS — M7918 Myalgia, other site: Secondary | ICD-10-CM | POA: Diagnosis not present

## 2022-01-01 DIAGNOSIS — M9903 Segmental and somatic dysfunction of lumbar region: Secondary | ICD-10-CM | POA: Diagnosis not present

## 2022-01-01 DIAGNOSIS — M5451 Vertebrogenic low back pain: Secondary | ICD-10-CM | POA: Diagnosis not present

## 2022-01-01 DIAGNOSIS — M9904 Segmental and somatic dysfunction of sacral region: Secondary | ICD-10-CM | POA: Diagnosis not present

## 2022-01-01 DIAGNOSIS — M7918 Myalgia, other site: Secondary | ICD-10-CM | POA: Diagnosis not present

## 2022-01-01 DIAGNOSIS — M5137 Other intervertebral disc degeneration, lumbosacral region: Secondary | ICD-10-CM | POA: Diagnosis not present

## 2022-01-01 DIAGNOSIS — M5136 Other intervertebral disc degeneration, lumbar region: Secondary | ICD-10-CM | POA: Diagnosis not present

## 2022-01-02 DIAGNOSIS — M9904 Segmental and somatic dysfunction of sacral region: Secondary | ICD-10-CM | POA: Diagnosis not present

## 2022-01-02 DIAGNOSIS — M5136 Other intervertebral disc degeneration, lumbar region: Secondary | ICD-10-CM | POA: Diagnosis not present

## 2022-01-02 DIAGNOSIS — M7918 Myalgia, other site: Secondary | ICD-10-CM | POA: Diagnosis not present

## 2022-01-02 DIAGNOSIS — M5137 Other intervertebral disc degeneration, lumbosacral region: Secondary | ICD-10-CM | POA: Diagnosis not present

## 2022-01-02 DIAGNOSIS — M5451 Vertebrogenic low back pain: Secondary | ICD-10-CM | POA: Diagnosis not present

## 2022-01-02 DIAGNOSIS — M9903 Segmental and somatic dysfunction of lumbar region: Secondary | ICD-10-CM | POA: Diagnosis not present

## 2022-01-07 DIAGNOSIS — M5137 Other intervertebral disc degeneration, lumbosacral region: Secondary | ICD-10-CM | POA: Diagnosis not present

## 2022-01-07 DIAGNOSIS — M9904 Segmental and somatic dysfunction of sacral region: Secondary | ICD-10-CM | POA: Diagnosis not present

## 2022-01-07 DIAGNOSIS — M7918 Myalgia, other site: Secondary | ICD-10-CM | POA: Diagnosis not present

## 2022-01-07 DIAGNOSIS — M9903 Segmental and somatic dysfunction of lumbar region: Secondary | ICD-10-CM | POA: Diagnosis not present

## 2022-01-07 DIAGNOSIS — M5136 Other intervertebral disc degeneration, lumbar region: Secondary | ICD-10-CM | POA: Diagnosis not present

## 2022-01-07 DIAGNOSIS — M5451 Vertebrogenic low back pain: Secondary | ICD-10-CM | POA: Diagnosis not present

## 2022-01-08 DIAGNOSIS — M5136 Other intervertebral disc degeneration, lumbar region: Secondary | ICD-10-CM | POA: Diagnosis not present

## 2022-01-08 DIAGNOSIS — M7918 Myalgia, other site: Secondary | ICD-10-CM | POA: Diagnosis not present

## 2022-01-08 DIAGNOSIS — M9904 Segmental and somatic dysfunction of sacral region: Secondary | ICD-10-CM | POA: Diagnosis not present

## 2022-01-08 DIAGNOSIS — M5137 Other intervertebral disc degeneration, lumbosacral region: Secondary | ICD-10-CM | POA: Diagnosis not present

## 2022-01-08 DIAGNOSIS — M9903 Segmental and somatic dysfunction of lumbar region: Secondary | ICD-10-CM | POA: Diagnosis not present

## 2022-01-08 DIAGNOSIS — M5451 Vertebrogenic low back pain: Secondary | ICD-10-CM | POA: Diagnosis not present

## 2022-01-09 DIAGNOSIS — M5137 Other intervertebral disc degeneration, lumbosacral region: Secondary | ICD-10-CM | POA: Diagnosis not present

## 2022-01-09 DIAGNOSIS — N2581 Secondary hyperparathyroidism of renal origin: Secondary | ICD-10-CM | POA: Diagnosis not present

## 2022-01-09 DIAGNOSIS — M9904 Segmental and somatic dysfunction of sacral region: Secondary | ICD-10-CM | POA: Diagnosis not present

## 2022-01-09 DIAGNOSIS — I10 Essential (primary) hypertension: Secondary | ICD-10-CM | POA: Diagnosis not present

## 2022-01-09 DIAGNOSIS — M7918 Myalgia, other site: Secondary | ICD-10-CM | POA: Diagnosis not present

## 2022-01-09 DIAGNOSIS — N184 Chronic kidney disease, stage 4 (severe): Secondary | ICD-10-CM | POA: Diagnosis not present

## 2022-01-09 DIAGNOSIS — M9903 Segmental and somatic dysfunction of lumbar region: Secondary | ICD-10-CM | POA: Diagnosis not present

## 2022-01-09 DIAGNOSIS — M5136 Other intervertebral disc degeneration, lumbar region: Secondary | ICD-10-CM | POA: Diagnosis not present

## 2022-01-09 DIAGNOSIS — R600 Localized edema: Secondary | ICD-10-CM | POA: Diagnosis not present

## 2022-01-09 DIAGNOSIS — M5451 Vertebrogenic low back pain: Secondary | ICD-10-CM | POA: Diagnosis not present

## 2022-01-13 DIAGNOSIS — N184 Chronic kidney disease, stage 4 (severe): Secondary | ICD-10-CM | POA: Diagnosis not present

## 2022-01-13 DIAGNOSIS — I129 Hypertensive chronic kidney disease with stage 1 through stage 4 chronic kidney disease, or unspecified chronic kidney disease: Secondary | ICD-10-CM | POA: Diagnosis not present

## 2022-01-13 DIAGNOSIS — N2581 Secondary hyperparathyroidism of renal origin: Secondary | ICD-10-CM | POA: Diagnosis not present

## 2022-01-13 DIAGNOSIS — R600 Localized edema: Secondary | ICD-10-CM | POA: Diagnosis not present

## 2022-01-14 DIAGNOSIS — M9904 Segmental and somatic dysfunction of sacral region: Secondary | ICD-10-CM | POA: Diagnosis not present

## 2022-01-14 DIAGNOSIS — M7918 Myalgia, other site: Secondary | ICD-10-CM | POA: Diagnosis not present

## 2022-01-14 DIAGNOSIS — M5451 Vertebrogenic low back pain: Secondary | ICD-10-CM | POA: Diagnosis not present

## 2022-01-14 DIAGNOSIS — M5137 Other intervertebral disc degeneration, lumbosacral region: Secondary | ICD-10-CM | POA: Diagnosis not present

## 2022-01-14 DIAGNOSIS — M5136 Other intervertebral disc degeneration, lumbar region: Secondary | ICD-10-CM | POA: Diagnosis not present

## 2022-01-14 DIAGNOSIS — M9903 Segmental and somatic dysfunction of lumbar region: Secondary | ICD-10-CM | POA: Diagnosis not present

## 2022-01-15 DIAGNOSIS — M5451 Vertebrogenic low back pain: Secondary | ICD-10-CM | POA: Diagnosis not present

## 2022-01-15 DIAGNOSIS — M5136 Other intervertebral disc degeneration, lumbar region: Secondary | ICD-10-CM | POA: Diagnosis not present

## 2022-01-15 DIAGNOSIS — M9903 Segmental and somatic dysfunction of lumbar region: Secondary | ICD-10-CM | POA: Diagnosis not present

## 2022-01-15 DIAGNOSIS — M9904 Segmental and somatic dysfunction of sacral region: Secondary | ICD-10-CM | POA: Diagnosis not present

## 2022-01-15 DIAGNOSIS — M7918 Myalgia, other site: Secondary | ICD-10-CM | POA: Diagnosis not present

## 2022-01-15 DIAGNOSIS — M5137 Other intervertebral disc degeneration, lumbosacral region: Secondary | ICD-10-CM | POA: Diagnosis not present

## 2022-01-16 DIAGNOSIS — M5451 Vertebrogenic low back pain: Secondary | ICD-10-CM | POA: Diagnosis not present

## 2022-01-16 DIAGNOSIS — M9904 Segmental and somatic dysfunction of sacral region: Secondary | ICD-10-CM | POA: Diagnosis not present

## 2022-01-16 DIAGNOSIS — M5136 Other intervertebral disc degeneration, lumbar region: Secondary | ICD-10-CM | POA: Diagnosis not present

## 2022-01-16 DIAGNOSIS — M7918 Myalgia, other site: Secondary | ICD-10-CM | POA: Diagnosis not present

## 2022-01-16 DIAGNOSIS — M5137 Other intervertebral disc degeneration, lumbosacral region: Secondary | ICD-10-CM | POA: Diagnosis not present

## 2022-01-16 DIAGNOSIS — M9903 Segmental and somatic dysfunction of lumbar region: Secondary | ICD-10-CM | POA: Diagnosis not present

## 2022-01-21 DIAGNOSIS — M5136 Other intervertebral disc degeneration, lumbar region: Secondary | ICD-10-CM | POA: Diagnosis not present

## 2022-01-21 DIAGNOSIS — M9903 Segmental and somatic dysfunction of lumbar region: Secondary | ICD-10-CM | POA: Diagnosis not present

## 2022-01-21 DIAGNOSIS — M7918 Myalgia, other site: Secondary | ICD-10-CM | POA: Diagnosis not present

## 2022-01-21 DIAGNOSIS — M5451 Vertebrogenic low back pain: Secondary | ICD-10-CM | POA: Diagnosis not present

## 2022-01-21 DIAGNOSIS — M9904 Segmental and somatic dysfunction of sacral region: Secondary | ICD-10-CM | POA: Diagnosis not present

## 2022-01-21 DIAGNOSIS — M5137 Other intervertebral disc degeneration, lumbosacral region: Secondary | ICD-10-CM | POA: Diagnosis not present

## 2022-01-22 DIAGNOSIS — M7918 Myalgia, other site: Secondary | ICD-10-CM | POA: Diagnosis not present

## 2022-01-22 DIAGNOSIS — M9903 Segmental and somatic dysfunction of lumbar region: Secondary | ICD-10-CM | POA: Diagnosis not present

## 2022-01-22 DIAGNOSIS — M5451 Vertebrogenic low back pain: Secondary | ICD-10-CM | POA: Diagnosis not present

## 2022-01-22 DIAGNOSIS — M5137 Other intervertebral disc degeneration, lumbosacral region: Secondary | ICD-10-CM | POA: Diagnosis not present

## 2022-01-22 DIAGNOSIS — M5136 Other intervertebral disc degeneration, lumbar region: Secondary | ICD-10-CM | POA: Diagnosis not present

## 2022-01-22 DIAGNOSIS — M9904 Segmental and somatic dysfunction of sacral region: Secondary | ICD-10-CM | POA: Diagnosis not present

## 2022-01-28 DIAGNOSIS — M5136 Other intervertebral disc degeneration, lumbar region: Secondary | ICD-10-CM | POA: Diagnosis not present

## 2022-01-28 DIAGNOSIS — M7918 Myalgia, other site: Secondary | ICD-10-CM | POA: Diagnosis not present

## 2022-01-28 DIAGNOSIS — M9904 Segmental and somatic dysfunction of sacral region: Secondary | ICD-10-CM | POA: Diagnosis not present

## 2022-01-28 DIAGNOSIS — M5137 Other intervertebral disc degeneration, lumbosacral region: Secondary | ICD-10-CM | POA: Diagnosis not present

## 2022-01-28 DIAGNOSIS — M5451 Vertebrogenic low back pain: Secondary | ICD-10-CM | POA: Diagnosis not present

## 2022-01-28 DIAGNOSIS — M9903 Segmental and somatic dysfunction of lumbar region: Secondary | ICD-10-CM | POA: Diagnosis not present

## 2022-01-29 DIAGNOSIS — M5451 Vertebrogenic low back pain: Secondary | ICD-10-CM | POA: Diagnosis not present

## 2022-01-29 DIAGNOSIS — M9903 Segmental and somatic dysfunction of lumbar region: Secondary | ICD-10-CM | POA: Diagnosis not present

## 2022-01-29 DIAGNOSIS — M5137 Other intervertebral disc degeneration, lumbosacral region: Secondary | ICD-10-CM | POA: Diagnosis not present

## 2022-01-29 DIAGNOSIS — M7918 Myalgia, other site: Secondary | ICD-10-CM | POA: Diagnosis not present

## 2022-01-29 DIAGNOSIS — M9904 Segmental and somatic dysfunction of sacral region: Secondary | ICD-10-CM | POA: Diagnosis not present

## 2022-01-29 DIAGNOSIS — M5136 Other intervertebral disc degeneration, lumbar region: Secondary | ICD-10-CM | POA: Diagnosis not present

## 2022-02-04 DIAGNOSIS — M5136 Other intervertebral disc degeneration, lumbar region: Secondary | ICD-10-CM | POA: Diagnosis not present

## 2022-02-04 DIAGNOSIS — M9903 Segmental and somatic dysfunction of lumbar region: Secondary | ICD-10-CM | POA: Diagnosis not present

## 2022-02-04 DIAGNOSIS — M9904 Segmental and somatic dysfunction of sacral region: Secondary | ICD-10-CM | POA: Diagnosis not present

## 2022-02-04 DIAGNOSIS — M5451 Vertebrogenic low back pain: Secondary | ICD-10-CM | POA: Diagnosis not present

## 2022-02-04 DIAGNOSIS — M5137 Other intervertebral disc degeneration, lumbosacral region: Secondary | ICD-10-CM | POA: Diagnosis not present

## 2022-02-04 DIAGNOSIS — M7918 Myalgia, other site: Secondary | ICD-10-CM | POA: Diagnosis not present

## 2022-02-05 DIAGNOSIS — M9904 Segmental and somatic dysfunction of sacral region: Secondary | ICD-10-CM | POA: Diagnosis not present

## 2022-02-05 DIAGNOSIS — M7918 Myalgia, other site: Secondary | ICD-10-CM | POA: Diagnosis not present

## 2022-02-05 DIAGNOSIS — M5451 Vertebrogenic low back pain: Secondary | ICD-10-CM | POA: Diagnosis not present

## 2022-02-05 DIAGNOSIS — M5137 Other intervertebral disc degeneration, lumbosacral region: Secondary | ICD-10-CM | POA: Diagnosis not present

## 2022-02-05 DIAGNOSIS — M5136 Other intervertebral disc degeneration, lumbar region: Secondary | ICD-10-CM | POA: Diagnosis not present

## 2022-02-05 DIAGNOSIS — M9903 Segmental and somatic dysfunction of lumbar region: Secondary | ICD-10-CM | POA: Diagnosis not present

## 2022-02-12 DIAGNOSIS — M5136 Other intervertebral disc degeneration, lumbar region: Secondary | ICD-10-CM | POA: Diagnosis not present

## 2022-02-12 DIAGNOSIS — M7918 Myalgia, other site: Secondary | ICD-10-CM | POA: Diagnosis not present

## 2022-02-12 DIAGNOSIS — M5451 Vertebrogenic low back pain: Secondary | ICD-10-CM | POA: Diagnosis not present

## 2022-02-12 DIAGNOSIS — M5137 Other intervertebral disc degeneration, lumbosacral region: Secondary | ICD-10-CM | POA: Diagnosis not present

## 2022-02-12 DIAGNOSIS — M9904 Segmental and somatic dysfunction of sacral region: Secondary | ICD-10-CM | POA: Diagnosis not present

## 2022-02-12 DIAGNOSIS — M9903 Segmental and somatic dysfunction of lumbar region: Secondary | ICD-10-CM | POA: Diagnosis not present

## 2022-02-13 DIAGNOSIS — M5136 Other intervertebral disc degeneration, lumbar region: Secondary | ICD-10-CM | POA: Diagnosis not present

## 2022-02-13 DIAGNOSIS — M5137 Other intervertebral disc degeneration, lumbosacral region: Secondary | ICD-10-CM | POA: Diagnosis not present

## 2022-02-13 DIAGNOSIS — M5451 Vertebrogenic low back pain: Secondary | ICD-10-CM | POA: Diagnosis not present

## 2022-02-13 DIAGNOSIS — M9904 Segmental and somatic dysfunction of sacral region: Secondary | ICD-10-CM | POA: Diagnosis not present

## 2022-02-13 DIAGNOSIS — M9903 Segmental and somatic dysfunction of lumbar region: Secondary | ICD-10-CM | POA: Diagnosis not present

## 2022-02-13 DIAGNOSIS — M7918 Myalgia, other site: Secondary | ICD-10-CM | POA: Diagnosis not present

## 2022-03-13 DIAGNOSIS — M9904 Segmental and somatic dysfunction of sacral region: Secondary | ICD-10-CM | POA: Diagnosis not present

## 2022-03-13 DIAGNOSIS — M5451 Vertebrogenic low back pain: Secondary | ICD-10-CM | POA: Diagnosis not present

## 2022-03-13 DIAGNOSIS — M7918 Myalgia, other site: Secondary | ICD-10-CM | POA: Diagnosis not present

## 2022-03-13 DIAGNOSIS — M5136 Other intervertebral disc degeneration, lumbar region: Secondary | ICD-10-CM | POA: Diagnosis not present

## 2022-03-13 DIAGNOSIS — M9903 Segmental and somatic dysfunction of lumbar region: Secondary | ICD-10-CM | POA: Diagnosis not present

## 2022-03-13 DIAGNOSIS — M5137 Other intervertebral disc degeneration, lumbosacral region: Secondary | ICD-10-CM | POA: Diagnosis not present

## 2022-03-25 DIAGNOSIS — M7918 Myalgia, other site: Secondary | ICD-10-CM | POA: Diagnosis not present

## 2022-03-25 DIAGNOSIS — M5451 Vertebrogenic low back pain: Secondary | ICD-10-CM | POA: Diagnosis not present

## 2022-03-25 DIAGNOSIS — M9904 Segmental and somatic dysfunction of sacral region: Secondary | ICD-10-CM | POA: Diagnosis not present

## 2022-03-25 DIAGNOSIS — M9903 Segmental and somatic dysfunction of lumbar region: Secondary | ICD-10-CM | POA: Diagnosis not present

## 2022-03-25 DIAGNOSIS — M5136 Other intervertebral disc degeneration, lumbar region: Secondary | ICD-10-CM | POA: Diagnosis not present

## 2022-03-25 DIAGNOSIS — M5137 Other intervertebral disc degeneration, lumbosacral region: Secondary | ICD-10-CM | POA: Diagnosis not present

## 2022-03-26 ENCOUNTER — Ambulatory Visit (INDEPENDENT_AMBULATORY_CARE_PROVIDER_SITE_OTHER): Payer: Medicare HMO

## 2022-03-26 DIAGNOSIS — Z0000 Encounter for general adult medical examination without abnormal findings: Secondary | ICD-10-CM | POA: Diagnosis not present

## 2022-03-26 NOTE — Progress Notes (Signed)
I connected with  [name] on 03/26/22 by a audio enabled telemedicine application and verified that I am speaking with the correct person using two identifiers.  Patient Location: Home  Provider Location: Office/Clinic  I discussed the limitations of evaluation and management by telemedicine. The patient expressed understanding and agreed to proceed.   Subjective:   Carly Marshall is a 86 y.o. female who presents for Medicare Annual (Subsequent) preventive examination.  Review of Systems    Per HPI unless specifically indicated below Cardiac Risk Factors include: advanced age (>63mn, >>78women);female gender, hypertension and hyperlipidemia.           Objective:    Today's Vitals   03/26/22 1348  PainSc: 9    There is no height or weight on file to calculate BMI.     03/25/2021    1:37 PM 03/21/2020    1:37 PM 03/14/2019    1:38 PM 02/08/2019    3:32 AM 02/07/2019    7:08 PM 03/10/2018    1:39 PM 03/02/2017    3:54 PM  Advanced Directives  Does Patient Have a Medical Advance Directive? Yes Yes Yes No No No No  Type of [place] will [place] will [place] will      Copy of [place] Chart? Yes - validated most recent copy scanned in chart (See row information) Yes - validated most recent copy scanned in chart (See row information) No - copy requested      Would patient like information on creating a medical advance directive?    No - Patient declined No - Patient declined Yes (MAU/Ambulatory/Procedural Areas - Information given) Yes (MAU/Ambulatory/Procedural Areas - Information given)    Current Medications (verified) Outpatient Encounter Medications as of 03/26/2022  Medication Sig   amLODipine (NORVASC) 5 MG tablet Take 1 tablet (5 mg total) by mouth 2 (two) times daily.   Calcium Carb-Cholecalciferol (CALCIUM 600 + D PO) Take 1 capsule by mouth  2 (two) times daily.   clopidogrel (PLAVIX) 75 MG tablet Take 1 tablet (75 mg total) by mouth daily.   cyanocobalamin (,VITAMIN B-12,) 1000 MCG/ML injection INJECT 1ML ONCE A MONTH   dapagliflozin propanediol (FARXIGA) 10 MG TABS tablet Take 10 mg by mouth daily. singh   fluticasone (FLONASE) 50 MCG/ACT nasal spray Place 2 sprays into both nostrils daily.   hydrochlorothiazide (HYDRODIURIL) 12.5 MG tablet Take 1 tablet (12.5 mg total) by mouth daily.   levothyroxine (SYNTHROID) 50 MCG tablet Take 1 tablet (50 mcg total) by mouth daily.   losartan (COZAAR) 50 MG tablet Take 1 tablet (50 mg total) by mouth daily.   Multiple Vitamins-Minerals (OCUVITE ADULT 50+ PO) Take 1 tablet by mouth daily.   simvastatin (ZOCOR) 20 MG tablet Take 1 tablet (20 mg total) by mouth daily.   vitamin C (ASCORBIC ACID) 500 MG tablet Take 500 mg by mouth daily.   [DISCONTINUED] ciprofloxacin (CILOXAN) 0.3 % ophthalmic solution Place 1 drop into both eyes every 2 (two) hours. Administer 1 drop, every 2 hours, while awake, for 2 days. Then 1 drop, every 4 hours, while awake, for the next 5 days. (Patient not taking: Reported on 03/26/2022)   [DISCONTINUED] OVER THE COUNTER MEDICATION Take by mouth. Take 1 tablet by mouth daily. "Doterra Deep Blue" From the website - Deep Blue Polyphenol Complex delivers polyphenol extracts of frankincense, turmeric, green tea, ginger, pomegranate, and grape seed, and is  designed to provide soothing support to aching muscles and to other occasional discomfort (Patient not taking: Reported on 03/26/2022)   No facility-administered encounter medications on file as of 03/26/2022.    Allergies (verified) Patient has no known allergies.   History: Past Medical History:  Diagnosis Date   Allergy    B12 deficiency anemia    Hyperlipidemia    Hypertension    Stroke [facility])    Thyroid disease    Past Surgical History:  Procedure Laterality Date   COLONOSCOPY  2012   normal- Dr Sonny Masters    ECTOPIC PREGNANCY SURGERY     Family History  Problem Relation Age of Onset   Diabetes Mother    Heart disease Mother    Fibromyalgia Mother    Heart disease Father    Hyperlipidemia Father    Hypertension Father    Diabetes Sister    Brain cancer Brother    Rheum arthritis Sister    Social History   Socioeconomic History   Marital status: Widowed    Spouse name: Not on file   Number of children: 2   Years of education: Not on file   Highest education level: 12th grade  Occupational History   Occupation: Retired   Occupation: Programmer, applications: [employer]  Tobacco Use   Smoking status: Never   Smokeless tobacco: Never   Tobacco comments:    smoking cessation materials not required  Vaping Use   Vaping Use: Never used  Substance and Sexual Activity   Alcohol use: No    Alcohol/week: 0.0 standard drinks of alcohol   Drug use: No   Sexual activity: Never  Other Topics Concern   Not on file  Social History Narrative   Husband passed away May 31, 2019   Social Determinants of Health   Financial Resource Strain: Low Risk  (03/26/2022)   Overall Financial Resource Strain (CARDIA)    Difficulty of Paying Living Expenses: Not hard at all  Food Insecurity: No Food Insecurity (03/26/2022)   Hunger Vital Sign    Worried About Running Out of Food in the Last Year: Never true    Ran Out of Food in the Last Year: Never true  Transportation Needs: No Transportation Needs (03/26/2022)   PRAPARE - Hydrologist (Medical): No    Lack of Transportation (Non-Medical): No  Physical Activity: Sufficiently Active (03/26/2022)   Exercise Vital Sign    Days of Exercise per Week: 7 days    Minutes of Exercise per Session: 30 min  Stress: No Stress Concern Present (03/26/2022)   [place]    Feeling of Stress : Not at all  Social Connections: Moderately Integrated (03/26/2022)    Social Connection and Isolation Panel [NHANES]    Frequency of Communication with Friends and Family: More than three times a week    Frequency of Social Gatherings with Friends and Family: More than three times a week    Attends Religious Services: More than 4 times per year    Active Member of [employer] or Organizations: Yes    Attends Archivist Meetings: More than 4 times per year    Marital Status: Widowed    Tobacco Counseling Counseling given: Not Answered Tobacco comments: smoking cessation materials not required   Clinical Intake:  Pre-visit preparation completed: No  Pain : 0-10 Pain Score: 9  Pain Type: Chronic pain Pain Location: Back Pain Orientation: Lower  Pain Descriptors / Indicators: Aching Pain Frequency: Intermittent     Nutritional Status: BMI of 19-24  Normal Nutritional Risks: None Diabetes: No     Diabetic?no  Interpreter Needed?: No  Information entered by :: Donnie Mesa, CMA   Activities of Daily Living    03/26/2022    1:46 PM 12/10/2021   10:54 AM  In your present state of health, do you have any difficulty performing the following activities:  Hearing? 0 0  Vision? 1 0  Comment cataract surgery 2 yrs ago and now only have to wear glasses to read   Difficulty concentrating or making decisions? 0 0  Walking or climbing stairs? 1 1  Comment balance issues   Dressing or bathing? 0 0  Doing errands, shopping? 0 0    Patient Care Team: Juline Patch, MD as PCP - General (Family Medicine) Magnus Sinning, MD as Consulting Physician (Nephrology)  Indicate any recent Medical Services you may have received from other than [facility] providers in the past year (date may be approximate). No hospitalization in the past 12 months.    Assessment:   This is a routine wellness examination for [place].  Hearing/Vision screen Denies any hearing issues. Denies any vision issues since her cataract surgery x 3 years ago. Annual Eye  Exam [facility].  Dietary issues and exercise activities discussed: Current Exercise Habits: Home exercise routine;Structured exercise class, Type of exercise: walking, Time (Minutes): 30, Frequency (Times/Week): 7, Weekly Exercise (Minutes/Week): 210, Intensity: Moderate Pt reports eating out mostly because she lives only. She state she eat mostly salads when she eat out and occasionally grill chicken.    Goals Addressed             This Visit's Progress    Stay Active and Independent-Low Back Pain           Why is this important?   Regular activity or exercise is important to managing back pain.  Activity helps to keep your muscles strong.  You will sleep better and feel more relaxed.  You will have more energy and feel less stressed.  If you are not active now, start slowly. Little changes make a big difference.  Rest, but not too much.  Stay as active as you can and listen to your body's signals.          Depression Screen    03/26/2022    1:44 PM 12/10/2021   10:53 AM 03/25/2021    1:36 PM 12/10/2020    1:31 PM 06/19/2020    1:50 PM 03/23/2020    3:06 PM 03/21/2020    1:36 PM  PHQ 2/9 Scores  PHQ - 2 Score 0 0 0 0 0 0 0  PHQ- 9 Score  3  0 0 0     Fall Risk    03/26/2022    1:46 PM 12/10/2021   10:54 AM 03/25/2021    1:37 PM 06/19/2020    1:49 PM 03/23/2020    3:06 PM  [place] in the past year? 0 0 1 0 0  Number falls in past yr: 0 0 1    Injury with Fall? 0 0 0    Risk for fall due to : No Fall Risks No Fall Risks History of fall(s)    Follow up Falls evaluation completed Falls evaluation completed Falls prevention discussed Falls evaluation completed Falls evaluation completed    [place]:  Any stairs in or around the home? No  If so, are there any without handrails? No  Home free of loose throw rugs in walkways, pet beds, electrical cords, etc? Yes  Adequate lighting in your home to reduce risk of  falls? Yes   ASSISTIVE DEVICES UTILIZED TO PREVENT FALLS:  Life alert? No  Use of a cane, walker or w/c? Yes  Grab bars in the bathroom? Yes  Shower chair or bench in shower? Yes  Elevated toilet seat or a handicapped toilet? Yes   TIMED UP AND GO:  Was the test performed?  unable to perform, virtual appointment  .    Cognitive Function:        03/26/2022    1:51 PM 03/21/2020    2:36 PM 03/14/2019    1:47 PM 03/10/2018    1:40 PM 03/02/2017    3:57 PM  6CIT Screen  What Year? 0 points 0 points 0 points 0 points 0 points  What month? 0 points 0 points 0 points 0 points 0 points  What time? 0 points 0 points 0 points 0 points 0 points  Count back from 20 0 points 0 points 0 points 0 points 0 points  Months in reverse 0 points 0 points 0 points 0 points 0 points  Repeat phrase 0 points 0 points 0 points 2 points 0 points  Total Score 0 points 0 points 0 points 2 points 0 points    Immunizations Immunization History  Administered Date(s) Administered   Influenza, High Dose Seasonal PF 05/26/2017, 05/04/2018, 05/25/2019, 04/14/2020, 05/25/2021   Influenza,inj,Quad PF,6+ Mos 04/28/2016   Influenza-Unspecified 05/12/2015   PFIZER(Purple Top)SARS-COV-2 Vaccination 10/07/2019, 11/08/2019, 06/05/2020   PNEUMOCOCCAL CONJUGATE-20 06/18/2021   PPD Test 05/29/2015   Pneumococcal Polysaccharide-23 03/27/2015   Tdap 03/27/2015    TDAP status: Up to date  Flu Vaccine status: Up to date  Pneumococcal vaccine status: Up to date  Covid-19 vaccine status: Information provided on how to obtain vaccines.   Qualifies for Shingles Vaccine? Yes   Zostavax completed No   Shingrix Completed?: No.    Education has been provided regarding the importance of this vaccine. Patient has been advised to call insurance company to determine out of pocket expense if they have not yet received this vaccine. Advised may also receive vaccine at local pharmacy or Health Dept. Verbalized acceptance and  understanding.  Screening Tests Health Maintenance  Topic Date Due   Zoster Vaccines- Shingrix (1 of 2) Never done   COVID-19 Vaccine (4 - Pfizer series) 07/31/2020   INFLUENZA VACCINE  03/11/2022   TETANUS/TDAP  03/26/2025   Pneumonia Vaccine 22+ Years old  Completed   DEXA SCAN  Completed   HPV VACCINES  Aged Out    Health Maintenance  Health Maintenance Due  Topic Date Due   Zoster Vaccines- Shingrix (1 of 2) Never done   COVID-19 Vaccine (4 - Pfizer series) 07/31/2020   INFLUENZA VACCINE  03/11/2022    Colorectal cancer screening: No longer required.   Mammogram status: No longer required due to age.  DEXA scan: completed 06/18/2021 Lung Cancer Screening: (Low Dose CT Chest recommended if Age 67-80 years, 30 pack-year currently smoking OR have quit w/in 15years.) does not qualify.   Lung Cancer Screening Referral: does not qualify   Additional Screening:  Hepatitis C Screening: does not qualify   Vision Screening: Recommended annual ophthalmology exams for early detection of glaucoma and other disorders of the eye. Is the patient up to date  with their annual eye exam?  Yes  Who is the provider or what is the name of the office in which the patient attends annual eye exams? [facility]  If pt is not established with a provider, would they like to be referred to a provider to establish care? No .   Dental Screening: Recommended annual dental exams for proper oral hygiene  Community Resource Referral / Chronic Care Management: CRR required this visit?  No   CCM required this visit?  No      Plan:     I have personally reviewed and noted the following in the patient's chart:   Medical and social history Use of alcohol, tobacco or illicit drugs  Current medications and supplements including opioid prescriptions.  Functional ability and status Nutritional status Physical activity Advanced directives List of other physicians Hospitalizations,  surgeries, and ER visits in previous 12 months Vitals Screenings to include cognitive, depression, and falls Referrals and appointments  In addition, I have reviewed and discussed with patient certain preventive protocols, quality metrics, and best practice recommendations. A written personalized care plan for preventive services as well as general preventive health recommendations were provided to patient.    Ms. Ericson , Thank you for taking time to come for your Medicare Wellness Visit. I appreciate your ongoing commitment to your health goals. Please review the following plan we discussed and let me know if I can assist you in the future.   These are the goals we discussed:  Goals      DIET - INCREASE WATER INTAKE     Recommend to drink at least 6-8 8oz glasses of water per day.     Stay Active and Independent-Low Back Pain         Why is this important?   Regular activity or exercise is important to managing back pain.  Activity helps to keep your muscles strong.  You will sleep better and feel more relaxed.  You will have more energy and feel less stressed.  If you are not active now, start slowly. Little changes make a big difference.  Rest, but not too much.  Stay as active as you can and listen to your body's signals.           This is a list of the screening recommended for you and due dates:  Health Maintenance  Topic Date Due   Zoster (Shingles) Vaccine (1 of 2) Never done   COVID-19 Vaccine (4 - Pfizer series) 07/31/2020   Flu Shot  03/11/2022   Tetanus Vaccine  03/26/2025   Pneumonia Vaccine  Completed   DEXA scan (bone density measurement)  Completed   HPV Vaccine  Aged [address] [place], [place]   03/26/2022   Nurse Notes: Approximately 30 minute Non face to face visit

## 2022-03-26 NOTE — Patient Instructions (Signed)

## 2022-04-02 DIAGNOSIS — I129 Hypertensive chronic kidney disease with stage 1 through stage 4 chronic kidney disease, or unspecified chronic kidney disease: Secondary | ICD-10-CM | POA: Diagnosis not present

## 2022-04-02 DIAGNOSIS — Z7902 Long term (current) use of antithrombotics/antiplatelets: Secondary | ICD-10-CM | POA: Diagnosis not present

## 2022-04-02 DIAGNOSIS — Z7984 Long term (current) use of oral hypoglycemic drugs: Secondary | ICD-10-CM | POA: Diagnosis not present

## 2022-04-02 DIAGNOSIS — Z833 Family history of diabetes mellitus: Secondary | ICD-10-CM | POA: Diagnosis not present

## 2022-04-02 DIAGNOSIS — N184 Chronic kidney disease, stage 4 (severe): Secondary | ICD-10-CM | POA: Diagnosis not present

## 2022-04-02 DIAGNOSIS — Z8249 Family history of ischemic heart disease and other diseases of the circulatory system: Secondary | ICD-10-CM | POA: Diagnosis not present

## 2022-04-02 DIAGNOSIS — R32 Unspecified urinary incontinence: Secondary | ICD-10-CM | POA: Diagnosis not present

## 2022-04-02 DIAGNOSIS — E785 Hyperlipidemia, unspecified: Secondary | ICD-10-CM | POA: Diagnosis not present

## 2022-04-02 DIAGNOSIS — I739 Peripheral vascular disease, unspecified: Secondary | ICD-10-CM | POA: Diagnosis not present

## 2022-04-02 DIAGNOSIS — E039 Hypothyroidism, unspecified: Secondary | ICD-10-CM | POA: Diagnosis not present

## 2022-04-02 DIAGNOSIS — Z8673 Personal history of transient ischemic attack (TIA), and cerebral infarction without residual deficits: Secondary | ICD-10-CM | POA: Diagnosis not present

## 2022-04-02 DIAGNOSIS — Z008 Encounter for other general examination: Secondary | ICD-10-CM | POA: Diagnosis not present

## 2022-04-10 DIAGNOSIS — M5136 Other intervertebral disc degeneration, lumbar region: Secondary | ICD-10-CM | POA: Diagnosis not present

## 2022-04-10 DIAGNOSIS — M9904 Segmental and somatic dysfunction of sacral region: Secondary | ICD-10-CM | POA: Diagnosis not present

## 2022-04-10 DIAGNOSIS — M7918 Myalgia, other site: Secondary | ICD-10-CM | POA: Diagnosis not present

## 2022-04-10 DIAGNOSIS — M5137 Other intervertebral disc degeneration, lumbosacral region: Secondary | ICD-10-CM | POA: Diagnosis not present

## 2022-04-10 DIAGNOSIS — M9903 Segmental and somatic dysfunction of lumbar region: Secondary | ICD-10-CM | POA: Diagnosis not present

## 2022-04-10 DIAGNOSIS — M5451 Vertebrogenic low back pain: Secondary | ICD-10-CM | POA: Diagnosis not present

## 2022-04-21 ENCOUNTER — Encounter: Payer: Self-pay | Admitting: Family Medicine

## 2022-04-21 ENCOUNTER — Ambulatory Visit (INDEPENDENT_AMBULATORY_CARE_PROVIDER_SITE_OTHER): Payer: Medicare HMO | Admitting: Family Medicine

## 2022-04-21 VITALS — BP 120/80 | HR 60 | Ht 62.0 in | Wt 137.0 lb

## 2022-04-21 DIAGNOSIS — I679 Cerebrovascular disease, unspecified: Secondary | ICD-10-CM

## 2022-04-21 DIAGNOSIS — E538 Deficiency of other specified B group vitamins: Secondary | ICD-10-CM | POA: Diagnosis not present

## 2022-04-21 DIAGNOSIS — R7303 Prediabetes: Secondary | ICD-10-CM

## 2022-04-21 DIAGNOSIS — I10 Essential (primary) hypertension: Secondary | ICD-10-CM

## 2022-04-21 DIAGNOSIS — E782 Mixed hyperlipidemia: Secondary | ICD-10-CM | POA: Diagnosis not present

## 2022-04-21 DIAGNOSIS — E039 Hypothyroidism, unspecified: Secondary | ICD-10-CM | POA: Diagnosis not present

## 2022-04-21 DIAGNOSIS — Z23 Encounter for immunization: Secondary | ICD-10-CM | POA: Diagnosis not present

## 2022-04-21 MED ORDER — LOSARTAN POTASSIUM 50 MG PO TABS: 50.0000 mg | ORAL_TABLET | Freq: Every day | ORAL | 1 refills | Status: DC

## 2022-04-21 MED ORDER — HYDROCHLOROTHIAZIDE 12.5 MG PO TABS: 12.5000 mg | ORAL_TABLET | Freq: Every day | ORAL | 1 refills | Status: DC

## 2022-04-21 MED ORDER — CYANOCOBALAMIN 1000 MCG/ML IJ SOLN: INTRAMUSCULAR | 1 refills | Status: DC

## 2022-04-21 MED ORDER — AMLODIPINE BESYLATE 5 MG PO TABS: 5.0000 mg | ORAL_TABLET | Freq: Two times a day (BID) | ORAL | 1 refills | Status: DC

## 2022-04-21 MED ORDER — LEVOTHYROXINE SODIUM 50 MCG PO TABS: 50.0000 ug | ORAL_TABLET | Freq: Every day | ORAL | 1 refills | Status: DC

## 2022-04-21 MED ORDER — SIMVASTATIN 20 MG PO TABS: 20.0000 mg | ORAL_TABLET | Freq: Every day | ORAL | 1 refills | Status: DC

## 2022-04-21 MED ORDER — CLOPIDOGREL BISULFATE 75 MG PO TABS: 75.0000 mg | ORAL_TABLET | Freq: Every day | ORAL | 1 refills | Status: DC

## 2022-04-21 NOTE — Progress Notes (Signed)
Date:  04/21/2022   Name:  Carly Marshall   DOB:  04/20/1936   MRN:  [id number]   Chief Complaint: Hypertension, Hyperlipidemia, Allergic Rhinitis , Hypothyroidism, b12 deficiency, Prediabetes, and Flu Vaccine  Hypertension This is a chronic problem. The current episode started more than 1 year ago. The problem has been gradually improving since onset. The problem is controlled. Pertinent negatives include no anxiety, blurred vision, chest pain, headaches, malaise/fatigue, neck pain, orthopnea, palpitations, peripheral edema, PND, shortness of breath or sweats. There are no associated agents to hypertension. There are no known risk factors for coronary artery disease. Past treatments include calcium channel blockers, diuretics and angiotensin blockers. The current treatment provides moderate improvement. There are no compliance problems.  Hypertensive end-organ damage includes CVA. Identifiable causes of hypertension include a thyroid problem. There is no history of chronic renal disease.  Hyperlipidemia This is a chronic problem. The current episode started more than 1 year ago. The problem is controlled. Recent lipid tests were reviewed and are normal. Exacerbating diseases include hypothyroidism. She has no history of chronic renal disease or diabetes. Pertinent negatives include no chest pain, focal sensory loss, focal weakness, leg pain, myalgias or shortness of breath. Current antihyperlipidemic treatment includes statins. The current treatment provides moderate improvement of lipids. There are no compliance problems.   Thyroid Problem Presents for follow-up visit. Patient reports no anxiety, constipation, diarrhea, dry skin, hair loss, nail problem or palpitations. The symptoms have been stable. Her past medical history is significant for hyperlipidemia. There is no history of diabetes.    Lab Results  Component Value Date   NA 141 06/18/2021   K 4.0 06/18/2021   CO2 23 06/18/2021    GLUCOSE 100 (H) 06/18/2021   BUN 34 (H) 06/18/2021   CREATININE 1.66 (H) 06/18/2021   CALCIUM 10.2 06/18/2021   EGFR 30 (L) 06/18/2021   GFRNONAA 24 (L) 06/19/2020   Lab Results  Component Value Date   CHOL 132 06/18/2021   HDL 64 06/18/2021   LDLCALC 53 06/18/2021   TRIG 74 06/18/2021   CHOLHDL 2.6 02/08/2019   Lab Results  Component Value Date   TSH 1.990 12/10/2021   Lab Results  Component Value Date   HGBA1C 5.9 (H) 12/10/2021   Lab Results  Component Value Date   WBC 7.0 06/18/2021   HGB 12.2 06/18/2021   HCT 37.7 06/18/2021   MCV 90 06/18/2021   PLT 272 06/18/2021   Lab Results  Component Value Date   ALT 13 06/18/2021   AST 22 06/18/2021   ALKPHOS 116 06/18/2021   BILITOT 0.6 06/18/2021   No results found for: "25OHVITD2", "25OHVITD3", "VD25OH"   Review of Systems  Constitutional:  Negative for chills, fever and malaise/fatigue.  HENT:  Negative for drooling, ear discharge, ear pain and sore throat.   Eyes:  Negative for blurred vision.  Respiratory:  Negative for cough, shortness of breath and wheezing.   Cardiovascular:  Negative for chest pain, palpitations, orthopnea, leg swelling and PND.  Gastrointestinal:  Negative for abdominal pain, blood in stool, constipation, diarrhea and nausea.  Endocrine: Negative for polydipsia.  Genitourinary:  Negative for dysuria, frequency, hematuria and urgency.  Musculoskeletal:  Negative for back pain, myalgias and neck pain.  Skin:  Negative for rash.  Allergic/Immunologic: Negative for environmental allergies.  Neurological:  Negative for dizziness, focal weakness and headaches.  Hematological:  Does not bruise/bleed easily.  Psychiatric/Behavioral:  Negative for suicidal ideas. The patient is not nervous/anxious.  Patient Active Problem List   Diagnosis Date Noted   Edema of lower extremity 08/30/2020   Anemia in chronic kidney disease 08/30/2020   Chronic venous insufficiency 05/11/2020   Aphasia  02/07/2019   Chronic kidney disease, stage 4 (severe) ([place]) 02/07/2019   B12 deficiency 04/28/2016   Essential hypertension 03/27/2015   Hypothyroidism 03/27/2015   Hyperlipidemia 03/27/2015   Allergic rhinitis due to pollen 03/27/2015    No Known Allergies  Past Surgical History:  Procedure Laterality Date   COLONOSCOPY  2012   normal- Dr Sonny Masters   ECTOPIC PREGNANCY SURGERY      Social History   Tobacco Use   Smoking status: Never   Smokeless tobacco: Never   Tobacco comments:    smoking cessation materials not required  Vaping Use   Vaping Use: Never used  Substance Use Topics   Alcohol use: No    Alcohol/week: 0.0 standard drinks of alcohol   Drug use: No     Medication list has been reviewed and updated.  Current Meds  Medication Sig   amLODipine (NORVASC) 5 MG tablet Take 1 tablet (5 mg total) by mouth 2 (two) times daily.   Calcium Carb-Cholecalciferol (CALCIUM 600 + D PO) Take 1 capsule by mouth 2 (two) times daily.   Cholecalciferol (VITAMIN D3) 50 MCG (2000 UT) CAPS Take 1 capsule by mouth daily.   clopidogrel (PLAVIX) 75 MG tablet Take 1 tablet (75 mg total) by mouth daily.   cyanocobalamin (,VITAMIN B-12,) 1000 MCG/ML injection INJECT 1ML ONCE A MONTH   dapagliflozin propanediol (FARXIGA) 10 MG TABS tablet Take 10 mg by mouth daily. singh   fluticasone (FLONASE) 50 MCG/ACT nasal spray Place 2 sprays into both nostrils daily.   hydrochlorothiazide (HYDRODIURIL) 12.5 MG tablet Take 1 tablet (12.5 mg total) by mouth daily.   levothyroxine (SYNTHROID) 50 MCG tablet Take 1 tablet (50 mcg total) by mouth daily.   losartan (COZAAR) 50 MG tablet Take 1 tablet (50 mg total) by mouth daily.   Multiple Vitamins-Minerals (OCUVITE ADULT 50+ PO) Take 1 tablet by mouth daily.   simvastatin (ZOCOR) 20 MG tablet Take 1 tablet (20 mg total) by mouth daily.   vitamin C (ASCORBIC ACID) 500 MG tablet Take 500 mg by mouth daily.       12/10/2021   10:53 AM 12/10/2020    1:31  PM 06/19/2020    1:50 PM 03/23/2020    3:07 PM  GAD 7 : Generalized Anxiety Score  Nervous, Anxious, on Edge 0 0 0 0  Control/stop worrying 0 0 0 0  Worry too much - different things 0 0 0 0  Trouble relaxing 0 0 0 0  Restless 0 0 0 0  Easily annoyed or irritable 0 0 0 0  Afraid - awful might happen 0 0 0 0  Total GAD 7 Score 0 0 0 0  Anxiety Difficulty Not difficult at all          03/26/2022    1:44 PM 12/10/2021   10:53 AM 03/25/2021    1:36 PM  Depression screen PHQ 2/9  Decreased Interest 0 0 0  Down, Depressed, Hopeless 0 0 0  PHQ - 2 Score 0 0 0  Altered sleeping  1   Tired, decreased energy  1   Change in appetite  1   Feeling bad or failure about yourself   0   Trouble concentrating  0   Moving slowly or fidgety/restless  0   Suicidal thoughts  0   PHQ-9 Score  3   Difficult doing work/chores  Not difficult at all     BP Readings from Last 3 Encounters:  04/21/22 120/80  12/10/21 130/70  06/18/21 120/88    Physical Exam Vitals and nursing note reviewed. Exam conducted with a chaperone present.  Constitutional:      General: She is not in acute distress.    Appearance: She is not diaphoretic.  HENT:     Head: Normocephalic and atraumatic.     Right Ear: Tympanic membrane and external ear normal.     Left Ear: Tympanic membrane and external ear normal.     Nose: Nose normal. No congestion or rhinorrhea.     Mouth/Throat:     Mouth: Mucous membranes are moist.  Eyes:     General:        Right eye: No discharge.        Left eye: No discharge.     Conjunctiva/sclera: Conjunctivae normal.     Pupils: Pupils are equal, round, and reactive to light.  Neck:     Thyroid: No thyromegaly.     Vascular: No JVD.  Cardiovascular:     Rate and Rhythm: Normal rate and regular rhythm.     Pulses:          Dorsalis pedis pulses are 2+ on the right side and 2+ on the left side.       Posterior tibial pulses are 2+ on the right side and 2+ on the left side.     Heart  sounds: Normal heart sounds, S1 normal and S2 normal. No murmur heard.    No systolic murmur is present.     No diastolic murmur is present.     No friction rub. No gallop.  Pulmonary:     Effort: Pulmonary effort is normal.     Breath sounds: Normal breath sounds. No wheezing, rhonchi or rales.  Chest:     Chest wall: No tenderness.  Abdominal:     General: Bowel sounds are normal.     Palpations: Abdomen is soft. There is no mass.     Tenderness: There is no abdominal tenderness. There is no guarding or rebound.  Musculoskeletal:        General: Normal range of motion.     Cervical back: Normal range of motion and neck supple.  Lymphadenopathy:     Cervical: No cervical adenopathy.  Skin:    General: Skin is warm and dry.  Neurological:     Mental Status: She is alert.     Motor: No weakness.     Wt Readings from Last 3 Encounters:  04/21/22 137 lb (62.1 kg)  12/10/21 142 lb (64.4 kg)  06/18/21 141 lb (64 kg)    BP 120/80   Pulse 60   Ht $R'5\' 2"'wQ$  (1.575 m)   Wt 137 lb (62.1 kg)   BMI 25.06 kg/m   Assessment and Plan: 1. Essential hypertension Chronic.  Controlled.  Stable.  Blood pressure 120/80.  Continue amlodipine 5 mg, hydrochlorothiazide 12.5 mg, and losartan 50 mg once a day.  Check for electrolytes and GFR. - amLODipine (NORVASC) 5 MG tablet; Take 1 tablet (5 mg total) by mouth 2 (two) times daily.  Dispense: 180 tablet; Refill: 1 - hydrochlorothiazide (HYDRODIURIL) 12.5 MG tablet; Take 1 tablet (12.5 mg total) by mouth daily.  Dispense: 90 tablet; Refill: 1 - losartan (COZAAR) 50 MG tablet; Take 1 tablet (50 mg total) by mouth daily.  Dispense: 90 tablet; Refill: 1 - Comprehensive Metabolic Panel (CMET)  2. Cerebral vascular disease Chronic.  Controlled.  Stable.  Patient describes a history of a TIA in the past.  She is on clopidogrel and will continue with 75 mg daily - clopidogrel (PLAVIX) 75 MG tablet; Take 1 tablet (75 mg total) by mouth daily.  Dispense:  90 tablet; Refill: 1  3. B12 deficiency Chronic.  Controlled.  Stable.  Continue vitamin B12 injections 1 mL monthly.  We will check CBC with platelets - cyanocobalamin (VITAMIN B12) 1000 MCG/ML injection; Inject 89ml once a month  Dispense: 3 mL; Refill: 1 - CBC w/Diff/Platelet  4. Hypothyroidism, unspecified type Chronic.  Controlled.  Stable.  Will check thyroid panel with levothyroxine 50 mcg daily. - levothyroxine (SYNTHROID) 50 MCG tablet; Take 1 tablet (50 mcg total) by mouth daily.  Dispense: 90 tablet; Refill: 1 - Thyroid Panel With TSH  5. Mixed hyperlipidemia Chronic.  Controlled.  Stable.  Continue simvastatin 20 mg once a day.  We will check lipid panel for control. - simvastatin (ZOCOR) 20 MG tablet; Take 1 tablet (20 mg total) by mouth daily.  Dispense: 90 tablet; Refill: 1 - Lipid Panel With LDL/HDL Ratio  6. Need for immunization against influenza Discussed and administered. - Flu Vaccine QUAD High Dose(Fluad)  7. Prediabetes Patient with history of prediabetes controlled with diet we will check A1c to current level of control. - HgB A1c     Otilio Miu, MD

## 2022-04-22 LAB — CBC WITH DIFFERENTIAL/PLATELET
Basophils Absolute: 0 10*3/uL (ref 0.0–0.2)
Basos: 0 %
EOS (ABSOLUTE): 0.3 10*3/uL (ref 0.0–0.4)
Eos: 4 %
Hematocrit: 37.1 % (ref 34.0–46.6)
Hemoglobin: 12.3 g/dL (ref 11.1–15.9)
Immature Grans (Abs): 0 10*3/uL (ref 0.0–0.1)
Immature Granulocytes: 0 %
Lymphocytes Absolute: 1.9 10*3/uL (ref 0.7–3.1)
Lymphs: 25 %
MCH: 29.3 pg (ref 26.6–33.0)
MCHC: 33.2 g/dL (ref 31.5–35.7)
MCV: 88 fL (ref 79–97)
Monocytes Absolute: 0.6 10*3/uL (ref 0.1–0.9)
Monocytes: 8 %
Neutrophils Absolute: 4.7 10*3/uL (ref 1.4–7.0)
Neutrophils: 63 %
Platelets: 263 10*3/uL (ref 150–450)
RBC: 4.2 x10E6/uL (ref 3.77–5.28)
RDW: 14.1 % (ref 11.7–15.4)
WBC: 7.6 10*3/uL (ref 3.4–10.8)

## 2022-04-22 LAB — COMPREHENSIVE METABOLIC PANEL
ALT: 9 IU/L (ref 0–32)
AST: 21 IU/L (ref 0–40)
Albumin/Globulin Ratio: 1.9 (ref 1.2–2.2)
Albumin: 4.9 g/dL — ABNORMAL HIGH (ref 3.7–4.7)
Alkaline Phosphatase: 127 IU/L — ABNORMAL HIGH (ref 44–121)
BUN/Creatinine Ratio: 19 (ref 12–28)
BUN: 34 mg/dL — ABNORMAL HIGH (ref 8–27)
Bilirubin Total: 0.6 mg/dL (ref 0.0–1.2)
CO2: 22 mmol/L (ref 20–29)
Calcium: 10.4 mg/dL — ABNORMAL HIGH (ref 8.7–10.3)
Chloride: 100 mmol/L (ref 96–106)
Creatinine, Ser: 1.76 mg/dL — ABNORMAL HIGH (ref 0.57–1.00)
Globulin, Total: 2.6 g/dL (ref 1.5–4.5)
Glucose: 95 mg/dL (ref 70–99)
Potassium: 3.5 mmol/L (ref 3.5–5.2)
Sodium: 140 mmol/L (ref 134–144)
Total Protein: 7.5 g/dL (ref 6.0–8.5)
eGFR: 28 mL/min/{1.73_m2} — ABNORMAL LOW (ref >59)

## 2022-04-22 LAB — LIPID PANEL WITH LDL/HDL RATIO
Cholesterol, Total: 133 mg/dL (ref 100–199)
HDL: 62 mg/dL (ref >39)
LDL Chol Calc (NIH): 55 mg/dL (ref 0–99)
LDL/HDL Ratio: 0.9 ratio (ref 0.0–3.2)
Triglycerides: 80 mg/dL (ref 0–149)
VLDL Cholesterol Cal: 16 mg/dL (ref 5–40)

## 2022-04-22 LAB — THYROID PANEL WITH TSH
Free Thyroxine Index: 2.6 (ref 1.2–4.9)
T3 Uptake Ratio: 32 % (ref 24–39)
T4, Total: 8 ug/dL (ref 4.5–12.0)
TSH: 1.82 u[IU]/mL (ref 0.450–4.500)

## 2022-04-22 LAB — HEMOGLOBIN A1C
Est. average glucose Bld gHb Est-mCnc: 126 mg/dL
Hgb A1c MFr Bld: 6 % — ABNORMAL HIGH (ref 4.8–5.6)

## 2022-05-05 DIAGNOSIS — R600 Localized edema: Secondary | ICD-10-CM | POA: Diagnosis not present

## 2022-05-05 DIAGNOSIS — N2581 Secondary hyperparathyroidism of renal origin: Secondary | ICD-10-CM | POA: Diagnosis not present

## 2022-05-05 DIAGNOSIS — N184 Chronic kidney disease, stage 4 (severe): Secondary | ICD-10-CM | POA: Diagnosis not present

## 2022-05-05 DIAGNOSIS — I129 Hypertensive chronic kidney disease with stage 1 through stage 4 chronic kidney disease, or unspecified chronic kidney disease: Secondary | ICD-10-CM | POA: Diagnosis not present

## 2022-05-08 DIAGNOSIS — M5136 Other intervertebral disc degeneration, lumbar region: Secondary | ICD-10-CM | POA: Diagnosis not present

## 2022-05-08 DIAGNOSIS — M5137 Other intervertebral disc degeneration, lumbosacral region: Secondary | ICD-10-CM | POA: Diagnosis not present

## 2022-05-08 DIAGNOSIS — M7918 Myalgia, other site: Secondary | ICD-10-CM | POA: Diagnosis not present

## 2022-05-08 DIAGNOSIS — M5451 Vertebrogenic low back pain: Secondary | ICD-10-CM | POA: Diagnosis not present

## 2022-05-08 DIAGNOSIS — M9904 Segmental and somatic dysfunction of sacral region: Secondary | ICD-10-CM | POA: Diagnosis not present

## 2022-05-08 DIAGNOSIS — M9903 Segmental and somatic dysfunction of lumbar region: Secondary | ICD-10-CM | POA: Diagnosis not present

## 2022-05-15 DIAGNOSIS — N2581 Secondary hyperparathyroidism of renal origin: Secondary | ICD-10-CM | POA: Diagnosis not present

## 2022-05-15 DIAGNOSIS — N184 Chronic kidney disease, stage 4 (severe): Secondary | ICD-10-CM | POA: Diagnosis not present

## 2022-05-15 DIAGNOSIS — R600 Localized edema: Secondary | ICD-10-CM | POA: Diagnosis not present

## 2022-05-15 DIAGNOSIS — I129 Hypertensive chronic kidney disease with stage 1 through stage 4 chronic kidney disease, or unspecified chronic kidney disease: Secondary | ICD-10-CM | POA: Diagnosis not present

## 2022-06-05 DIAGNOSIS — M7918 Myalgia, other site: Secondary | ICD-10-CM | POA: Diagnosis not present

## 2022-06-05 DIAGNOSIS — M9903 Segmental and somatic dysfunction of lumbar region: Secondary | ICD-10-CM | POA: Diagnosis not present

## 2022-06-05 DIAGNOSIS — M5451 Vertebrogenic low back pain: Secondary | ICD-10-CM | POA: Diagnosis not present

## 2022-06-05 DIAGNOSIS — M5136 Other intervertebral disc degeneration, lumbar region: Secondary | ICD-10-CM | POA: Diagnosis not present

## 2022-06-05 DIAGNOSIS — M9904 Segmental and somatic dysfunction of sacral region: Secondary | ICD-10-CM | POA: Diagnosis not present

## 2022-06-05 DIAGNOSIS — M5137 Other intervertebral disc degeneration, lumbosacral region: Secondary | ICD-10-CM | POA: Diagnosis not present

## 2022-06-12 ENCOUNTER — Ambulatory Visit: Payer: Medicare HMO | Admitting: Family Medicine

## 2022-06-26 DIAGNOSIS — M5451 Vertebrogenic low back pain: Secondary | ICD-10-CM | POA: Diagnosis not present

## 2022-06-26 DIAGNOSIS — M9903 Segmental and somatic dysfunction of lumbar region: Secondary | ICD-10-CM | POA: Diagnosis not present

## 2022-06-26 DIAGNOSIS — M9904 Segmental and somatic dysfunction of sacral region: Secondary | ICD-10-CM | POA: Diagnosis not present

## 2022-06-26 DIAGNOSIS — M5136 Other intervertebral disc degeneration, lumbar region: Secondary | ICD-10-CM | POA: Diagnosis not present

## 2022-06-26 DIAGNOSIS — M7918 Myalgia, other site: Secondary | ICD-10-CM | POA: Diagnosis not present

## 2022-06-26 DIAGNOSIS — M5137 Other intervertebral disc degeneration, lumbosacral region: Secondary | ICD-10-CM | POA: Diagnosis not present

## 2022-07-24 DIAGNOSIS — M5136 Other intervertebral disc degeneration, lumbar region: Secondary | ICD-10-CM | POA: Diagnosis not present

## 2022-07-24 DIAGNOSIS — M9904 Segmental and somatic dysfunction of sacral region: Secondary | ICD-10-CM | POA: Diagnosis not present

## 2022-07-24 DIAGNOSIS — M5451 Vertebrogenic low back pain: Secondary | ICD-10-CM | POA: Diagnosis not present

## 2022-07-24 DIAGNOSIS — M5137 Other intervertebral disc degeneration, lumbosacral region: Secondary | ICD-10-CM | POA: Diagnosis not present

## 2022-07-24 DIAGNOSIS — M7918 Myalgia, other site: Secondary | ICD-10-CM | POA: Diagnosis not present

## 2022-07-24 DIAGNOSIS — M9903 Segmental and somatic dysfunction of lumbar region: Secondary | ICD-10-CM | POA: Diagnosis not present

## 2022-08-21 ENCOUNTER — Ambulatory Visit (INDEPENDENT_AMBULATORY_CARE_PROVIDER_SITE_OTHER): Payer: Medicare HMO | Admitting: Family Medicine

## 2022-08-21 VITALS — BP 120/76 | HR 78 | Ht 62.0 in | Wt 139.0 lb

## 2022-08-21 DIAGNOSIS — R7303 Prediabetes: Secondary | ICD-10-CM

## 2022-08-21 NOTE — Progress Notes (Signed)
Date:  08/21/2022   Name:  Carly Marshall   DOB:  1935-08-17   MRN:  [id number]   Chief Complaint: Prediabetes  Diabetes She presents for her follow-up diabetic visit. She has type 2 diabetes mellitus. Her disease course has been stable. There are no hypoglycemic associated symptoms. Pertinent negatives for hypoglycemia include no dizziness, headaches or nervousness/anxiousness. There are no diabetic associated symptoms. Pertinent negatives for diabetes include no fatigue and no weakness. There are no hypoglycemic complications. Symptoms are stable. There are no diabetic complications. There are no known risk factors for coronary artery disease.    Lab Results  Component Value Date   NA 140 04/21/2022   K 3.5 04/21/2022   CO2 22 04/21/2022   GLUCOSE 95 04/21/2022   BUN 34 (H) 04/21/2022   CREATININE 1.76 (H) 04/21/2022   CALCIUM 10.4 (H) 04/21/2022   EGFR 28 (L) 04/21/2022   GFRNONAA 24 (L) 06/19/2020   Lab Results  Component Value Date   CHOL 133 04/21/2022   HDL 62 04/21/2022   LDLCALC 55 04/21/2022   TRIG 80 04/21/2022   CHOLHDL 2.6 02/08/2019   Lab Results  Component Value Date   TSH 1.820 04/21/2022   Lab Results  Component Value Date   HGBA1C 6.0 (H) 04/21/2022   Lab Results  Component Value Date   WBC 7.6 04/21/2022   HGB 12.3 04/21/2022   HCT 37.1 04/21/2022   MCV 88 04/21/2022   PLT 263 04/21/2022   Lab Results  Component Value Date   ALT 9 04/21/2022   AST 21 04/21/2022   ALKPHOS 127 (H) 04/21/2022   BILITOT 0.6 04/21/2022   No results found for: "25OHVITD2", "25OHVITD3", "VD25OH"   Review of Systems  Constitutional: Negative.  Negative for chills, fatigue, fever and unexpected weight change.  HENT:  Negative for congestion, ear discharge, ear pain, rhinorrhea, sinus pressure, sneezing and sore throat.   Respiratory:  Negative for cough, shortness of breath, wheezing and stridor.   Gastrointestinal:  Negative for abdominal pain, blood in  stool, constipation, diarrhea and nausea.  Genitourinary:  Negative for dysuria, flank pain, frequency, hematuria, urgency and vaginal discharge.  Musculoskeletal:  Negative for arthralgias, back pain and myalgias.  Skin:  Negative for rash.  Neurological:  Negative for dizziness, weakness and headaches.  Hematological:  Negative for adenopathy. Does not bruise/bleed easily.  Psychiatric/Behavioral:  Negative for dysphoric mood. The patient is not nervous/anxious.     Patient Active Problem List   Diagnosis Date Noted   Edema of lower extremity 08/30/2020   Anemia in chronic kidney disease 08/30/2020   Chronic venous insufficiency 05/11/2020   Aphasia 02/07/2019   Chronic kidney disease, stage 4 (severe) ([place]) 02/07/2019   B12 deficiency 04/28/2016   Essential hypertension 03/27/2015   Hypothyroidism 03/27/2015   Hyperlipidemia 03/27/2015   Allergic rhinitis due to pollen 03/27/2015    No Known Allergies  Past Surgical History:  Procedure Laterality Date   COLONOSCOPY  2012   normal- Dr Sonny Masters   ECTOPIC PREGNANCY SURGERY      Social History   Tobacco Use   Smoking status: Never   Smokeless tobacco: Never   Tobacco comments:    smoking cessation materials not required  Vaping Use   Vaping Use: Never used  Substance Use Topics   Alcohol use: No    Alcohol/week: 0.0 standard drinks of alcohol   Drug use: No     Medication list has been reviewed and updated.  Current Meds  Medication Sig   amLODipine (NORVASC) 5 MG tablet Take 1 tablet (5 mg total) by mouth 2 (two) times daily.   Calcium Carb-Cholecalciferol (CALCIUM 600 + D PO) Take 1 capsule by mouth 2 (two) times daily.   Cholecalciferol (VITAMIN D3) 50 MCG (2000 UT) CAPS Take 1 capsule by mouth daily.   clopidogrel (PLAVIX) 75 MG tablet Take 1 tablet (75 mg total) by mouth daily.   cyanocobalamin (VITAMIN B12) 1000 MCG/ML injection Inject 36m once a month   fluticasone (FLONASE) 50 MCG/ACT nasal spray Place 2  sprays into both nostrils daily.   hydrochlorothiazide (HYDRODIURIL) 12.5 MG tablet Take 1 tablet (12.5 mg total) by mouth daily.   levothyroxine (SYNTHROID) 50 MCG tablet Take 1 tablet (50 mcg total) by mouth daily.   losartan (COZAAR) 50 MG tablet Take 1 tablet (50 mg total) by mouth daily.   Multiple Vitamins-Minerals (OCUVITE ADULT 50+ PO) Take 1 tablet by mouth daily.   simvastatin (ZOCOR) 20 MG tablet Take 1 tablet (20 mg total) by mouth daily.   vitamin C (ASCORBIC ACID) 500 MG tablet Take 500 mg by mouth daily.   [DISCONTINUED] dapagliflozin propanediol (FARXIGA) 10 MG TABS tablet Take 10 mg by mouth daily. singh       08/21/2022    2:07 PM 12/10/2021   10:53 AM 12/10/2020    1:31 PM 06/19/2020    1:50 PM  GAD 7 : Generalized Anxiety Score  Nervous, Anxious, on Edge 0 0 0 0  Control/stop worrying 0 0 0 0  Worry too much - different things 0 0 0 0  Trouble relaxing 0 0 0 0  Restless 0 0 0 0  Easily annoyed or irritable 0 0 0 0  Afraid - awful might happen 0 0 0 0  Total GAD 7 Score 0 0 0 0  Anxiety Difficulty Not difficult at all Not difficult at all         08/21/2022    2:06 PM 03/26/2022    1:44 PM 12/10/2021   10:53 AM  Depression screen PHQ 2/9  Decreased Interest 0 0 0  Down, Depressed, Hopeless 0 0 0  PHQ - 2 Score 0 0 0  Altered sleeping 0  1  Tired, decreased energy 0  1  Change in appetite 0  1  Feeling bad or failure about yourself  0  0  Trouble concentrating 0  0  Moving slowly or fidgety/restless 0  0  Suicidal thoughts 0  0  PHQ-9 Score 0  3  Difficult doing work/chores Not difficult at all  Not difficult at all    BP Readings from Last 3 Encounters:  08/21/22 120/76  04/21/22 120/80  12/10/21 130/70    Physical Exam HENT:     Head: Normocephalic.     Right Ear: Tympanic membrane and ear canal normal.     Left Ear: Tympanic membrane and ear canal normal.     Nose: Nose normal.     Mouth/Throat:     Mouth: Mucous membranes are moist.   Cardiovascular:     Rate and Rhythm: Normal rate and regular rhythm.     Heart sounds: No murmur heard.    No friction rub. No gallop.  Pulmonary:     Breath sounds: No wheezing, rhonchi or rales.  Abdominal:     Tenderness: There is no abdominal tenderness.  Neurological:     Mental Status: She is alert.     Wt Readings from Last 3 Encounters:  08/21/22 139 lb (63 kg)  04/21/22 137 lb (62.1 kg)  12/10/21 142 lb (64.4 kg)    BP 120/76   Pulse 78   Ht '5\' 2"'$  (1.575 m)   Wt 139 lb (63 kg)   SpO2 97%   BMI 25.42 kg/m   Assessment and Plan:  1. Prediabetes Chronic.  Controlled.  Stable.  Last A1c 6.0.  Will continue with diet control and check A1c for current status of control. - HgB A1c    Otilio Miu, MD

## 2022-08-22 LAB — HEMOGLOBIN A1C
Est. average glucose Bld gHb Est-mCnc: 117 mg/dL
Hgb A1c MFr Bld: 5.7 % — ABNORMAL HIGH (ref 4.8–5.6)

## 2022-08-25 DIAGNOSIS — M5136 Other intervertebral disc degeneration, lumbar region: Secondary | ICD-10-CM | POA: Diagnosis not present

## 2022-08-25 DIAGNOSIS — M5137 Other intervertebral disc degeneration, lumbosacral region: Secondary | ICD-10-CM | POA: Diagnosis not present

## 2022-08-25 DIAGNOSIS — M9904 Segmental and somatic dysfunction of sacral region: Secondary | ICD-10-CM | POA: Diagnosis not present

## 2022-08-25 DIAGNOSIS — M7918 Myalgia, other site: Secondary | ICD-10-CM | POA: Diagnosis not present

## 2022-08-25 DIAGNOSIS — M5451 Vertebrogenic low back pain: Secondary | ICD-10-CM | POA: Diagnosis not present

## 2022-08-25 DIAGNOSIS — M9903 Segmental and somatic dysfunction of lumbar region: Secondary | ICD-10-CM | POA: Diagnosis not present

## 2022-08-26 DIAGNOSIS — M5135 Other intervertebral disc degeneration, thoracolumbar region: Secondary | ICD-10-CM | POA: Diagnosis not present

## 2022-08-26 DIAGNOSIS — M5451 Vertebrogenic low back pain: Secondary | ICD-10-CM | POA: Diagnosis not present

## 2022-08-26 DIAGNOSIS — M7918 Myalgia, other site: Secondary | ICD-10-CM | POA: Diagnosis not present

## 2022-08-26 DIAGNOSIS — M9904 Segmental and somatic dysfunction of sacral region: Secondary | ICD-10-CM | POA: Diagnosis not present

## 2022-08-26 DIAGNOSIS — M5137 Other intervertebral disc degeneration, lumbosacral region: Secondary | ICD-10-CM | POA: Diagnosis not present

## 2022-08-26 DIAGNOSIS — M9903 Segmental and somatic dysfunction of lumbar region: Secondary | ICD-10-CM | POA: Diagnosis not present

## 2022-08-28 ENCOUNTER — Telehealth: Payer: Self-pay | Admitting: Family Medicine

## 2022-08-28 DIAGNOSIS — D72829 Elevated white blood cell count, unspecified: Secondary | ICD-10-CM | POA: Diagnosis not present

## 2022-08-28 DIAGNOSIS — N189 Chronic kidney disease, unspecified: Secondary | ICD-10-CM | POA: Diagnosis not present

## 2022-08-28 DIAGNOSIS — J189 Pneumonia, unspecified organism: Secondary | ICD-10-CM | POA: Diagnosis not present

## 2022-08-28 DIAGNOSIS — R0602 Shortness of breath: Secondary | ICD-10-CM | POA: Diagnosis not present

## 2022-08-28 DIAGNOSIS — R059 Cough, unspecified: Secondary | ICD-10-CM | POA: Diagnosis not present

## 2022-08-28 LAB — CBC: RBC: 3.99 (ref 3.87–5.11)

## 2022-08-28 LAB — CBC AND DIFFERENTIAL
HCT: 34 — AB (ref 36–46)
Hemoglobin: 11.7 — AB (ref 12.0–16.0)
Platelets: 331 10*3/uL (ref 150–400)
WBC: 14.6

## 2022-08-28 NOTE — Telephone Encounter (Signed)
Copied from [place] [id number]. Topic: General - Other >> Aug 28, 2022  3:29 PM Chapman Fitch wrote: Reason for CRM: Pt would like a call from Baxter Flattery to discuss what's going with her today / please advise

## 2022-09-01 ENCOUNTER — Encounter: Payer: Self-pay | Admitting: Family Medicine

## 2022-09-01 ENCOUNTER — Ambulatory Visit (INDEPENDENT_AMBULATORY_CARE_PROVIDER_SITE_OTHER): Payer: Medicare HMO | Admitting: Family Medicine

## 2022-09-01 VITALS — BP 120/70 | HR 65 | Ht 62.0 in | Wt 136.0 lb

## 2022-09-01 DIAGNOSIS — J189 Pneumonia, unspecified organism: Secondary | ICD-10-CM | POA: Diagnosis not present

## 2022-09-01 NOTE — Progress Notes (Signed)
Date:  09/01/2022   Name:  Carly Marshall   DOB:  07-20-1936   MRN:  [id number]   Chief Complaint: Follow-up (Dx with pneumonia on [place]/28/2022 [place] at beach. Was put on Doxy x 10 days, prednisone and albuterol. Breathing is "better", cough has improved unless talking a lot. Cough is dry- no production. Staying hydrated)  Pneumonia She complains of frequent throat clearing. There is no chest tightness, cough, difficulty breathing, hemoptysis, hoarse voice, shortness of breath, sputum production or wheezing. This is a chronic problem. The current episode started in the past 7 days. The problem has been gradually improving. Pertinent negatives include no chest pain, dyspnea on exertion, fever, myalgias, orthopnea, PND, postnasal drip, sneezing, sore throat or trouble swallowing.    Lab Results  Component Value Date   NA 140 04/21/2022   K 3.5 04/21/2022   CO2 22 04/21/2022   GLUCOSE 95 04/21/2022   BUN 34 (H) 04/21/2022   CREATININE 1.76 (H) 04/21/2022   CALCIUM 10.4 (H) 04/21/2022   EGFR 28 (L) 04/21/2022   GFRNONAA 24 (L) 06/19/2020   Lab Results  Component Value Date   CHOL 133 04/21/2022   HDL 62 04/21/2022   LDLCALC 55 04/21/2022   TRIG 80 04/21/2022   CHOLHDL 2.6 02/08/2019   Lab Results  Component Value Date   TSH 1.820 04/21/2022   Lab Results  Component Value Date   HGBA1C 5.7 (H) 08/21/2022   Lab Results  Component Value Date   WBC 14.6 08/28/2022   HGB 11.7 (A) 08/28/2022   HCT 34 (A) 08/28/2022   MCV 88 04/21/2022   PLT 331 08/28/2022   Lab Results  Component Value Date   ALT 9 04/21/2022   AST 21 04/21/2022   ALKPHOS 127 (H) 04/21/2022   BILITOT 0.6 04/21/2022   No results found for: "25OHVITD2", "25OHVITD3", "VD25OH"   Review of Systems  Constitutional:  Positive for fatigue. Negative for chills, diaphoresis and fever.  HENT:  Negative for hoarse voice, nosebleeds, postnasal drip, sneezing, sore throat and trouble swallowing.   Respiratory:   Negative for cough, hemoptysis, sputum production, chest tightness, shortness of breath and wheezing.   Cardiovascular:  Negative for chest pain, dyspnea on exertion, palpitations and PND.  Musculoskeletal:  Negative for myalgias.    Patient Active Problem List   Diagnosis Date Noted   Edema of lower extremity 08/30/2020   Anemia in chronic kidney disease 08/30/2020   Chronic venous insufficiency 05/11/2020   Aphasia 02/07/2019   Chronic kidney disease, stage 4 (severe) ([place]) 02/07/2019   B12 deficiency 04/28/2016   Essential hypertension 03/27/2015   Hypothyroidism 03/27/2015   Hyperlipidemia 03/27/2015   Allergic rhinitis due to pollen 03/27/2015    No Known Allergies  Past Surgical History:  Procedure Laterality Date   COLONOSCOPY  2012   normal- Dr Sonny Masters   ECTOPIC PREGNANCY SURGERY      Social History   Tobacco Use   Smoking status: Never   Smokeless tobacco: Never   Tobacco comments:    smoking cessation materials not required  Vaping Use   Vaping Use: Never used  Substance Use Topics   Alcohol use: No    Alcohol/week: 0.0 standard drinks of alcohol   Drug use: No     Medication list has been reviewed and updated.  Current Meds  Medication Sig   albuterol (VENTOLIN HFA) 108 (90 Base) MCG/ACT inhaler INHALE 2 PUFFS 4 TIMES A DAY   amLODipine (NORVASC) 5 MG tablet  Take 1 tablet (5 mg total) by mouth 2 (two) times daily.   Calcium Carb-Cholecalciferol (CALCIUM 600 + D PO) Take 1 capsule by mouth 2 (two) times daily.   Cholecalciferol (VITAMIN D3) 50 MCG (2000 UT) CAPS Take 1 capsule by mouth daily.   clopidogrel (PLAVIX) 75 MG tablet Take 1 tablet (75 mg total) by mouth daily.   cyanocobalamin (VITAMIN B12) 1000 MCG/ML injection Inject 11m once a month   doxycycline (MONODOX) 100 MG capsule Take 100 mg by mouth 2 (two) times daily.   fluticasone (FLONASE) 50 MCG/ACT nasal spray Place 2 sprays into both nostrils daily.   hydrochlorothiazide (HYDRODIURIL) 12.5  MG tablet Take 1 tablet (12.5 mg total) by mouth daily.   levothyroxine (SYNTHROID) 50 MCG tablet Take 1 tablet (50 mcg total) by mouth daily.   losartan (COZAAR) 50 MG tablet Take 1 tablet (50 mg total) by mouth daily.   Multiple Vitamins-Minerals (OCUVITE ADULT 50+ PO) Take 1 tablet by mouth daily.   simvastatin (ZOCOR) 20 MG tablet Take 1 tablet (20 mg total) by mouth daily.   vitamin C (ASCORBIC ACID) 500 MG tablet Take 500 mg by mouth daily.       09/01/2022   10:26 AM 08/21/2022    2:07 PM 12/10/2021   10:53 AM 12/10/2020    1:31 PM  GAD 7 : Generalized Anxiety Score  Nervous, Anxious, on Edge 0 0 0 0  Control/stop worrying 0 0 0 0  Worry too much - different things 0 0 0 0  Trouble relaxing 0 0 0 0  Restless 0 0 0 0  Easily annoyed or irritable 0 0 0 0  Afraid - awful might happen 0 0 0 0  Total GAD 7 Score 0 0 0 0  Anxiety Difficulty Not difficult at all Not difficult at all Not difficult at all        09/01/2022   10:26 AM 08/21/2022    2:06 PM 03/26/2022    1:44 PM  Depression screen PHQ 2/9  Decreased Interest 0 0 0  Down, Depressed, Hopeless 0 0 0  PHQ - 2 Score 0 0 0  Altered sleeping 0 0   Tired, decreased energy 0 0   Change in appetite 0 0   Feeling bad or failure about yourself  0 0   Trouble concentrating 0 0   Moving slowly or fidgety/restless 0 0   Suicidal thoughts 0 0   PHQ-9 Score 0 0   Difficult doing work/chores Not difficult at all Not difficult at all     BP Readings from Last 3 Encounters:  09/01/22 120/70  08/21/22 120/76  04/21/22 120/80    Physical Exam Vitals reviewed.  HENT:     Head: Normocephalic.     Right Ear: Tympanic membrane and ear canal normal.     Left Ear: Tympanic membrane and ear canal normal.     Nose: Nose normal.     Mouth/Throat:     Mouth: Mucous membranes are moist.  Eyes:     General: No scleral icterus.       Right eye: No discharge.        Left eye: No discharge.     Extraocular Movements: Extraocular  movements intact.     Conjunctiva/sclera: Conjunctivae normal.     Pupils: Pupils are equal, round, and reactive to light.  Neck:     Vascular: No carotid bruit.  Cardiovascular:     Rate and Rhythm: Normal rate and regular  rhythm.     Heart sounds: No murmur heard.    No friction rub. No gallop.  Pulmonary:     Breath sounds: Examination of the right-lower field reveals rales. Rales present. No decreased breath sounds, wheezing or rhonchi.  Musculoskeletal:     Cervical back: Normal range of motion and neck supple. No rigidity or tenderness.  Lymphadenopathy:     Cervical: No cervical adenopathy.  Neurological:     Mental Status: She is alert.     Wt Readings from Last 3 Encounters:  09/01/22 136 lb (61.7 kg)  08/21/22 139 lb (63 kg)  04/21/22 137 lb (62.1 kg)    BP 120/70   Pulse 65   Ht '5\' 2"'$  (1.575 m)   Wt 136 lb (61.7 kg)   SpO2 97%   BMI 24.87 kg/m   Assessment and Plan:  1. Pneumonia of right lower lobe due to infectious organism New onset.  Convalescing.  Stable and for the most part asymptomatic.  Recent trip to the beach last week was curtailed due to coughing and was found to have had a bilateral pneumonia with mild elevation of white count at the beach.  Patient was placed on doxycycline and is halfway through the course.  We will continue to completion and then have patient return in about 3 weeks to relisten to the lungs and to recheck chest x-ray.  Patient may return return sooner if there is exacerbation of symptoms or return of shortness of breath and cough.   Otilio Miu, MD

## 2022-09-09 DIAGNOSIS — N2581 Secondary hyperparathyroidism of renal origin: Secondary | ICD-10-CM | POA: Diagnosis not present

## 2022-09-09 DIAGNOSIS — N184 Chronic kidney disease, stage 4 (severe): Secondary | ICD-10-CM | POA: Diagnosis not present

## 2022-09-09 DIAGNOSIS — R600 Localized edema: Secondary | ICD-10-CM | POA: Diagnosis not present

## 2022-09-09 DIAGNOSIS — I129 Hypertensive chronic kidney disease with stage 1 through stage 4 chronic kidney disease, or unspecified chronic kidney disease: Secondary | ICD-10-CM | POA: Diagnosis not present

## 2022-09-15 DIAGNOSIS — I129 Hypertensive chronic kidney disease with stage 1 through stage 4 chronic kidney disease, or unspecified chronic kidney disease: Secondary | ICD-10-CM | POA: Diagnosis not present

## 2022-09-15 DIAGNOSIS — R600 Localized edema: Secondary | ICD-10-CM | POA: Diagnosis not present

## 2022-09-15 DIAGNOSIS — I10 Essential (primary) hypertension: Secondary | ICD-10-CM | POA: Diagnosis not present

## 2022-09-15 DIAGNOSIS — N1832 Chronic kidney disease, stage 3b: Secondary | ICD-10-CM | POA: Diagnosis not present

## 2022-09-15 DIAGNOSIS — N2581 Secondary hyperparathyroidism of renal origin: Secondary | ICD-10-CM | POA: Diagnosis not present

## 2022-09-22 ENCOUNTER — Ambulatory Visit
Admission: RE | Admit: 2022-09-22 | Discharge: 2022-09-22 | Disposition: A | Discharge status: Home / Self Care | Payer: Medicare HMO | Source: Ambulatory Visit | Attending: Family Medicine | Admitting: Family Medicine

## 2022-09-22 ENCOUNTER — Ambulatory Visit (INDEPENDENT_AMBULATORY_CARE_PROVIDER_SITE_OTHER): Payer: Medicare HMO | Admitting: Family Medicine

## 2022-09-22 ENCOUNTER — Ambulatory Visit
Admission: RE | Admit: 2022-09-22 | Discharge: 2022-09-22 | Disposition: A | Discharge status: Home / Self Care | Payer: Medicare HMO | Attending: Family Medicine | Admitting: Family Medicine

## 2022-09-22 ENCOUNTER — Encounter: Payer: Self-pay | Admitting: Family Medicine

## 2022-09-22 VITALS — BP 126/76 | HR 85 | Ht 62.0 in | Wt 132.0 lb

## 2022-09-22 DIAGNOSIS — J189 Pneumonia, unspecified organism: Secondary | ICD-10-CM | POA: Diagnosis not present

## 2022-09-22 DIAGNOSIS — J9811 Atelectasis: Secondary | ICD-10-CM | POA: Diagnosis not present

## 2022-09-22 NOTE — Progress Notes (Signed)
Date:  09/22/2022   Name:  Carly Marshall   DOB:  [birth year]   MRN:  [id number]   Chief Complaint: Follow-up (Pneumonia follow up- "feel better, but still weak")  Pneumonia She complains of cough. There is no chest tightness, difficulty breathing, frequent throat clearing, hemoptysis, hoarse voice, shortness of breath, sputum production or wheezing. This is a chronic problem. The problem occurs intermittently. The problem has been gradually improving. The cough is non-productive. Associated symptoms include dyspnea on exertion and malaise/fatigue. Pertinent negatives include no appetite change, chest pain, ear pain, fever, orthopnea, PND, postnasal drip, rhinorrhea or sore throat. Her symptoms are aggravated by minimal activity. She reports minimal improvement on treatment. Her symptoms are not alleviated by OTC cough suppressant.    Lab Results  Component Value Date   NA 140 04/21/2022   K 3.5 04/21/2022   CO2 22 04/21/2022   GLUCOSE 95 04/21/2022   BUN 34 (H) 04/21/2022   CREATININE 1.76 (H) 04/21/2022   CALCIUM 10.4 (H) 04/21/2022   EGFR 28 (L) 04/21/2022   GFRNONAA 24 (L) 06/19/2020   Lab Results  Component Value Date   CHOL 133 04/21/2022   HDL 62 04/21/2022   LDLCALC 55 04/21/2022   TRIG 80 04/21/2022   CHOLHDL 2.6 02/08/2019   Lab Results  Component Value Date   TSH 1.820 04/21/2022   Lab Results  Component Value Date   HGBA1C 5.7 (H) 08/21/2022   Lab Results  Component Value Date   WBC 14.6 08/28/2022   HGB 11.7 (A) 08/28/2022   HCT 34 (A) 08/28/2022   MCV 88 04/21/2022   PLT 331 08/28/2022   Lab Results  Component Value Date   ALT 9 04/21/2022   AST 21 04/21/2022   ALKPHOS 127 (H) 04/21/2022   BILITOT 0.6 04/21/2022   No results found for: "25OHVITD2", "25OHVITD3", "VD25OH"   Review of Systems  Constitutional:  Positive for malaise/fatigue. Negative for appetite change and fever.  HENT:  Negative for ear pain, hoarse voice, nosebleeds, postnasal  drip, rhinorrhea, sinus pressure and sore throat.   Respiratory:  Positive for cough. Negative for hemoptysis, sputum production, shortness of breath and wheezing.   Cardiovascular:  Positive for dyspnea on exertion. Negative for chest pain, palpitations and PND.    Patient Active Problem List   Diagnosis Date Noted   Edema of lower extremity 08/30/2020   Anemia in chronic kidney disease 08/30/2020   Chronic venous insufficiency 05/11/2020   Aphasia 02/07/2019   Chronic kidney disease, stage 4 (severe) ([place]) 02/07/2019   B12 deficiency 04/28/2016   Essential hypertension 03/27/2015   Hypothyroidism 03/27/2015   Hyperlipidemia 03/27/2015   Allergic rhinitis due to pollen 03/27/2015    No Known Allergies  Past Surgical History:  Procedure Laterality Date   COLONOSCOPY  2012   normal- Dr Sonny Masters   ECTOPIC PREGNANCY SURGERY      Social History   Tobacco Use   Smoking status: Never   Smokeless tobacco: Never   Tobacco comments:    smoking cessation materials not required  Vaping Use   Vaping Use: Never used  Substance Use Topics   Alcohol use: No    Alcohol/week: 0.0 standard drinks of alcohol   Drug use: No     Medication list has been reviewed and updated.  Current Meds  Medication Sig   albuterol (VENTOLIN HFA) 108 (90 Base) MCG/ACT inhaler INHALE 2 PUFFS 4 TIMES A DAY   amLODipine (NORVASC) 5 MG tablet Take 1  tablet (5 mg total) by mouth 2 (two) times daily.   Calcium Carb-Cholecalciferol (CALCIUM 600 + D PO) Take 1 capsule by mouth 2 (two) times daily.   Cholecalciferol (VITAMIN D3) 50 MCG (2000 UT) CAPS Take 1 capsule by mouth daily.   clopidogrel (PLAVIX) 75 MG tablet Take 1 tablet (75 mg total) by mouth daily.   cyanocobalamin (VITAMIN B12) 1000 MCG/ML injection Inject 56m once a month   fluticasone (FLONASE) 50 MCG/ACT nasal spray Place 2 sprays into both nostrils daily.   hydrochlorothiazide (HYDRODIURIL) 12.5 MG tablet Take 1 tablet (12.5 mg total) by mouth  daily.   levothyroxine (SYNTHROID) 50 MCG tablet Take 1 tablet (50 mcg total) by mouth daily.   losartan (COZAAR) 50 MG tablet Take 1 tablet (50 mg total) by mouth daily.   Multiple Vitamins-Minerals (OCUVITE ADULT 50+ PO) Take 1 tablet by mouth daily.   simvastatin (ZOCOR) 20 MG tablet Take 1 tablet (20 mg total) by mouth daily.   vitamin C (ASCORBIC ACID) 500 MG tablet Take 500 mg by mouth daily.       09/22/2022    2:13 PM 09/01/2022   10:26 AM 08/21/2022    2:07 PM 12/10/2021   10:53 AM  GAD 7 : Generalized Anxiety Score  Nervous, Anxious, on Edge 0 0 0 0  Control/stop worrying 0 0 0 0  Worry too much - different things 0 0 0 0  Trouble relaxing 0 0 0 0  Restless 0 0 0 0  Easily annoyed or irritable 0 0 0 0  Afraid - awful might happen 0 0 0 0  Total GAD 7 Score 0 0 0 0  Anxiety Difficulty Not difficult at all Not difficult at all Not difficult at all Not difficult at all       09/22/2022    2:12 PM 09/01/2022   10:26 AM 08/21/2022    2:06 PM  Depression screen PHQ 2/9  Decreased Interest 0 0 0  Down, Depressed, Hopeless 0 0 0  PHQ - 2 Score 0 0 0  Altered sleeping 0 0 0  Tired, decreased energy 0 0 0  Change in appetite 0 0 0  Feeling bad or failure about yourself  0 0 0  Trouble concentrating 0 0 0  Moving slowly or fidgety/restless 0 0 0  Suicidal thoughts 0 0 0  PHQ-9 Score 0 0 0  Difficult doing work/chores Not difficult at all Not difficult at all Not difficult at all    BP Readings from Last 3 Encounters:  09/22/22 126/76  09/01/22 120/70  08/21/22 120/76    Physical Exam Vitals and nursing note reviewed. Exam conducted with a chaperone present.  Constitutional:      General: She is not in acute distress.    Appearance: She is not diaphoretic.  HENT:     Head: Normocephalic and atraumatic.     Right Ear: External ear normal.     Left Ear: External ear normal.     Nose: Nose normal.     Mouth/Throat:     Mouth: Mucous membranes are moist.  Eyes:      General:        Right eye: No discharge.        Left eye: No discharge.     Conjunctiva/sclera: Conjunctivae normal.     Pupils: Pupils are equal, round, and reactive to light.  Neck:     Thyroid: No thyromegaly.     Vascular: No JVD.  Cardiovascular:  Rate and Rhythm: Normal rate and regular rhythm.     Heart sounds: Normal heart sounds, S1 normal and S2 normal. No murmur heard.    No systolic murmur is present.     No diastolic murmur is present.     No friction rub. No gallop. No S3 or S4 sounds.  Pulmonary:     Effort: Pulmonary effort is normal.     Breath sounds: Examination of the right-lower field reveals decreased breath sounds and rales. Decreased breath sounds and rales present.  Abdominal:     General: Bowel sounds are normal.     Palpations: Abdomen is soft. There is no mass.     Tenderness: There is no abdominal tenderness. There is no guarding.  Musculoskeletal:        General: Normal range of motion.     Cervical back: Normal range of motion and neck supple.  Lymphadenopathy:     Cervical: No cervical adenopathy.  Skin:    General: Skin is warm and dry.  Neurological:     Mental Status: She is alert.     Deep Tendon Reflexes: Reflexes are normal and symmetric.     Wt Readings from Last 3 Encounters:  09/22/22 132 lb (59.9 kg)  09/01/22 136 lb (61.7 kg)  08/21/22 139 lb (63 kg)    BP 126/76   Pulse 85   Ht 5' 2"$  (1.575 m)   Wt 132 lb (59.9 kg)   SpO2 96%   BMI 24.14 kg/m   Assessment and Plan:  1. Pneumonia of right lower lobe due to infectious organism Patient has follow-up right lower lobe pneumonia from the beach approximately 4 weeks ago for which she was treated on doxycycline.  Patient's symptomatology is improved but exam still notes rales in the right posterior lobe.  We will obtain chest x-ray to assess the healing process of the patient's concern. - DG Chest 2 View; Future    Otilio Miu, MD

## 2022-09-23 ENCOUNTER — Telehealth: Payer: Self-pay | Admitting: Family Medicine

## 2022-09-23 NOTE — Telephone Encounter (Signed)
Copied from [place]. Topic: General - Inquiry >> Sep 23, 2022  3:21 PM Penni Bombard wrote: Reason for CRM: pt had xray done yesterday and is asking about the results  CB#  [phone]

## 2022-09-24 ENCOUNTER — Telehealth: Payer: Self-pay | Admitting: Family Medicine

## 2022-09-24 NOTE — Telephone Encounter (Signed)
Copied from [place]. Topic: General - Other >> Sep 24, 2022  7:36 AM Chapman Fitch wrote: Reason for CRM: Colletta [place] called to return Tara's call / please advise

## 2022-09-25 ENCOUNTER — Encounter: Payer: Self-pay | Admitting: Family Medicine

## 2022-09-25 ENCOUNTER — Ambulatory Visit (INDEPENDENT_AMBULATORY_CARE_PROVIDER_SITE_OTHER): Payer: Medicare HMO | Admitting: Family Medicine

## 2022-09-25 VITALS — BP 128/64 | HR 68 | Ht 62.0 in | Wt 134.0 lb

## 2022-09-25 DIAGNOSIS — R053 Chronic cough: Secondary | ICD-10-CM | POA: Diagnosis not present

## 2022-09-25 DIAGNOSIS — R9389 Abnormal findings on diagnostic imaging of other specified body structures: Secondary | ICD-10-CM

## 2022-09-25 NOTE — Progress Notes (Signed)
Date:  09/25/2022   Name:  Carly Marshall   DOB:  07-21-1936   MRN:  [id number]   Chief Complaint: order CT  Patient is a 87 year old female who presents for a comprehensive physical exam. The patient reports the following problems: abnormal chest film. Health maintenance has been reviewed up to date.      Lab Results  Component Value Date   NA 140 04/21/2022   K 3.5 04/21/2022   CO2 22 04/21/2022   GLUCOSE 95 04/21/2022   BUN 34 (H) 04/21/2022   CREATININE 1.76 (H) 04/21/2022   CALCIUM 10.4 (H) 04/21/2022   EGFR 28 (L) 04/21/2022   GFRNONAA 24 (L) 06/19/2020   Lab Results  Component Value Date   CHOL 133 04/21/2022   HDL 62 04/21/2022   LDLCALC 55 04/21/2022   TRIG 80 04/21/2022   CHOLHDL 2.6 02/08/2019   Lab Results  Component Value Date   TSH 1.820 04/21/2022   Lab Results  Component Value Date   HGBA1C 5.7 (H) 08/21/2022   Lab Results  Component Value Date   WBC 14.6 08/28/2022   HGB 11.7 (A) 08/28/2022   HCT 34 (A) 08/28/2022   MCV 88 04/21/2022   PLT 331 08/28/2022   Lab Results  Component Value Date   ALT 9 04/21/2022   AST 21 04/21/2022   ALKPHOS 127 (H) 04/21/2022   BILITOT 0.6 04/21/2022   No results found for: "25OHVITD2", "25OHVITD3", "VD25OH"   Review of Systems  Constitutional:  Positive for appetite change. Negative for fatigue, fever and unexpected weight change.  HENT:  Negative for trouble swallowing.   Eyes:  Negative for visual disturbance.  Respiratory:  Positive for cough and shortness of breath. Negative for chest tightness and wheezing.   Cardiovascular:  Negative for chest pain, palpitations and leg swelling.  Gastrointestinal:  Negative for abdominal pain and blood in stool.  Endocrine: Negative for polydipsia and polyuria.  Genitourinary:  Negative for difficulty urinating.  Musculoskeletal:  Negative for arthralgias.    Patient Active Problem List   Diagnosis Date Noted   Edema of lower extremity 08/30/2020    Anemia in chronic kidney disease 08/30/2020   Chronic venous insufficiency 05/11/2020   Aphasia 02/07/2019   Chronic kidney disease, stage 4 (severe) ([place]) 02/07/2019   B12 deficiency 04/28/2016   Essential hypertension 03/27/2015   Hypothyroidism 03/27/2015   Hyperlipidemia 03/27/2015   Allergic rhinitis due to pollen 03/27/2015    No Known Allergies  Past Surgical History:  Procedure Laterality Date   COLONOSCOPY  2012   normal- Dr Sonny Masters   ECTOPIC PREGNANCY SURGERY      Social History   Tobacco Use   Smoking status: Never   Smokeless tobacco: Never   Tobacco comments:    smoking cessation materials not required  Vaping Use   Vaping Use: Never used  Substance Use Topics   Alcohol use: No    Alcohol/week: 0.0 standard drinks of alcohol   Drug use: No     Medication list has been reviewed and updated.  Current Meds  Medication Sig   albuterol (VENTOLIN HFA) 108 (90 Base) MCG/ACT inhaler INHALE 2 PUFFS 4 TIMES A DAY   amLODipine (NORVASC) 5 MG tablet Take 1 tablet (5 mg total) by mouth 2 (two) times daily.   Calcium Carb-Cholecalciferol (CALCIUM 600 + D PO) Take 1 capsule by mouth 2 (two) times daily.   Cholecalciferol (VITAMIN D3) 50 MCG (2000 UT) CAPS Take 1 capsule  by mouth daily.   clopidogrel (PLAVIX) 75 MG tablet Take 1 tablet (75 mg total) by mouth daily.   cyanocobalamin (VITAMIN B12) 1000 MCG/ML injection Inject 62m once a month   fluticasone (FLONASE) 50 MCG/ACT nasal spray Place 2 sprays into both nostrils daily.   hydrochlorothiazide (HYDRODIURIL) 12.5 MG tablet Take 1 tablet (12.5 mg total) by mouth daily.   levothyroxine (SYNTHROID) 50 MCG tablet Take 1 tablet (50 mcg total) by mouth daily.   losartan (COZAAR) 50 MG tablet Take 1 tablet (50 mg total) by mouth daily.   Multiple Vitamins-Minerals (OCUVITE ADULT 50+ PO) Take 1 tablet by mouth daily.   simvastatin (ZOCOR) 20 MG tablet Take 1 tablet (20 mg total) by mouth daily.   vitamin C (ASCORBIC ACID)  500 MG tablet Take 500 mg by mouth daily.       09/25/2022   10:48 AM 09/22/2022    2:13 PM 09/01/2022   10:26 AM 08/21/2022    2:07 PM  GAD 7 : Generalized Anxiety Score  Nervous, Anxious, on Edge 0 0 0 0  Control/stop worrying 0 0 0 0  Worry too much - different things 0 0 0 0  Trouble relaxing 0 0 0 0  Restless 0 0 0 0  Easily annoyed or irritable 0 0 0 0  Afraid - awful might happen 0 0 0 0  Total GAD 7 Score 0 0 0 0  Anxiety Difficulty Not difficult at all Not difficult at all Not difficult at all Not difficult at all       09/25/2022   10:48 AM 09/22/2022    2:12 PM 09/01/2022   10:26 AM  Depression screen PHQ 2/9  Decreased Interest 0 0 0  Down, Depressed, Hopeless 0 0 0  PHQ - 2 Score 0 0 0  Altered sleeping 0 0 0  Tired, decreased energy 0 0 0  Change in appetite 0 0 0  Feeling bad or failure about yourself  0 0 0  Trouble concentrating 0 0 0  Moving slowly or fidgety/restless 0 0 0  Suicidal thoughts 0 0 0  PHQ-9 Score 0 0 0  Difficult doing work/chores Not difficult at all Not difficult at all Not difficult at all    BP Readings from Last 3 Encounters:  09/25/22 128/64  09/22/22 126/76  09/01/22 120/70    Physical Exam Vitals and nursing note reviewed. Exam conducted with a chaperone present.  Constitutional:      General: She is not in acute distress.    Appearance: She is not diaphoretic.  HENT:     Head: Normocephalic and atraumatic.     Right Ear: External ear normal.     Left Ear: External ear normal.     Nose: Nose normal.  Eyes:     General:        Right eye: No discharge.        Left eye: No discharge.     Conjunctiva/sclera: Conjunctivae normal.     Pupils: Pupils are equal, round, and reactive to light.  Neck:     Thyroid: No thyromegaly.     Vascular: No JVD.  Cardiovascular:     Rate and Rhythm: Normal rate and regular rhythm.     Heart sounds: Normal heart sounds, S1 normal and S2 normal. No murmur heard.    No systolic murmur is  present.     No diastolic murmur is present.     No friction rub. No gallop. No S3  or S4 sounds.  Pulmonary:     Effort: Pulmonary effort is normal.     Breath sounds: No decreased air movement. Examination of the right-lower field reveals decreased breath sounds and rales. Examination of the left-lower field reveals decreased breath sounds and rales. Decreased breath sounds and rales present. No wheezing or rhonchi.  Abdominal:     Palpations: Abdomen is soft. There is no hepatomegaly, splenomegaly or mass.     Tenderness: There is no abdominal tenderness. There is no guarding.  Musculoskeletal:        General: Normal range of motion.     Cervical back: Normal range of motion and neck supple.  Lymphadenopathy:     Cervical: No cervical adenopathy.  Skin:    General: Skin is warm and dry.  Neurological:     Mental Status: She is alert.     Deep Tendon Reflexes: Reflexes are normal and symmetric.     Wt Readings from Last 3 Encounters:  09/25/22 134 lb (60.8 kg)  09/22/22 132 lb (59.9 kg)  09/01/22 136 lb (61.7 kg)    BP 128/64   Pulse 68   Ht 5' 2"$  (1.575 m)   Wt 134 lb (60.8 kg)   SpO2 98%   BMI 24.51 kg/m   Assessment and Plan:  1. Abnormal chest x-ray New onset following pneumonia that was [place] the [place] patient still has had some fine crackles rales in the bases bilateral.  Repeat of chest x-ray notes a little asymmetry in the hilum which may reflect increased hilar mass or adenopathy and we will follow-up as suggested with CT with contrast for further evaluation of the hilar area to rule out such. - CT Chest W Contrast  2. Persistent cough Cough is improving however with decreased frequency nonproductive in nature and we will continue to monitor for improvement but follow-up with chest x-ray as discussed. - CT Chest W Contrast    Otilio Miu, MD

## 2022-09-26 ENCOUNTER — Ambulatory Visit
Admission: RE | Admit: 2022-09-26 | Discharge: 2022-09-26 | Disposition: A | Discharge status: Home / Self Care | Payer: Medicare HMO | Source: Ambulatory Visit | Attending: Family Medicine | Admitting: Family Medicine

## 2022-09-26 DIAGNOSIS — R053 Chronic cough: Secondary | ICD-10-CM | POA: Diagnosis not present

## 2022-09-26 DIAGNOSIS — R9389 Abnormal findings on diagnostic imaging of other specified body structures: Secondary | ICD-10-CM | POA: Insufficient documentation

## 2022-09-26 DIAGNOSIS — R0602 Shortness of breath: Secondary | ICD-10-CM | POA: Diagnosis not present

## 2022-09-26 DIAGNOSIS — I700 Atherosclerosis of aorta: Secondary | ICD-10-CM | POA: Diagnosis not present

## 2022-09-26 DIAGNOSIS — J479 Bronchiectasis, uncomplicated: Secondary | ICD-10-CM | POA: Diagnosis not present

## 2022-09-26 LAB — POCT I-STAT CREATININE: Creatinine, Ser: 1.6 mg/dL — ABNORMAL HIGH (ref 0.44–1.00)

## 2022-09-26 MED ORDER — IOHEXOL 300 MG/ML  SOLN
60.0000 mL | Freq: Once | INTRAMUSCULAR | Status: AC | PRN
  Administered 2022-09-26: 60 mL via INTRAVENOUS

## 2022-09-26 NOTE — Progress Notes (Signed)
PC to pt discussed CT results, voiced understanding.

## 2022-10-24 ENCOUNTER — Other Ambulatory Visit: Payer: Self-pay | Admitting: Family Medicine

## 2022-10-24 DIAGNOSIS — I10 Essential (primary) hypertension: Secondary | ICD-10-CM

## 2022-10-24 NOTE — Telephone Encounter (Signed)
Requested Prescriptions  Pending Prescriptions Disp Refills   amLODipine (NORVASC) 5 MG tablet [Pharmacy Med Name: AMLODIPINE BESYLATE 5 MG TAB] 180 tablet 0    Sig: TAKE 1 TABLET BY MOUTH TWICE A DAY     Cardiovascular: Calcium Channel Blockers 2 Passed - 10/24/2022  1:49 AM      Passed - Last BP in normal range    BP Readings from Last 1 Encounters:  09/25/22 128/64         Passed - Last Heart Rate in normal range    Pulse Readings from Last 1 Encounters:  09/25/22 68         Passed - Valid encounter within last 6 months    Recent Outpatient Visits           4 weeks ago Abnormal chest x-ray   [facility] at [place], Deanna C, MD   1 month ago Pneumonia of right lower lobe due to infectious organism   [place] at [place], Deanna C, MD   1 month ago Pneumonia of right lower lobe due to infectious organism   [place] at [place], Deanna C, MD   2 months ago Prediabetes   [place] at [place], [place], MD   6 months ago Essential hypertension   [place] at [place], [place], MD       Future Appointments             In 1 month Juline Patch, MD [place] at [facility], [facility]

## 2022-11-07 ENCOUNTER — Other Ambulatory Visit: Payer: Self-pay | Admitting: Family Medicine

## 2022-11-07 DIAGNOSIS — I10 Essential (primary) hypertension: Secondary | ICD-10-CM

## 2022-11-07 DIAGNOSIS — E782 Mixed hyperlipidemia: Secondary | ICD-10-CM

## 2022-11-07 NOTE — Telephone Encounter (Signed)
Requested medication (s) are due for refill today:   Yes for both  Requested medication (s) are on the active medication list:   Yes for both  Future visit scheduled:   Yes   Last ordered: Both on 04/21/2022 #90, 1 refill  Returned because per protocol a potassium is due.     Requested Prescriptions  Pending Prescriptions Disp Refills   losartan (COZAAR) 50 MG tablet [Pharmacy Med Name: LOSARTAN POTASSIUM 50 MG TAB] 90 tablet 1    Sig: TAKE 1 TABLET BY MOUTH EVERY DAY     Cardiovascular:  Angiotensin Receptor Blockers Failed - 11/07/2022  2:26 AM      Failed - Cr in normal range and within 180 days    Creatinine, Ser  Date Value Ref Range Status  09/26/2022 1.60 (H) 0.44 - 1.00 mg/dL Final         Failed - K in normal range and within 180 days    Potassium  Date Value Ref Range Status  04/21/2022 3.5 3.5 - 5.2 mmol/L Final         Passed - Patient is not pregnant      Passed - Last BP in normal range    BP Readings from Last 1 Encounters:  09/25/22 128/64         Passed - Valid encounter within last 6 months    Recent Outpatient Visits           1 month ago Abnormal chest x-ray   [facility] at [place], Deanna C, MD   1 month ago Pneumonia of right lower lobe due to infectious organism   [place] at [place], Deanna C, MD   2 months ago Pneumonia of right lower lobe due to infectious organism   [place] at [place], Deanna C, MD   2 months ago Prediabetes   [place] at [place], Deanna C, MD   6 months ago Essential hypertension   [place] at [place], [place], MD       Future Appointments             In 1 month Juline Patch, MD [facility] at [facility], [place]              simvastatin (ZOCOR) 20 MG tablet [Pharmacy Med Name: SIMVASTATIN 20 MG TABLET] 90 tablet 1    Sig: TAKE 1 TABLET BY MOUTH EVERY DAY     Cardiovascular:  Antilipid - Statins Failed - 11/07/2022  2:26 AM      Failed - Lipid Panel in normal range within the last 12 months    Cholesterol, Total  Date Value Ref Range Status  04/21/2022 133 100 - 199 mg/dL Final   LDL Chol Calc (NIH)  Date Value Ref Range Status  04/21/2022 55 0 - 99 mg/dL Final   HDL  Date Value Ref Range Status  04/21/2022 62 >39 mg/dL Final   Triglycerides  Date Value Ref Range Status  04/21/2022 80 0 - 149 mg/dL Final         Passed - Patient is not pregnant      Passed - Valid encounter within last 12 months    Recent Outpatient Visits  1 month ago Abnormal chest x-ray   [facility] at [place], Deanna C, MD   1 month ago Pneumonia of right lower lobe due to infectious organism   [place] at [place], Deanna C, MD   2 months ago Pneumonia of right lower lobe due to infectious organism   [place] at [place], Deanna C, MD   2 months ago Prediabetes   [place] at [place], [place], MD   6 months ago Essential hypertension   [place] at [place], [place], MD       Future Appointments             In 1 month Juline Patch, MD [place] at [facility], [facility]

## 2022-11-26 DIAGNOSIS — H524 Presbyopia: Secondary | ICD-10-CM | POA: Diagnosis not present

## 2022-11-26 DIAGNOSIS — H40023 Open angle with borderline findings, high risk, bilateral: Secondary | ICD-10-CM | POA: Diagnosis not present

## 2022-11-26 DIAGNOSIS — H35033 Hypertensive retinopathy, bilateral: Secondary | ICD-10-CM | POA: Diagnosis not present

## 2022-12-04 ENCOUNTER — Other Ambulatory Visit: Payer: Self-pay | Admitting: Family Medicine

## 2022-12-04 DIAGNOSIS — E538 Deficiency of other specified B group vitamins: Secondary | ICD-10-CM

## 2022-12-04 NOTE — Telephone Encounter (Signed)
Requested Prescriptions  Pending Prescriptions Disp Refills   cyanocobalamin (VITAMIN B12) 1000 MCG/ML injection [Pharmacy Med Name: CYANOCOBALAMIN 1,000 MCG/ML VL] 3 mL 0    Sig: INJECT ONCE A MONTH     Endocrinology:  Vitamins - Vitamin B12 Failed - 12/04/2022  1:50 AM      Failed - HCT in normal range and within 360 days    HCT  Date Value Ref Range Status  08/28/2022 34 (A) 36 - 46 Final   Hematocrit  Date Value Ref Range Status  04/21/2022 37.1 34.0 - 46.6 % Final         Failed - HGB in normal range and within 360 days    Hemoglobin  Date Value Ref Range Status  08/28/2022 11.7 (A) 12.0 - 16.0 Final  04/21/2022 12.3 11.1 - 15.9 g/dL Final         Failed - B12 Level in normal range and within 360 days    Vitamin B-12  Date Value Ref Range Status  11/04/2018 479 232 - 1,245 pg/mL Final         Passed - Valid encounter within last 12 months    Recent Outpatient Visits           2 months ago Abnormal chest x-ray   [facility], MD   2 months ago Pneumonia of right lower lobe due to infectious organism   [facility] at [facility], MD   3 months ago Pneumonia of right lower lobe due to infectious organism   [facility] at [facility], MD   3 months ago Prediabetes   [facility] at [facility], MD   7 months ago Essential hypertension   [place] [facility] at [facility], MD       Future Appointments             In 2 weeks Duanne Limerick, MD [facility] Primary Care & [facility] Medicine at [facility], [facility]

## 2022-12-21 ENCOUNTER — Other Ambulatory Visit: Payer: Self-pay | Admitting: Family Medicine

## 2022-12-21 DIAGNOSIS — I10 Essential (primary) hypertension: Secondary | ICD-10-CM

## 2022-12-22 ENCOUNTER — Ambulatory Visit (INDEPENDENT_AMBULATORY_CARE_PROVIDER_SITE_OTHER): Payer: Medicare HMO | Admitting: Family Medicine

## 2022-12-22 ENCOUNTER — Encounter: Payer: Self-pay | Admitting: Family Medicine

## 2022-12-22 ENCOUNTER — Other Ambulatory Visit: Payer: Self-pay | Admitting: Family Medicine

## 2022-12-22 VITALS — BP 112/74 | HR 62 | Ht 62.0 in | Wt 138.0 lb

## 2022-12-22 DIAGNOSIS — R29898 Other symptoms and signs involving the musculoskeletal system: Secondary | ICD-10-CM | POA: Diagnosis not present

## 2022-12-22 DIAGNOSIS — E782 Mixed hyperlipidemia: Secondary | ICD-10-CM | POA: Diagnosis not present

## 2022-12-22 DIAGNOSIS — I679 Cerebrovascular disease, unspecified: Secondary | ICD-10-CM | POA: Diagnosis not present

## 2022-12-22 DIAGNOSIS — E538 Deficiency of other specified B group vitamins: Secondary | ICD-10-CM

## 2022-12-22 DIAGNOSIS — R7303 Prediabetes: Secondary | ICD-10-CM

## 2022-12-22 DIAGNOSIS — J3089 Other allergic rhinitis: Secondary | ICD-10-CM | POA: Diagnosis not present

## 2022-12-22 DIAGNOSIS — E039 Hypothyroidism, unspecified: Secondary | ICD-10-CM

## 2022-12-22 DIAGNOSIS — I10 Essential (primary) hypertension: Secondary | ICD-10-CM

## 2022-12-22 MED ORDER — SIMVASTATIN 20 MG PO TABS: 20.0000 mg | ORAL_TABLET | Freq: Every day | ORAL | 1 refills | Status: DC

## 2022-12-22 MED ORDER — CLOPIDOGREL BISULFATE 75 MG PO TABS: 75.0000 mg | ORAL_TABLET | Freq: Every day | ORAL | 1 refills | Status: DC

## 2022-12-22 MED ORDER — CYANOCOBALAMIN 1000 MCG/ML IJ SOLN: INTRAMUSCULAR | 0 refills | Status: DC

## 2022-12-22 MED ORDER — AMLODIPINE BESYLATE 5 MG PO TABS: 5.0000 mg | ORAL_TABLET | Freq: Two times a day (BID) | ORAL | 1 refills | Status: DC

## 2022-12-22 MED ORDER — LOSARTAN POTASSIUM 50 MG PO TABS: 50.0000 mg | ORAL_TABLET | Freq: Every day | ORAL | 1 refills | Status: DC

## 2022-12-22 MED ORDER — LEVOTHYROXINE SODIUM 50 MCG PO TABS: 50.0000 ug | ORAL_TABLET | Freq: Every day | ORAL | 1 refills | Status: DC

## 2022-12-22 MED ORDER — FLUTICASONE PROPIONATE 50 MCG/ACT NA SUSP: 2.0000 | Freq: Every day | NASAL | 5 refills | Status: AC

## 2022-12-22 MED ORDER — HYDROCHLOROTHIAZIDE 12.5 MG PO TABS: 12.5000 mg | ORAL_TABLET | Freq: Every day | ORAL | 1 refills | Status: DC

## 2022-12-22 NOTE — Progress Notes (Signed)
Date:  12/22/2022   Name:  Carly Marshall   DOB:  30-Oct-1935   MRN:  [id number]   Chief Complaint: Hypertension, Allergic Rhinitis , Hypothyroidism, Hyperlipidemia, Coronary Artery Disease, vitamin b12 deficiency, and Prediabetes  Hypertension This is a chronic problem. The current episode started more than 1 year ago. The problem has been gradually improving since onset. The problem is controlled. Pertinent negatives include no anxiety, blurred vision, chest pain, headaches, malaise/fatigue, neck pain, orthopnea, palpitations, peripheral edema, PND, shortness of breath or sweats. There are no associated agents to hypertension. Past treatments include angiotensin blockers, diuretics and calcium channel blockers. The current treatment provides moderate improvement. There are no compliance problems.  There is no history of angina, CAD/MI, CVA, heart failure or left ventricular hypertrophy. Identifiable causes of hypertension include a thyroid problem. There is no history of chronic renal disease, a hypertension causing med or renovascular disease.  Hyperlipidemia This is a chronic problem. The current episode started more than 1 year ago. The problem is controlled. Recent lipid tests were reviewed and are normal. Exacerbating diseases include diabetes and hypothyroidism. She has no history of chronic renal disease. Pertinent negatives include no chest pain or shortness of breath. The current treatment provides moderate improvement of lipids. There are no compliance problems.  Risk factors for coronary artery disease include hypertension and dyslipidemia.  Coronary Artery Disease Presents for follow-up visit. Pertinent negatives include no chest pain, chest tightness, dizziness, leg swelling, palpitations, shortness of breath or weight gain. Risk factors include hyperlipidemia and hypertension. The symptoms have been stable. Compliance with diet is good. Compliance with exercise is good. Compliance  with medications is good.  Diabetes Diabetes type: prediabetes. There are no hypoglycemic associated symptoms. Pertinent negatives for hypoglycemia include no dizziness, headaches or sweats. Pertinent negatives for diabetes include no blurred vision and no chest pain. There are no hypoglycemic complications. There are no diabetic complications. Pertinent negatives for diabetic complications include no CVA, heart disease or peripheral neuropathy. She is following a generally healthy diet. An ACE inhibitor/angiotensin II receptor blocker is being taken.  Thyroid Problem Presents for follow-up visit. Patient reports no constipation, depressed mood, dry skin, hair loss, nail problem, palpitations or weight gain. Her past medical history is significant for diabetes and hyperlipidemia. There is no history of heart failure.    Lab Results  Component Value Date   NA 140 04/21/2022   K 3.5 04/21/2022   CO2 22 04/21/2022   GLUCOSE 95 04/21/2022   BUN 34 (H) 04/21/2022   CREATININE 1.60 (H) 09/26/2022   CALCIUM 10.4 (H) 04/21/2022   EGFR 28 (L) 04/21/2022   GFRNONAA 24 (L) 06/19/2020   Lab Results  Component Value Date   CHOL 133 04/21/2022   HDL 62 04/21/2022   LDLCALC 55 04/21/2022   TRIG 80 04/21/2022   CHOLHDL 2.6 02/08/2019   Lab Results  Component Value Date   TSH 1.820 04/21/2022   Lab Results  Component Value Date   HGBA1C 5.7 (H) 08/21/2022   Lab Results  Component Value Date   WBC 14.6 08/28/2022   HGB 11.7 (A) 08/28/2022   HCT 34 (A) 08/28/2022   MCV 88 04/21/2022   PLT 331 08/28/2022   Lab Results  Component Value Date   ALT 9 04/21/2022   AST 21 04/21/2022   ALKPHOS 127 (H) 04/21/2022   BILITOT 0.6 04/21/2022   No results found for: "25OHVITD2", "25OHVITD3", "VD25OH"   Review of Systems  Constitutional:  Negative for  malaise/fatigue and weight gain.  HENT:  Negative for ear discharge, postnasal drip and rhinorrhea.   Eyes:  Negative for blurred vision.   Respiratory:  Negative for apnea, cough, choking, chest tightness, shortness of breath, wheezing and stridor.        Legs "give out" with walking   Cardiovascular:  Negative for chest pain, palpitations, orthopnea, leg swelling and PND.  Gastrointestinal:  Negative for abdominal distention, abdominal pain and constipation.  Genitourinary:  Negative for frequency.  Musculoskeletal:  Negative for neck pain.  Neurological:  Negative for dizziness and headaches.  Hematological:  Negative for adenopathy.    Patient Active Problem List   Diagnosis Date Noted   Edema of lower extremity 08/30/2020   Anemia in chronic kidney disease 08/30/2020   Chronic venous insufficiency 05/11/2020   Aphasia 02/07/2019   Chronic kidney disease, stage 4 (severe) (HCC) 02/07/2019   B12 deficiency 04/28/2016   Essential hypertension 03/27/2015   Hypothyroidism 03/27/2015   Hyperlipidemia 03/27/2015   Allergic rhinitis due to pollen 03/27/2015    No Known Allergies  Past Surgical History:  Procedure Laterality Date   COLONOSCOPY  2012   normal- Dr Henrene Hawking   ECTOPIC PREGNANCY SURGERY      Social History   Tobacco Use   Smoking status: Never   Smokeless tobacco: Never   Tobacco comments:    smoking cessation materials not required  Vaping Use   Vaping Use: Never used  Substance Use Topics   Alcohol use: No    Alcohol/week: 0.0 standard drinks of alcohol   Drug use: No     Medication list has been reviewed and updated.  Current Meds  Medication Sig   albuterol (VENTOLIN HFA) 108 (90 Base) MCG/ACT inhaler INHALE 2 PUFFS 4 TIMES A DAY   amLODipine (NORVASC) 5 MG tablet TAKE 1 TABLET BY MOUTH TWICE A DAY   Calcium Carb-Cholecalciferol (CALCIUM 600 + D PO) Take 1 capsule by mouth 2 (two) times daily.   Cholecalciferol (VITAMIN D3) 50 MCG (2000 UT) CAPS Take 1 capsule by mouth daily.   clopidogrel (PLAVIX) 75 MG tablet Take 1 tablet (75 mg total) by mouth daily.   cyanocobalamin (VITAMIN B12)  1000 MCG/ML injection INJECT ONCE A MONTH   fluticasone (FLONASE) 50 MCG/ACT nasal spray Place 2 sprays into both nostrils daily.   hydrochlorothiazide (HYDRODIURIL) 12.5 MG tablet Take 1 tablet (12.5 mg total) by mouth daily.   levothyroxine (SYNTHROID) 50 MCG tablet Take 1 tablet (50 mcg total) by mouth daily.   losartan (COZAAR) 50 MG tablet TAKE 1 TABLET BY MOUTH EVERY DAY   Multiple Vitamins-Minerals (OCUVITE ADULT 50+ PO) Take 1 tablet by mouth daily.   simvastatin (ZOCOR) 20 MG tablet TAKE 1 TABLET BY MOUTH EVERY DAY   vitamin C (ASCORBIC ACID) 500 MG tablet Take 500 mg by mouth daily.       12/22/2022    1:28 PM 09/25/2022   10:48 AM 09/22/2022    2:13 PM 09/01/2022   10:26 AM  GAD 7 : Generalized Anxiety Score  Nervous, Anxious, on Edge 0 0 0 0  Control/stop worrying 0 0 0 0  Worry too much - different things 0 0 0 0  Trouble relaxing 0 0 0 0  Restless 0 0 0 0  Easily annoyed or irritable 0 0 0 0  Afraid - awful might happen 0 0 0 0  Total GAD 7 Score 0 0 0 0  Anxiety Difficulty Not difficult at all Not difficult  at all Not difficult at all Not difficult at all       12/22/2022    1:28 PM 09/25/2022   10:48 AM 09/22/2022    2:12 PM  Depression screen PHQ 2/9  Decreased Interest 0 0 0  Down, Depressed, Hopeless 0 0 0  PHQ - 2 Score 0 0 0  Altered sleeping 0 0 0  Tired, decreased energy 0 0 0  Change in appetite 0 0 0  Feeling bad or failure about yourself  0 0 0  Trouble concentrating 0 0 0  Moving slowly or fidgety/restless 0 0 0  Suicidal thoughts 0 0 0  PHQ-9 Score 0 0 0  Difficult doing work/chores Not difficult at all Not difficult at all Not difficult at all    BP Readings from Last 3 Encounters:  12/22/22 112/74  09/25/22 128/64  09/22/22 126/76    Physical Exam Vitals and nursing note reviewed. Exam conducted with a chaperone present.  Constitutional:      General: She is not in acute distress.    Appearance: She is not diaphoretic.  HENT:      Head: Normocephalic and atraumatic.     Right Ear: External ear normal.     Left Ear: External ear normal.     Nose: Nose normal.  Eyes:     General:        Right eye: No discharge.        Left eye: No discharge.     Conjunctiva/sclera: Conjunctivae normal.     Pupils: Pupils are equal, round, and reactive to light.  Neck:     Thyroid: No thyromegaly.     Vascular: No JVD.  Cardiovascular:     Rate and Rhythm: Regular rhythm. Bradycardia present.     Heart sounds: Normal heart sounds. No murmur heard.    No friction rub. No gallop.  Pulmonary:     Effort: Pulmonary effort is normal.     Breath sounds: Normal breath sounds. No wheezing, rhonchi or rales.  Chest:     Chest wall: No tenderness.  Abdominal:     General: Bowel sounds are normal.     Palpations: Abdomen is soft. There is no mass.     Tenderness: There is no abdominal tenderness. There is no guarding.  Musculoskeletal:        General: Normal range of motion.     Cervical back: Normal range of motion and neck supple.  Lymphadenopathy:     Cervical: No cervical adenopathy.  Skin:    General: Skin is warm and dry.  Neurological:     Mental Status: She is alert.     Deep Tendon Reflexes: Reflexes are normal and symmetric.     Wt Readings from Last 3 Encounters:  12/22/22 138 lb (62.6 kg)  09/25/22 134 lb (60.8 kg)  09/22/22 132 lb (59.9 kg)    BP 112/74   Pulse 62   Ht 5\' 2"  (1.575 m)   Wt 138 lb (62.6 kg)   SpO2 96%   BMI 25.24 kg/m   Assessment and Plan: 1. Essential hypertension Chronic.  Controlled.  Stable.  Blood pressure 112/74.  Currently is on amlodipine 5 mg twice a day hydrochlorothiazide 12.5 mg once a day and losartan 50 mg once a day.  Will check renal function panel for electrolytes. - amLODipine (NORVASC) 5 MG tablet; Take 1 tablet (5 mg total) by mouth 2 (two) times daily.  Dispense: 180 tablet; Refill: 1 - hydrochlorothiazide (HYDRODIURIL)  12.5 MG tablet; Take 1 tablet (12.5 mg total)  by mouth daily.  Dispense: 90 tablet; Refill: 1 - losartan (COZAAR) 50 MG tablet; Take 1 tablet (50 mg total) by mouth daily.  Dispense: 90 tablet; Refill: 1 - Renal Function Panel  2. Cerebral vascular disease Chronic.  Controlled.  Stable.  Previous history of CBD and will continue Plavix 75 mg once a day. - clopidogrel (PLAVIX) 75 MG tablet; Take 1 tablet (75 mg total) by mouth daily.  Dispense: 90 tablet; Refill: 1  3. B12 deficiency Chronic.  Controlled.  Stable.  Continue with injectable B12 once a month. - cyanocobalamin (VITAMIN B12) 1000 MCG/ML injection; INJECT ONCE A MONTH  Dispense: 3 mL; Refill: 0  4. Non-seasonal allergic rhinitis, unspecified trigger Chronic.  Controlled.  Stable.  During his seasonal allergies and infectious exposure patient will continue Flonase nasal spray 2 sprays both nostrils daily. - fluticasone (FLONASE) 50 MCG/ACT nasal spray; Place 2 sprays into both nostrils daily.  Dispense: 16 g; Refill: 5  5. Hypothyroidism, unspecified type Chronic.  Controlled.  Stable.  Will check thyroid function panel TSH.  Pending current TSH levels will likely continue levothyroxine 50 mcg daily. - levothyroxine (SYNTHROID) 50 MCG tablet; Take 1 tablet (50 mcg total) by mouth daily.  Dispense: 90 tablet; Refill: 1 - Thyroid Panel With TSH  6. Mixed hyperlipidemia Chronic.  Controlled.  Stable.  Continue simvastatin 20 mg once a day.  Will check lipid panel for current LDL. - simvastatin (ZOCOR) 20 MG tablet; Take 1 tablet (20 mg total) by mouth daily.  Dispense: 90 tablet; Refill: 1 - Lipid Panel With LDL/HDL Ratio  7. Prediabetes Chronic.  Controlled.  Is currently controlled with dietary approach and will check A1c for current status. - HgB A1c  8. Weakness of both lower extremities Patient has a complaint that her legs are giving out when she walks not necessarily having shortness of breath although there is some mild dyspnea.  There is no pain associated with  this patient will return and we will recheck this to see if this is a form of claudication or possibility of a presentation of cardiomyopathy.    Elizabeth Sauer, MD

## 2022-12-22 NOTE — Telephone Encounter (Signed)
Unable to refill per protocol, Rx request is too soon. Last refill 12/22/22 for 90 and 1 refill.  Requested Prescriptions  Pending Prescriptions Disp Refills   clopidogrel (PLAVIX) 75 MG tablet [Pharmacy Med Name: CLOPIDOGREL 75 MG TABLET] 90 tablet 1    Sig: TAKE 1 TABLET BY MOUTH EVERY DAY     Hematology: Antiplatelets - clopidogrel Failed - 12/22/2022  2:25 AM      Failed - HCT in normal range and within 180 days    HCT  Date Value Ref Range Status  08/28/2022 34 (A) 36 - 46 Final   Hematocrit  Date Value Ref Range Status  04/21/2022 37.1 34.0 - 46.6 % Final         Failed - HGB in normal range and within 180 days    Hemoglobin  Date Value Ref Range Status  08/28/2022 11.7 (A) 12.0 - 16.0 Final  04/21/2022 12.3 11.1 - 15.9 g/dL Final         Failed - Cr in normal range and within 360 days    Creatinine, Ser  Date Value Ref Range Status  09/26/2022 1.60 (H) 0.44 - 1.00 mg/dL Final         Passed - PLT in normal range and within 180 days    Platelets  Date Value Ref Range Status  08/28/2022 331 150 - 400 K/uL Final  04/21/2022 263 150 - 450 x10E3/uL Final         Passed - Valid encounter within last 6 months    Recent Outpatient Visits           Today Essential hypertension   [place] [facility], MD   2 months ago Abnormal chest x-ray   [facility], MD   3 months ago Pneumonia of right lower lobe due to infectious organism   [facility], MD   3 months ago Pneumonia of right lower lobe due to infectious organism   [facility] at [facility], MD   4 months ago Prediabetes   [facility] at [facility], MD       Future Appointments             In 4 weeks Duanne Limerick,  MD [facility] at [facility], [facility]   In 6 months Duanne Limerick, MD [facility] Medicine at [facility], [facility]

## 2022-12-23 LAB — RENAL FUNCTION PANEL
Albumin: 5 g/dL — ABNORMAL HIGH (ref 3.7–4.7)
BUN/Creatinine Ratio: 23 (ref 12–28)
BUN: 37 mg/dL — ABNORMAL HIGH (ref 8–27)
CO2: 21 mmol/L (ref 20–29)
Calcium: 10.3 mg/dL (ref 8.7–10.3)
Chloride: 103 mmol/L (ref 96–106)
Creatinine, Ser: 1.64 mg/dL — ABNORMAL HIGH (ref 0.57–1.00)
Glucose: 112 mg/dL — ABNORMAL HIGH (ref 70–99)
Phosphorus: 3.7 mg/dL (ref 3.0–4.3)
Potassium: 3.8 mmol/L (ref 3.5–5.2)
Sodium: 143 mmol/L (ref 134–144)
eGFR: 31 mL/min/{1.73_m2} — ABNORMAL LOW (ref >59)

## 2022-12-23 LAB — LIPID PANEL WITH LDL/HDL RATIO
Cholesterol, Total: 144 mg/dL (ref 100–199)
HDL: 77 mg/dL (ref >39)
LDL Chol Calc (NIH): 54 mg/dL (ref 0–99)
LDL/HDL Ratio: 0.7 ratio (ref 0.0–3.2)
Triglycerides: 65 mg/dL (ref 0–149)
VLDL Cholesterol Cal: 13 mg/dL (ref 5–40)

## 2022-12-23 LAB — HEMOGLOBIN A1C
Est. average glucose Bld gHb Est-mCnc: 117 mg/dL
Hgb A1c MFr Bld: 5.7 % — ABNORMAL HIGH (ref 4.8–5.6)

## 2022-12-23 LAB — THYROID PANEL WITH TSH
Free Thyroxine Index: 2.2 (ref 1.2–4.9)
T3 Uptake Ratio: 28 % (ref 24–39)
T4, Total: 8 ug/dL (ref 4.5–12.0)
TSH: 2.17 u[IU]/mL (ref 0.450–4.500)

## 2023-01-12 DIAGNOSIS — N1832 Chronic kidney disease, stage 3b: Secondary | ICD-10-CM | POA: Diagnosis not present

## 2023-01-12 DIAGNOSIS — R600 Localized edema: Secondary | ICD-10-CM | POA: Diagnosis not present

## 2023-01-12 DIAGNOSIS — N2581 Secondary hyperparathyroidism of renal origin: Secondary | ICD-10-CM | POA: Diagnosis not present

## 2023-01-12 DIAGNOSIS — I129 Hypertensive chronic kidney disease with stage 1 through stage 4 chronic kidney disease, or unspecified chronic kidney disease: Secondary | ICD-10-CM | POA: Diagnosis not present

## 2023-01-12 DIAGNOSIS — I10 Essential (primary) hypertension: Secondary | ICD-10-CM | POA: Diagnosis not present

## 2023-01-14 DIAGNOSIS — I129 Hypertensive chronic kidney disease with stage 1 through stage 4 chronic kidney disease, or unspecified chronic kidney disease: Secondary | ICD-10-CM | POA: Diagnosis not present

## 2023-01-14 DIAGNOSIS — N2581 Secondary hyperparathyroidism of renal origin: Secondary | ICD-10-CM | POA: Diagnosis not present

## 2023-01-14 DIAGNOSIS — N1832 Chronic kidney disease, stage 3b: Secondary | ICD-10-CM | POA: Diagnosis not present

## 2023-01-14 DIAGNOSIS — D631 Anemia in chronic kidney disease: Secondary | ICD-10-CM | POA: Diagnosis not present

## 2023-01-14 DIAGNOSIS — I10 Essential (primary) hypertension: Secondary | ICD-10-CM | POA: Diagnosis not present

## 2023-01-14 DIAGNOSIS — R600 Localized edema: Secondary | ICD-10-CM | POA: Diagnosis not present

## 2023-01-19 ENCOUNTER — Ambulatory Visit (INDEPENDENT_AMBULATORY_CARE_PROVIDER_SITE_OTHER): Payer: Medicare HMO | Admitting: Family Medicine

## 2023-01-19 ENCOUNTER — Encounter: Payer: Self-pay | Admitting: Family Medicine

## 2023-01-19 ENCOUNTER — Ambulatory Visit
Admission: RE | Admit: 2023-01-19 | Discharge: 2023-01-19 | Disposition: A | Discharge status: Home / Self Care | Payer: Medicare HMO | Attending: Family Medicine | Admitting: Family Medicine

## 2023-01-19 ENCOUNTER — Ambulatory Visit
Admission: RE | Admit: 2023-01-19 | Discharge: 2023-01-19 | Disposition: A | Discharge status: Home / Self Care | Payer: Medicare HMO | Source: Ambulatory Visit | Attending: Family Medicine | Admitting: Family Medicine

## 2023-01-19 VITALS — BP 124/70 | HR 62 | Ht 62.0 in | Wt 139.0 lb

## 2023-01-19 DIAGNOSIS — R0609 Other forms of dyspnea: Secondary | ICD-10-CM

## 2023-01-19 DIAGNOSIS — I700 Atherosclerosis of aorta: Secondary | ICD-10-CM | POA: Diagnosis not present

## 2023-01-19 DIAGNOSIS — R9389 Abnormal findings on diagnostic imaging of other specified body structures: Secondary | ICD-10-CM | POA: Insufficient documentation

## 2023-01-19 DIAGNOSIS — M6281 Muscle weakness (generalized): Secondary | ICD-10-CM | POA: Diagnosis not present

## 2023-01-19 DIAGNOSIS — R531 Weakness: Secondary | ICD-10-CM | POA: Diagnosis not present

## 2023-01-19 NOTE — Progress Notes (Signed)
Date:  01/19/2023   Name:  Carly Marshall   DOB:  Jul 18, 1936   MRN:  [id number]   Chief Complaint: Extremity Weakness (Walking- legs "feel weak" which leads to SOB)  Shortness of Breath This is a new problem. The current episode started 1 to 4 weeks ago. The problem occurs intermittently. The problem has been waxing and waning. Pertinent negatives include no abdominal pain, chest pain, coryza, fever, hemoptysis, leg pain, leg swelling, orthopnea, PND or wheezing. The symptoms are aggravated by exercise (exertion). Associated symptoms comments: Sleeps in recliner. She has tried nothing (stopped diuretic) for the symptoms. The treatment provided no relief. There is no history of CAD or a heart failure.    Lab Results  Component Value Date   NA 143 12/22/2022   K 3.8 12/22/2022   CO2 21 12/22/2022   GLUCOSE 112 (H) 12/22/2022   BUN 37 (H) 12/22/2022   CREATININE 1.64 (H) 12/22/2022   CALCIUM 10.3 12/22/2022   EGFR 31 (L) 12/22/2022   GFRNONAA 24 (L) 06/19/2020   Lab Results  Component Value Date   CHOL 144 12/22/2022   HDL 77 12/22/2022   LDLCALC 54 12/22/2022   TRIG 65 12/22/2022   CHOLHDL 2.6 02/08/2019   Lab Results  Component Value Date   TSH 2.170 12/22/2022   Lab Results  Component Value Date   HGBA1C 5.7 (H) 12/22/2022   Lab Results  Component Value Date   WBC 14.6 08/28/2022   HGB 11.7 (A) 08/28/2022   HCT 34 (A) 08/28/2022   MCV 88 04/21/2022   PLT 331 08/28/2022   Lab Results  Component Value Date   ALT 9 04/21/2022   AST 21 04/21/2022   ALKPHOS 127 (H) 04/21/2022   BILITOT 0.6 04/21/2022   No results found for: "25OHVITD2", "25OHVITD3", "VD25OH"   Review of Systems  Constitutional:  Positive for fatigue. Negative for fever.  Respiratory:  Positive for shortness of breath. Negative for hemoptysis, chest tightness and wheezing.   Cardiovascular:  Negative for chest pain, palpitations, orthopnea, leg swelling and PND.  Gastrointestinal:  Negative  for abdominal pain.  Neurological:  Negative for weakness and numbness.    Patient Active Problem List   Diagnosis Date Noted   Edema of lower extremity 08/30/2020   Anemia in chronic kidney disease 08/30/2020   Chronic venous insufficiency 05/11/2020   Aphasia 02/07/2019   Chronic kidney disease, stage 4 (severe) (HCC) 02/07/2019   B12 deficiency 04/28/2016   Essential hypertension 03/27/2015   Hypothyroidism 03/27/2015   Hyperlipidemia 03/27/2015   Allergic rhinitis due to pollen 03/27/2015    No Known Allergies  Past Surgical History:  Procedure Laterality Date   COLONOSCOPY  2012   normal- Dr Henrene Hawking   ECTOPIC PREGNANCY SURGERY      Social History   Tobacco Use   Smoking status: Never   Smokeless tobacco: Never   Tobacco comments:    smoking cessation materials not required  Vaping Use   Vaping Use: Never used  Substance Use Topics   Alcohol use: No    Alcohol/week: 0.0 standard drinks of alcohol   Drug use: No     Medication list has been reviewed and updated.  Current Meds  Medication Sig   albuterol (VENTOLIN HFA) 108 (90 Base) MCG/ACT inhaler INHALE 2 PUFFS 4 TIMES A DAY   amLODipine (NORVASC) 5 MG tablet Take 1 tablet (5 mg total) by mouth 2 (two) times daily.   Calcium Carb-Cholecalciferol (CALCIUM 600 + D  PO) Take 1 capsule by mouth 2 (two) times daily.   Cholecalciferol (VITAMIN D3) 50 MCG (2000 UT) CAPS Take 1 capsule by mouth daily.   clopidogrel (PLAVIX) 75 MG tablet Take 1 tablet (75 mg total) by mouth daily.   cyanocobalamin (VITAMIN B12) 1000 MCG/ML injection INJECT ONCE A MONTH   fluticasone (FLONASE) 50 MCG/ACT nasal spray Place 2 sprays into both nostrils daily.   hydrochlorothiazide (HYDRODIURIL) 12.5 MG tablet Take 1 tablet (12.5 mg total) by mouth daily.   levothyroxine (SYNTHROID) 50 MCG tablet Take 1 tablet (50 mcg total) by mouth daily.   losartan (COZAAR) 50 MG tablet Take 1 tablet (50 mg total) by mouth daily.   Multiple  Vitamins-Minerals (OCUVITE ADULT 50+ PO) Take 1 tablet by mouth daily.   simvastatin (ZOCOR) 20 MG tablet Take 1 tablet (20 mg total) by mouth daily.   vitamin C (ASCORBIC ACID) 500 MG tablet Take 500 mg by mouth daily.       01/19/2023    3:11 PM 12/22/2022    1:28 PM 09/25/2022   10:48 AM 09/22/2022    2:13 PM  GAD 7 : Generalized Anxiety Score  Nervous, Anxious, on Edge 0 0 0 0  Control/stop worrying 0 0 0 0  Worry too much - different things 0 0 0 0  Trouble relaxing 0 0 0 0  Restless 0 0 0 0  Easily annoyed or irritable 0 0 0 0  Afraid - awful might happen 0 0 0 0  Total GAD 7 Score 0 0 0 0  Anxiety Difficulty Not difficult at all Not difficult at all Not difficult at all Not difficult at all       01/19/2023    3:11 PM 12/22/2022    1:28 PM 09/25/2022   10:48 AM  Depression screen PHQ 2/9  Decreased Interest 0 0 0  Down, Depressed, Hopeless 0 0 0  PHQ - 2 Score 0 0 0  Altered sleeping 0 0 0  Tired, decreased energy 0 0 0  Change in appetite 0 0 0  Feeling bad or failure about yourself  0 0 0  Trouble concentrating 0 0 0  Moving slowly or fidgety/restless 0 0 0  Suicidal thoughts 0 0 0  PHQ-9 Score 0 0 0  Difficult doing work/chores Not difficult at all Not difficult at all Not difficult at all    BP Readings from Last 3 Encounters:  01/19/23 124/70  12/22/22 112/74  09/25/22 128/64    Physical Exam Vitals and nursing note reviewed. Exam conducted with a chaperone present.  Constitutional:      General: She is not in acute distress.    Appearance: She is not diaphoretic.  HENT:     Head: Normocephalic and atraumatic.     Right Ear: External ear normal.     Left Ear: External ear normal.     Nose: Nose normal.  Eyes:     General:        Right eye: No discharge.        Left eye: No discharge.     Conjunctiva/sclera: Conjunctivae normal.     Pupils: Pupils are equal, round, and reactive to light.  Neck:     Thyroid: No thyromegaly.     Vascular: No JVD.   Cardiovascular:     Rate and Rhythm: Normal rate and regular rhythm.     Pulses:          Radial pulses are 2+ on the  right side and 2+ on the left side.       Popliteal pulses are 2+ on the right side and 2+ on the left side.       Dorsalis pedis pulses are 1+ on the right side and 1+ on the left side.     Heart sounds: Normal heart sounds. No murmur heard.    No friction rub. No gallop.  Pulmonary:     Effort: Pulmonary effort is normal.     Breath sounds: Rales present. No wheezing or rhonchi.  Abdominal:     General: Bowel sounds are normal.     Palpations: Abdomen is soft. There is no mass.     Tenderness: There is no abdominal tenderness. There is no guarding.  Musculoskeletal:        General: Normal range of motion.     Cervical back: Normal range of motion and neck supple.  Lymphadenopathy:     Cervical: No cervical adenopathy.  Skin:    General: Skin is warm and dry.     Coloration: Jaundice: fine basilar.  Neurological:     Mental Status: She is alert.     Wt Readings from Last 3 Encounters:  01/19/23 139 lb (63 kg)  12/22/22 138 lb (62.6 kg)  09/25/22 134 lb (60.8 kg)    BP 124/70   Pulse 62   Ht 5\' 2"  (1.575 m)   Wt 139 lb (63 kg)   SpO2 99%   BMI 25.42 kg/m   Assessment and Plan:  1. Dyspnea on exertion New onset.  Episodic.  Relatively stable.  Primarily with exertion over walking a distance or doing stairs.  Patient has a previous history of pneumonia with a CT scan.  This was in February and we will repeat a chest x-ray as noted below.  Patient sleeps in a recliner but has tried to sleep in bed and does not have orthopnea or PND but is only able to sleep half at night on the bed because of her back.  Fine rales were noted on auscultation so we will obtain a chest x-ray to see if there is any pleural effusion versus pulmonary edema. - DG Chest 2 View; normal  2. Muscle weakness on examination New onset.  Episodic.  Patient does not have PAD/pain with  walking but legs get tired easily when walking.  Seen that legs are the largest muscle group if there is a decrease in cardiac output on demand this could cause the symptomatology.  Consideration was given to PAD but there is a 2+ dorsalis pedis on examination so I do not think this is circulatory but we will await chest x-ray for determination.  3. Abnormal chest x-ray Patient with abnormal history of chest x-ray will obtain chest x-ray since it has been almost 4 months.  Rule out any adenopathy pleural effusion or edema. - DG Chest 2 View;    Elizabeth Sauer, MD

## 2023-01-26 ENCOUNTER — Other Ambulatory Visit: Payer: Self-pay

## 2023-01-26 DIAGNOSIS — I517 Cardiomegaly: Secondary | ICD-10-CM

## 2023-03-16 ENCOUNTER — Other Ambulatory Visit: Payer: Self-pay

## 2023-03-16 ENCOUNTER — Inpatient Hospital Stay
Admission: EM | Admit: 2023-03-16 | Discharge: 2023-03-17 | DRG: 308 | Disposition: A | Discharge status: Other Acute Inpatient Hospital | Payer: Medicare HMO | Attending: Internal Medicine | Admitting: Internal Medicine

## 2023-03-16 ENCOUNTER — Telehealth: Payer: Self-pay | Admitting: *Deleted

## 2023-03-16 ENCOUNTER — Ambulatory Visit: Payer: Self-pay | Admitting: *Deleted

## 2023-03-16 ENCOUNTER — Encounter: Payer: Self-pay | Admitting: Emergency Medicine

## 2023-03-16 ENCOUNTER — Emergency Department: Payer: Medicare HMO

## 2023-03-16 ENCOUNTER — Telehealth: Payer: Self-pay

## 2023-03-16 DIAGNOSIS — N179 Acute kidney failure, unspecified: Secondary | ICD-10-CM | POA: Diagnosis not present

## 2023-03-16 DIAGNOSIS — I442 Atrioventricular block, complete: Secondary | ICD-10-CM | POA: Diagnosis not present

## 2023-03-16 DIAGNOSIS — I440 Atrioventricular block, first degree: Secondary | ICD-10-CM | POA: Diagnosis not present

## 2023-03-16 DIAGNOSIS — E039 Hypothyroidism, unspecified: Secondary | ICD-10-CM | POA: Diagnosis present

## 2023-03-16 DIAGNOSIS — I5031 Acute diastolic (congestive) heart failure: Secondary | ICD-10-CM | POA: Diagnosis present

## 2023-03-16 DIAGNOSIS — I509 Heart failure, unspecified: Secondary | ICD-10-CM

## 2023-03-16 DIAGNOSIS — Z7902 Long term (current) use of antithrombotics/antiplatelets: Secondary | ICD-10-CM

## 2023-03-16 DIAGNOSIS — Z83438 Family history of other disorder of lipoprotein metabolism and other lipidemia: Secondary | ICD-10-CM

## 2023-03-16 DIAGNOSIS — I441 Atrioventricular block, second degree: Secondary | ICD-10-CM | POA: Diagnosis not present

## 2023-03-16 DIAGNOSIS — R001 Bradycardia, unspecified: Principal | ICD-10-CM

## 2023-03-16 DIAGNOSIS — R54 Age-related physical debility: Secondary | ICD-10-CM | POA: Diagnosis present

## 2023-03-16 DIAGNOSIS — Z8261 Family history of arthritis: Secondary | ICD-10-CM | POA: Diagnosis not present

## 2023-03-16 DIAGNOSIS — M545 Low back pain, unspecified: Secondary | ICD-10-CM | POA: Diagnosis not present

## 2023-03-16 DIAGNOSIS — Z808 Family history of malignant neoplasm of other organs or systems: Secondary | ICD-10-CM

## 2023-03-16 DIAGNOSIS — E785 Hyperlipidemia, unspecified: Secondary | ICD-10-CM | POA: Diagnosis not present

## 2023-03-16 DIAGNOSIS — Z79890 Hormone replacement therapy (postmenopausal): Secondary | ICD-10-CM | POA: Diagnosis not present

## 2023-03-16 DIAGNOSIS — Z8673 Personal history of transient ischemic attack (TIA), and cerebral infarction without residual deficits: Secondary | ICD-10-CM | POA: Diagnosis not present

## 2023-03-16 DIAGNOSIS — G8929 Other chronic pain: Secondary | ICD-10-CM | POA: Diagnosis not present

## 2023-03-16 DIAGNOSIS — D631 Anemia in chronic kidney disease: Secondary | ICD-10-CM | POA: Diagnosis not present

## 2023-03-16 DIAGNOSIS — R600 Localized edema: Secondary | ICD-10-CM | POA: Diagnosis present

## 2023-03-16 DIAGNOSIS — J984 Other disorders of lung: Secondary | ICD-10-CM | POA: Diagnosis not present

## 2023-03-16 DIAGNOSIS — I10 Essential (primary) hypertension: Secondary | ICD-10-CM | POA: Diagnosis present

## 2023-03-16 DIAGNOSIS — I495 Sick sinus syndrome: Secondary | ICD-10-CM | POA: Diagnosis present

## 2023-03-16 DIAGNOSIS — I679 Cerebrovascular disease, unspecified: Secondary | ICD-10-CM | POA: Diagnosis not present

## 2023-03-16 DIAGNOSIS — I110 Hypertensive heart disease with heart failure: Secondary | ICD-10-CM | POA: Diagnosis not present

## 2023-03-16 DIAGNOSIS — I130 Hypertensive heart and chronic kidney disease with heart failure and stage 1 through stage 4 chronic kidney disease, or unspecified chronic kidney disease: Secondary | ICD-10-CM | POA: Diagnosis not present

## 2023-03-16 DIAGNOSIS — R918 Other nonspecific abnormal finding of lung field: Secondary | ICD-10-CM | POA: Diagnosis not present

## 2023-03-16 DIAGNOSIS — I129 Hypertensive chronic kidney disease with stage 1 through stage 4 chronic kidney disease, or unspecified chronic kidney disease: Secondary | ICD-10-CM | POA: Diagnosis not present

## 2023-03-16 DIAGNOSIS — I499 Cardiac arrhythmia, unspecified: Secondary | ICD-10-CM | POA: Diagnosis not present

## 2023-03-16 DIAGNOSIS — N1832 Chronic kidney disease, stage 3b: Secondary | ICD-10-CM | POA: Diagnosis not present

## 2023-03-16 DIAGNOSIS — Z833 Family history of diabetes mellitus: Secondary | ICD-10-CM | POA: Diagnosis not present

## 2023-03-16 DIAGNOSIS — N189 Chronic kidney disease, unspecified: Secondary | ICD-10-CM | POA: Diagnosis present

## 2023-03-16 DIAGNOSIS — J309 Allergic rhinitis, unspecified: Secondary | ICD-10-CM | POA: Diagnosis not present

## 2023-03-16 DIAGNOSIS — Z79899 Other long term (current) drug therapy: Secondary | ICD-10-CM

## 2023-03-16 DIAGNOSIS — I517 Cardiomegaly: Secondary | ICD-10-CM | POA: Diagnosis not present

## 2023-03-16 DIAGNOSIS — R9431 Abnormal electrocardiogram [ECG] [EKG]: Secondary | ICD-10-CM | POA: Diagnosis not present

## 2023-03-16 DIAGNOSIS — N183 Chronic kidney disease, stage 3 unspecified: Secondary | ICD-10-CM | POA: Diagnosis not present

## 2023-03-16 DIAGNOSIS — Z8249 Family history of ischemic heart disease and other diseases of the circulatory system: Secondary | ICD-10-CM

## 2023-03-16 DIAGNOSIS — Z905 Acquired absence of kidney: Secondary | ICD-10-CM | POA: Diagnosis not present

## 2023-03-16 DIAGNOSIS — R079 Chest pain, unspecified: Secondary | ICD-10-CM | POA: Diagnosis not present

## 2023-03-16 LAB — BASIC METABOLIC PANEL
Anion gap: 10 (ref 5–15)
BUN: 33 mg/dL — ABNORMAL HIGH (ref 8–23)
CO2: 23 mmol/L (ref 22–32)
Calcium: 9.3 mg/dL (ref 8.9–10.3)
Chloride: 106 mmol/L (ref 98–111)
Creatinine, Ser: 1.9 mg/dL — ABNORMAL HIGH (ref 0.44–1.00)
GFR, Estimated: 26 mL/min — ABNORMAL LOW (ref >60)
Glucose, Bld: 153 mg/dL — ABNORMAL HIGH (ref 70–99)
Potassium: 4.6 mmol/L (ref 3.5–5.1)
Sodium: 139 mmol/L (ref 135–145)

## 2023-03-16 LAB — TROPONIN I (HIGH SENSITIVITY)
Troponin I (High Sensitivity): 20 ng/L — ABNORMAL HIGH (ref <18)
Troponin I (High Sensitivity): 20 ng/L — ABNORMAL HIGH (ref <18)

## 2023-03-16 LAB — CBC
HCT: 34.6 % — ABNORMAL LOW (ref 36.0–46.0)
Hemoglobin: 10.7 g/dL — ABNORMAL LOW (ref 12.0–15.0)
MCH: 29.5 pg (ref 26.0–34.0)
MCHC: 30.9 g/dL (ref 30.0–36.0)
MCV: 95.3 fL (ref 80.0–100.0)
Platelets: 233 10*3/uL (ref 150–400)
RBC: 3.63 MIL/uL — ABNORMAL LOW (ref 3.87–5.11)
RDW: 14.1 % (ref 11.5–15.5)
WBC: 6.3 10*3/uL (ref 4.0–10.5)
nRBC: 0 % (ref 0.0–0.2)

## 2023-03-16 LAB — MAGNESIUM: Magnesium: 2.5 mg/dL — ABNORMAL HIGH (ref 1.7–2.4)

## 2023-03-16 LAB — TSH: TSH: 2.448 u[IU]/mL (ref 0.350–4.500)

## 2023-03-16 LAB — T4, FREE: Free T4: 1.14 ng/dL — ABNORMAL HIGH (ref 0.61–1.12)

## 2023-03-16 MED ORDER — ACETAMINOPHEN 325 MG PO TABS: 650.0000 mg | ORAL_TABLET | Freq: Four times a day (QID) | ORAL | Status: DC | PRN

## 2023-03-16 MED ORDER — ACETAMINOPHEN 650 MG RE SUPP: 650.0000 mg | Freq: Four times a day (QID) | RECTAL | Status: DC | PRN

## 2023-03-16 MED ORDER — HEPARIN SODIUM (PORCINE) 5000 UNIT/ML IJ SOLN
5000.0000 [IU] | Freq: Three times a day (TID) | INTRAMUSCULAR | Status: AC
  Administered 2023-03-16: 5000 [IU] via SUBCUTANEOUS
  Filled 2023-03-16: qty 1

## 2023-03-16 MED ORDER — VITAMIN D 25 MCG (1000 UNIT) PO TABS
2000.0000 [IU] | ORAL_TABLET | Freq: Every day | ORAL | Status: DC
  Administered 2023-03-17: 2000 [IU] via ORAL
  Filled 2023-03-16: qty 2

## 2023-03-16 MED ORDER — SIMVASTATIN 10 MG PO TABS: 20.0000 mg | ORAL_TABLET | Freq: Every day | ORAL | Status: DC

## 2023-03-16 MED ORDER — ONDANSETRON HCL 4 MG/2ML IJ SOLN: 4.0000 mg | Freq: Four times a day (QID) | INTRAMUSCULAR | Status: DC | PRN

## 2023-03-16 MED ORDER — ONDANSETRON HCL 4 MG PO TABS: 4.0000 mg | ORAL_TABLET | Freq: Four times a day (QID) | ORAL | Status: DC | PRN

## 2023-03-16 MED ORDER — CLOPIDOGREL BISULFATE 75 MG PO TABS
75.0000 mg | ORAL_TABLET | Freq: Every day | ORAL | Status: DC
  Administered 2023-03-17: 75 mg via ORAL
  Filled 2023-03-16: qty 1

## 2023-03-16 MED ORDER — SENNOSIDES-DOCUSATE SODIUM 8.6-50 MG PO TABS: 1.0000 | ORAL_TABLET | Freq: Every evening | ORAL | Status: DC | PRN

## 2023-03-16 MED ORDER — FUROSEMIDE 10 MG/ML IJ SOLN
40.0000 mg | Freq: Once | INTRAMUSCULAR | Status: AC
  Administered 2023-03-16: 40 mg via INTRAVENOUS
  Filled 2023-03-16: qty 4

## 2023-03-16 MED ORDER — AMLODIPINE BESYLATE 5 MG PO TABS
5.0000 mg | ORAL_TABLET | Freq: Every day | ORAL | Status: DC
  Administered 2023-03-17: 5 mg via ORAL
  Filled 2023-03-16: qty 1

## 2023-03-16 MED ORDER — LEVOTHYROXINE SODIUM 50 MCG PO TABS
50.0000 ug | ORAL_TABLET | Freq: Every day | ORAL | Status: DC
  Administered 2023-03-17: 50 ug via ORAL
  Filled 2023-03-16: qty 1

## 2023-03-16 NOTE — Telephone Encounter (Signed)
[employer] PA calling with concern- HR 33- new onset, stable- no symptoms   PA states possible new onset Bradycardia-would like office follow up for patient

## 2023-03-16 NOTE — Assessment & Plan Note (Signed)
Baseline hemoglobin is 11.7-12 Continue to monitor

## 2023-03-16 NOTE — Assessment & Plan Note (Signed)
[facility] cardiology service has been consulted by EDP Per EDP, Dr. Okey Dupre states that the patient will be seen in that they will involve EP specialist for possible pacemaker placement Strict I's and O's Admit to telemetry cardiac, inpatient

## 2023-03-16 NOTE — Telephone Encounter (Signed)
  Chief Complaint: hypotension  Symptoms: PA from insurance is calling to report patient has very low pulse reading without any other symptoms- she has advised her to contact office. Patient has called and recheck is still low for BP and pulse. Patient states she has noticed some changes in breathing  and possible fatigue- but no other problems. Patient advised she needs to be seen and she agrees to go to ED for evaluation- recommended patient have someone drive her Frequency: unknown Pertinent Negatives: Patient denies SOB, chest pain, dizziness Disposition: [x] ED /[] Urgent Care (no appt availability in office) / [] Appointment(In office/virtual)/ []  [place] [facility]/ [] Home Care/ [] Refused Recommended Disposition /[] [place] [facility]/ []  Follow-up with PCP Additional Notes: Patient has been advised ED

## 2023-03-16 NOTE — Assessment & Plan Note (Signed)
Mildly elevated at 2.5 Patient takes magnesium 400 mg p.o. daily, we will hold magnesium supplementation on admission Recheck magnesium level in a.m.

## 2023-03-16 NOTE — Telephone Encounter (Signed)
Reason for Disposition  Heart beating very slowly (e.g., < 50 / minute)  (Exception: Athlete and heart rate normal for caller.)  Answer Assessment - Initial Assessment Questions 1. DESCRIPTION: "Please describe your heart rate or heartbeat that you are having" (e.g., fast/slow, regular/irregular, skipped or extra beats, "palpitations")     no 2. ONSET: "When did it start?" (Minutes, hours or days)      No- last visit at PCP- June- pulse was 76 3. DURATION: "How long does it last" (e.g., seconds, minutes, hours)     133/37, P48- arm- automatic- 11:10,  4. PATTERN "Does it come and go, or has it been constant since it started?"  "Does it get worse with exertion?"   "Are you feeling it now?"     Never - normally high BP 5. TAP: "Using your hand, can you tap out what you are feeling on a chair or table in front of you, so that I can hear?" (Note: not all patients can do this)       na 6. HEART RATE: "Can you tell me your heart rate?" "How many beats in 15 seconds?"  (Note: not all patients can do this)         7. RECURRENT SYMPTOM: "Have you ever had this before?" If Yes, ask: "When was the last time?" and "What happened that time?"      no 8. CAUSE: "What do you think is causing the palpitations?"     unsure 9. CARDIAC HISTORY: "Do you have any history of heart disease?" (e.g., heart attack, angina, bypass surgery, angioplasty, arrhythmia)      Hypertension 8/26- cardiology appointment 10. OTHER SYMPTOMS: "Do you have any other symptoms?" (e.g., dizziness, chest pain, sweating, difficulty breathing)       Fatigue, breathing is different  Protocols used: Heart Rate and Heartbeat Questions-A-AH

## 2023-03-16 NOTE — Assessment & Plan Note (Signed)
-   Simvastatin 20 mg daily resumed 

## 2023-03-16 NOTE — Telephone Encounter (Signed)
  Chief Complaint: Pt called in because her home health nurse from the insurance company was just there and told her her pulse was 33 and to call her doctor.   Symptoms: No symptoms.   Was told her pulse was 95 at one point and 33 at another.   I was trying to find out why the discrepancy.   She went to get her BP machine and check her BP but never came back to the phone. Frequency: She doesn't know how long she has had the low pulse until the nurse told her this morning. Pertinent Negatives: Patient denies feeling dizzy, lightheaded, weak, no chest discomfort.   Does have some shortness of breath that she is seeing a cardiologist for at the end of August. Disposition: [x] ED /[] Urgent Care (no appt availability in office) / [] Appointment(In office/virtual)/ []  [place] [facility]/ [] Home Care/ [] Refused Recommended Disposition /[] [place] [facility]/ []  Follow-up with PCP Additional Notes: She never came back to the phone after going to check her BP. I attempted to call her back 4 times, twice on cell number and twice on home phone.   I got a message from another triage nurse she she was talking with the pt now.   The triage nurse is referring her to the ED.

## 2023-03-16 NOTE — Telephone Encounter (Signed)
Answer Assessment - Initial Assessment Questions 1. DESCRIPTION: "Please describe your heart rate or heartbeat that you are having" (e.g., fast/slow, regular/irregular, skipped or extra beats, "palpitations")     Home Health nurse from my insurance company came out and my pulse was 33.   She told me to call my doctor.    She thought it might be from my medications. 2. ONSET: "When did it start?" (Minutes, hours or days)      No symptoms.    No recent illnesses.    I have sinus congestion but that's all. 3. DURATION: "How long does it last" (e.g., seconds, minutes, hours)     I do have some shortness of breath.   I have a heart doctor appt  at the end of the month.    No history of cardiac problems. 4. PATTERN "Does it come and go, or has it been constant since it started?"  "Does it get worse with exertion?"   "Are you feeling it now?"     I don't know.  I've never been told my heart rate was low.   5. TAP: "Using your hand, can you tap out what you are feeling on a chair or table in front of you, so that I can hear?" (Note: not all patients can do this)        I asked her to take her BP with her BP machine.  She mentioned the batteries might be low because she doesn't check her BP very often.   She said she would get her machine and check it.    She never came back to the phone after waiting 5 minutes.    I hung up and called her back.  I got her answering machine so I hung up and called right back trying to get her. I ended up leaving her a voicemail to call the office back and left the number.  I tried to call her back a 3rd time and still got her answering machine again.    I left her another message to call Dr. Yetta Barre' office and left the number again.      The number I'm calling is from her chart [phone].   I tried her home number [phone] and got a different answering machine so I left her a message on her home phone too.    A another triage nurse sent me a message that she is  talking with the pt. Now.  She is being sent to the ED.  6. HEART RATE: "Can you tell me your heart rate?" "How many beats in 15 seconds?"  (Note: not all patients can do this)       My pulse was 33 this morning from the thing on my finger the home health nurse used.    She took my BP and it was 130/58 this morning.   My pulse was 95.  I asked her about the pulse discrepancy.    The nurse said my pulse was 33.   "I don't know what the discrepancy is".  I let her know if her pulse was 95 why did the nurse tell her it was 33.   How did the nurse check her pulse?   "She did it with my BP and with that thing on my finger".   I rechecked with her how the nurse got her pulse reading because that is a big discrepancy.   The pt said,   "I don't know".   "She  just told me my pulse was 33 and to call my doctor which is why I called".        7. RECURRENT SYMPTOM: "Have you ever had this before?" If Yes, ask: "When was the last time?" and "What happened that time?"      No 8. CAUSE: "What do you think is causing the palpitations?"     I don't know.      I only took my medicine an hour before the home health nurse took my VS.  "Could that do it"? 9. CARDIAC HISTORY: "Do you have any history of heart disease?" (e.g., heart attack, angina, bypass surgery, angioplasty, arrhythmia)      No but I have a cardiology appt at the end of this month because I've been having some shortness of breath.   10. OTHER SYMPTOMS: "Do you have any other symptoms?" (e.g., dizziness, chest pain, sweating, difficulty breathing)       Denies dizziness, feeling lightheaded, weak, no chest discomfort.   "I do have the shortness of breath I'm seeing the cardiologist at the end of August. 11. PREGNANCY: "Is there any chance you are pregnant?" "When was your last menstrual period?"       N/A due to age.  Protocols used: Heart Rate and Heartbeat Questions-A-AH

## 2023-03-16 NOTE — Assessment & Plan Note (Signed)
Baseline hemoglobin is 1.58-1.60/eGFR 32 Status post furosemide 40 mg IV one-time dose per EDP Strict I's and O's Check BMP in a.m.

## 2023-03-16 NOTE — Assessment & Plan Note (Signed)
Mild AKI, query secondary to heart failure exacerbation/cardiorenal Strict I's and O's Status post furosemide 40 mg IV per EDP Recheck BMP in a.m.

## 2023-03-16 NOTE — Telephone Encounter (Signed)
Pt advised to go to ED. ? ?KP ?

## 2023-03-16 NOTE — Assessment & Plan Note (Signed)
-   Amlodipine 5 mg daily resumed 

## 2023-03-16 NOTE — ED Provider Notes (Signed)
[facility] Provider Note    Event Date/Time   First MD Initiated Contact with Patient 03/16/23 1502     (approximate)   History   Chief Complaint Low Heart Rate   HPI  Carly Marshall is a 87 y.o. female with past medical history of hypertension, hyperlipidemia, hypothyroidism, stroke, CKD, and anemia who presents to the ED complaining of low heart rate.  Patient reports that she had a checkup with a nurse from her insurance company earlier today and was told that her heart rate was in the 30s.  She states that she has had some mild difficulty breathing for the past couple of weeks, but otherwise denies any symptoms.  She specifically denies any chest pain, lightheadedness, or dizziness.  She denies any significant cardiac history, but does state that she is scheduled to see a cardiologist later this month after chest x-ray found a "enlarged heart."     Physical Exam   Triage Vital Signs: ED Triage Vitals  Encounter Vitals Group     BP 03/16/23 1439 (!) 110/99     Systolic BP Percentile --      Diastolic BP Percentile --      Pulse Rate 03/16/23 1439 (!) 36     Resp 03/16/23 1439 18     Temp 03/16/23 1439 98.1 F (36.7 C)     Temp Source 03/16/23 1439 Oral     SpO2 03/16/23 1439 95 %     Weight 03/16/23 1440 139 lb (63 kg)     Height 03/16/23 1440 5\' 2"  (1.575 m)     Head Circumference --      Peak Flow --      Pain Score 03/16/23 1440 0     Pain Loc --      Pain Education --      Exclude from Growth Chart --     Most recent vital signs: Vitals:   03/16/23 1535 03/16/23 1600  BP: (!) 145/97 (!) 158/55  Pulse: (!) 36 (!) 35  Resp: (!) 24 17  Temp:    SpO2: 100% 98%    Constitutional: Alert and oriented. Eyes: Conjunctivae are normal. Head: Atraumatic. Nose: No congestion/rhinnorhea. Mouth/Throat: Mucous membranes are moist.  Cardiovascular: Bradycardic, regular rhythm. Grossly normal heart sounds.  2+ radial pulses  bilaterally. Respiratory: Normal respiratory effort.  No retractions. Lungs CTAB. Gastrointestinal: Soft and nontender. No distention. Musculoskeletal: No lower extremity tenderness nor edema.  Neurologic:  Normal speech and language. No gross focal neurologic deficits are appreciated.    ED Results / Procedures / Treatments   Labs (all labs ordered are listed, but only abnormal results are displayed) Labs Reviewed  BASIC METABOLIC PANEL - Abnormal; Notable for the following components:      Result Value   Glucose, Bld 153 (*)    BUN 33 (*)    Creatinine, Ser 1.90 (*)    GFR, Estimated 26 (*)    All other components within normal limits  CBC - Abnormal; Notable for the following components:   RBC 3.63 (*)    Hemoglobin 10.7 (*)    HCT 34.6 (*)    All other components within normal limits  MAGNESIUM - Abnormal; Notable for the following components:   Magnesium 2.5 (*)    All other components within normal limits  T4, FREE - Abnormal; Notable for the following components:   Free T4 1.14 (*)    All other components within normal limits  TROPONIN I (HIGH  SENSITIVITY) - Abnormal; Notable for the following components:   Troponin I (High Sensitivity) 20 (*)    All other components within normal limits  TSH  TROPONIN I (HIGH SENSITIVITY)     EKG  ED ECG REPORT I, [name], the attending physician, personally viewed and interpreted this ECG.   Date: 03/16/2023  EKG Time: 14:42  Rate: 37  Rhythm: sinus bradycardia  Axis: LAD  Intervals:none  ST&T Change: None  RADIOLOGY Chest x-ray reviewed and interpreted by me with cardiomegaly and pulmonary edema.  PROCEDURES:  Critical Care performed: Yes, see critical care procedure note(s)  .Critical Care  Performed by: [name], MD Authorized by: [name], MD   Critical care provider statement:    Critical care time (minutes):  30   Critical care time was exclusive of:  Teaching time and separately  billable procedures and treating other patients   Critical care was necessary to treat or prevent imminent or life-threatening deterioration of the following conditions:  Cardiac failure   Critical care was time spent personally by me on the following activities:  Development of treatment plan with patient or surrogate, discussions with consultants, evaluation of patient's response to treatment, examination of patient, ordering and review of laboratory studies, ordering and review of radiographic studies, ordering and performing treatments and interventions, pulse oximetry, re-evaluation of patient's condition and review of old charts   I assumed direction of critical care for this patient from another provider in my specialty: no     Care discussed with: admitting provider      MEDICATIONS ORDERED IN ED: Medications  furosemide (LASIX) injection 40 mg (40 mg Intravenous Given 03/16/23 1632)     IMPRESSION / MDM / ASSESSMENT AND PLAN / ED COURSE  I reviewed the triage vital signs and the nursing notes.                              87 y.o. female with past medical history of hypertension, hyperlipidemia, stroke, hypothyroidism, CKD, and anemia who presents to the ED complaining of low heart rate noted during routine follow-up visit.  Patient's presentation is most consistent with acute presentation with potential threat to life or bodily function.  Differential diagnosis includes, but is not limited to, heart block, arrhythmia, electrolyte abnormality, anemia, AKI, hypothyroidism.  Patient nontoxic-appearing and in no acute distress, vital signs remarkable for bradycardia but otherwise reassuring with stable blood pressure.  EKG shows sinus bradycardia with no signs of heart block or ischemia, patient does not appear to be on any AV nodal blocking agents.  Labs are pending at this time, will observe on cardiac monitor and discuss with cardiology.  Labs show AKI without acute electrolyte  abnormality, troponin mildly elevated but low suspicion for ACS at this time and we will trend.  No significant anemia or leukocytosis noted.  Chest x-ray does show cardiomegaly with pulmonary edema that is likely contributing to patient's difficulty breathing and we will diurese with IV Lasix, blood pressure remains stable at this time.  Case discussed with Dr. Okey Dupre of cardiology, who recommends admission to the hospital service for cardiac monitoring and EP evaluation.  Case discussed with hospitalist for admission.      FINAL CLINICAL IMPRESSION(S) / ED DIAGNOSES   Final diagnoses:  Bradycardia  Acute congestive heart failure, unspecified heart failure type (HCC)     Rx / DC Orders   ED Discharge Orders     None  Note:  This document was prepared using Dragon voice recognition software and may include unintentional dictation errors.   [name], MD 03/16/23 [phone]

## 2023-03-16 NOTE — Assessment & Plan Note (Signed)
-   Levothyroxine 50 mcg daily before breakfast resumed

## 2023-03-16 NOTE — Hospital Course (Signed)
Ms. Carly Marshall is a 87 year old female with history of hypertension, hypothyroid, hyperlipidemia, CKD stage IIIb, who presents to the emergency department for chief concerns of shortness of breath and low heart rate.  Vitals in the ED showed temperature of 98.1, respiration rate of 15, heart rate of 42, with multiple times in the 30s on telemetry in the ED, blood pressure 129/64, SpO2 of 100% on room air.  Serum sodium is 139, potassium 4.6, chloride 106, bicarb 23, BUN of 33, serum creatinine of 1.90, EGFR 26, nonfasting blood glucose 153, WBC 6.3, hemoglobin 10.7, platelets of 233.  High sensitive troponin was 20.  ED treatment: Furosemide 40 mg IV one-time dose.  EDP discussed patient with cardiologist on-call Dr. Okey Dupre who recommends hospital admission and cardiology service will consult with EP consultation for consideration of pacemaker placement.

## 2023-03-16 NOTE — ED Notes (Signed)
Ordered patient's meal tray.

## 2023-03-16 NOTE — ED Triage Notes (Signed)
Patient to ED via POV for low heart rate. Patient was being checked out by insurance RN and HR showed 33. Pt stating SOB and "jittery feeling." Denies CP

## 2023-03-16 NOTE — H&P (Signed)
History and Physical   Carly Marshall UUV:[id number] DOB: 1935/12/10 DOA: 03/16/2023  PCP: Carly Limerick, MD  Outpatient Specialists: Dr. Thedore Mins, nephrology Patient coming from: home  I have personally briefly reviewed patient's old medical records in [facility] EMR.  Chief Concern: low heart rate, weakness  HPI: Carly Marshall is a 87 year old female with history of hypertension, hypothyroid, hyperlipidemia, CKD stage IIIb, who presents to the emergency department for chief concerns of shortness of breath and low heart rate.  Vitals in the ED showed temperature of 98.1, respiration rate of 15, heart rate of 42, with multiple times in the 30s on telemetry in the ED, blood pressure 129/64, SpO2 of 100% on room air.  Serum sodium is 139, potassium 4.6, chloride 106, bicarb 23, BUN of 33, serum creatinine of 1.90, EGFR 26, nonfasting blood glucose 153, WBC 6.3, hemoglobin 10.7, platelets of 233.  High sensitive troponin was 20.  ED treatment: Furosemide 40 mg IV one-time dose.  EDP discussed patient with cardiologist on-call Dr. Okey Dupre who recommends hospital admission and cardiology service will consult with EP consultation for consideration of pacemaker placement. -------------------------------- At bedside, patient was able to tell me her name, age, location, current calendar year.  She was able to identify her daughter at bedside.  She reports that today, she developed shortness of breath and tiredness.  It was noted with her insurance carrier that she had a low heart rate today.  She denies feeling this way before.  She denies chest pain.  She endorses shortness of breath with exertion.  She reports that normally she is able to ambulate from room to room without difficulty however over the last month, she has had weakness and shortness of breath.  She denies significant swelling of her lower extremities.  She denies fever, chills, syncope, loss of conscious, dysuria, hematuria,  diarrhea.  Social history: She lives at home.  She denies tobacco, EtOH, recreational drug use.  ROS: Constitutional: no weight change, no fever ENT/Mouth: no sore throat, no rhinorrhea Eyes: no eye pain, no vision changes Cardiovascular: no chest pain, + dyspnea,  no edema, no palpitations Respiratory: no cough, no sputum, no wheezing Gastrointestinal: no nausea, no vomiting, no diarrhea, no constipation Genitourinary: no urinary incontinence, no dysuria, no hematuria Musculoskeletal: no arthralgias, no myalgias Skin: no skin lesions, no pruritus, Neuro: + weakness, no loss of consciousness, no syncope Psych: no anxiety, no depression, + decrease appetite Heme/Lymph: no bruising, no bleeding  ED Course: Discussed with emergency medicine provider, patient requiring hospitalization for chief concerns of symptomatic bradycardia.  Assessment/Plan  Principal Problem:   Symptomatic bradycardia Active Problems:   Essential hypertension   Hypothyroidism   Hyperlipidemia   Edema of lower extremity   Anemia in chronic kidney disease   CKD stage 3b, GFR 30-44 ml/min (HCC)   Hypermagnesemia   AKI (acute kidney injury) (HCC)   Assessment and Plan:  * Symptomatic bradycardia [facility] cardiology service has been consulted by EDP Per EDP, Dr. Okey Dupre states that the patient will be seen in that they will involve EP specialist for possible pacemaker placement Strict I's and O's Admit to telemetry cardiac, inpatient  AKI (acute kidney injury) (HCC) Mild AKI, query secondary to heart failure exacerbation/cardiorenal Strict I's and O's Status post furosemide 40 mg IV per EDP Recheck BMP in a.m.  Hypermagnesemia Mildly elevated at 2.5 Patient takes magnesium 400 mg p.o. daily, we will hold magnesium supplementation on admission Recheck magnesium level in a.m.  CKD stage 3b,  GFR 30-44 ml/min (HCC) Baseline hemoglobin is 1.58-1.60/eGFR 32 Status post furosemide 40 mg IV one-time dose per  EDP Strict I's and O's Check BMP in a.m.  Anemia in chronic kidney disease Baseline hemoglobin is 11.7-12 Continue to monitor  Hyperlipidemia Simvastatin 20 mg daily resumed  Hypothyroidism Levothyroxine 50 mcg daily before breakfast resumed  Essential hypertension Amlodipine 5 mg daily resumed  Chart reviewed.   DVT prophylaxis: Heparin 5000 units subcutaneous, one-time dose ordered.  AM team to reinitiate pharmacologic DVT prophylaxis when the benefits outweigh the risk. Code Status: Full code Diet: Heart healthy diet Family Communication: Updated daughter at bedside, Carly Marshall with patient's permission Disposition Plan: Pending cardiology evaluation Consults called: Cardiology service, Dr. Okey Dupre consulted by EDP Admission status: Telemetry cardiac, inpatient  Past Medical History:  Diagnosis Date   Allergy    B12 deficiency anemia    Hyperlipidemia    Hypertension    Stroke [facility])    Thyroid disease    Past Surgical History:  Procedure Laterality Date   COLONOSCOPY  2012   normal- Dr Henrene Hawking   ECTOPIC PREGNANCY SURGERY     Social History:  reports that she has never smoked. She has never used smokeless tobacco. She reports that she does not drink alcohol and does not use drugs.  No Known Allergies Family History  Problem Relation Age of Onset   Diabetes Mother    Heart disease Mother    Fibromyalgia Mother    Heart disease Father    Hyperlipidemia Father    Hypertension Father    Diabetes Sister    Brain cancer Brother    Rheum arthritis Sister    Family history: Family history reviewed and not pertinent.  Prior to Admission medications   Medication Sig Start Date End Date Taking? Authorizing Provider  albuterol (VENTOLIN HFA) 108 (90 Base) MCG/ACT inhaler INHALE 2 PUFFS 4 TIMES A DAY 08/28/22  Yes [provider]  amLODipine (NORVASC) 5 MG tablet Take 1 tablet (5 mg total) by mouth 2 (two) times daily. 12/22/22  Yes Carly Limerick, MD  Calcium  Carb-Cholecalciferol (CALCIUM 600 + D PO) Take 1 capsule by mouth 2 (two) times daily.   Yes [provider]  Cholecalciferol (VITAMIN D3) 50 MCG (2000 UT) CAPS Take 1 capsule by mouth daily.   Yes [provider]  clopidogrel (PLAVIX) 75 MG tablet Take 1 tablet (75 mg total) by mouth daily. 12/22/22  Yes Carly Limerick, MD  cyanocobalamin (VITAMIN B12) 1000 MCG/ML injection INJECT ONCE A MONTH 12/22/22  Yes Carly Limerick, MD  fluticasone (FLONASE) 50 MCG/ACT nasal spray Place 2 sprays into both nostrils daily. 12/22/22  Yes Carly Limerick, MD  hydrochlorothiazide (HYDRODIURIL) 12.5 MG tablet Take 1 tablet (12.5 mg total) by mouth daily. 12/22/22  Yes Carly Limerick, MD  levothyroxine (SYNTHROID) 50 MCG tablet Take 1 tablet (50 mcg total) by mouth daily. 12/22/22  Yes Carly Limerick, MD  losartan (COZAAR) 50 MG tablet Take 1 tablet (50 mg total) by mouth daily. 12/22/22  Yes Carly Limerick, MD  Multiple Vitamins-Minerals (OCUVITE ADULT 50+ PO) Take 1 tablet by mouth daily.   Yes [provider]  simvastatin (ZOCOR) 20 MG tablet Take 1 tablet (20 mg total) by mouth daily. 12/22/22  Yes Carly Limerick, MD  vitamin C (ASCORBIC ACID) 500 MG tablet Take 500 mg by mouth daily.   Yes [provider]   Physical Exam: Vitals:   03/16/23 1600 03/16/23 1700  03/16/23 1730 03/16/23 1800  BP: (!) 158/55 (!) 144/61 (!) 132/97 (!) 149/52  Pulse: (!) 35 (!) 43 (!) 52 (!) 57  Resp: 17   (!) 23  Temp:      TempSrc:      SpO2: 98% 99% 100% 98%  Weight:      Height:       Constitutional: appears frail, NAD, calm Eyes: PERRL, lids and conjunctivae normal ENMT: Mucous membranes are moist. Posterior pharynx clear of any exudate or lesions. Age-appropriate dentition. Hearing appropriate Neck: normal, supple, no masses, no thyromegaly Respiratory: clear to auscultation bilaterally, no wheezing, no crackles. Normal respiratory effort. No accessory muscle use.   Cardiovascular: Bradycardia, regular rhythm, no murmurs / rubs / gallops.  Bilateral 1 + lower extremity pitting edema. 2+ pedal pulses. No carotid bruits.  Abdomen: no tenderness, no masses palpated, no hepatosplenomegaly. Bowel sounds positive.  Musculoskeletal: no clubbing / cyanosis. No joint deformity upper and lower extremities. Good ROM, no contractures, no atrophy. Normal muscle tone.  Skin: no rashes, lesions, ulcers. No induration Neurologic: Sensation intact. Strength 5/5 in all 4.  Psychiatric: Normal judgment and insight. Alert and oriented x 3. Normal mood.   EKG: independently reviewed, showing sinus bradycardia with rate of 37, first-degree AV block, QTc 368  Chest x-ray on Admission: I personally reviewed and I agree with radiologist reading as below.  DG Chest 2 View  Result Date: 03/16/2023 CLINICAL DATA:  Chest pain EXAM: CHEST - 2 VIEW COMPARISON:  01/19/2023 FINDINGS: Transverse diameter of heart is increased. Central pulmonary vessels are more prominent. There are no signs of alveolar pulmonary edema. No new focal pulmonary consolidation is seen. Small linear densities are seen in left lower lung field. There is no pleural effusion or pneumothorax. There is decrease in height of few lower thoracic and upper lumbar vertebral bodies, possibly old compression fractures. IMPRESSION: Cardiomegaly. Central pulmonary vessels are more prominent suggesting possible mild CHF. There are no signs of alveolar pulmonary edema or focal pulmonary consolidation. Small linear densities in left lower lung field may suggest scarring or subsegmental atelectasis. Electronically Signed   By: Ernie Avena M.D.   On: 03/16/2023 15:39    Labs on Admission: I have personally reviewed following labs  CBC: Recent Labs  Lab 03/16/23 1444  WBC 6.3  HGB 10.7*  HCT 34.6*  MCV 95.3  PLT 233   Basic Metabolic Panel: Recent Labs  Lab 03/16/23 1444  NA 139  K 4.6  CL 106  CO2 23  GLUCOSE  153*  BUN 33*  CREATININE 1.90*  CALCIUM 9.3  MG 2.5*   GFR: Estimated Creatinine Clearance: 19.2 mL/min (A) (by C-G formula based on SCr of 1.9 mg/dL (H)).  Thyroid Function Tests: Recent Labs    03/16/23 1444  TSH 2.448  FREET4 1.14*   This document was prepared using Dragon Voice Recognition software and may include unintentional dictation errors.  Dr. Sedalia Muta [facility]  If 7PM-7AM, please contact overnight-coverage provider If 7AM-7PM, please contact day attending provider [email or web address]  03/16/2023, 6:34 PM

## 2023-03-16 NOTE — Telephone Encounter (Signed)
Called pt back- her heart rate is still in the 30s but her daughter had went ahead and took her to ER instead of waiting on our call. She is undergoing eval now

## 2023-03-17 ENCOUNTER — Inpatient Hospital Stay (HOSPITAL_COMMUNITY)
Admission: RE | Admit: 2023-03-17 | Discharge: 2023-03-19 | DRG: 243 | Disposition: A | Discharge status: Home / Self Care | Payer: Medicare HMO | Source: Other Acute Inpatient Hospital | Attending: Cardiovascular Disease | Admitting: Cardiovascular Disease

## 2023-03-17 ENCOUNTER — Encounter (HOSPITAL_COMMUNITY): Payer: Self-pay

## 2023-03-17 DIAGNOSIS — I495 Sick sinus syndrome: Secondary | ICD-10-CM | POA: Diagnosis not present

## 2023-03-17 DIAGNOSIS — Z8673 Personal history of transient ischemic attack (TIA), and cerebral infarction without residual deficits: Secondary | ICD-10-CM | POA: Diagnosis not present

## 2023-03-17 DIAGNOSIS — Z79899 Other long term (current) drug therapy: Secondary | ICD-10-CM | POA: Diagnosis not present

## 2023-03-17 DIAGNOSIS — Z7902 Long term (current) use of antithrombotics/antiplatelets: Secondary | ICD-10-CM | POA: Diagnosis not present

## 2023-03-17 DIAGNOSIS — N179 Acute kidney failure, unspecified: Secondary | ICD-10-CM | POA: Diagnosis not present

## 2023-03-17 DIAGNOSIS — Z79890 Hormone replacement therapy (postmenopausal): Secondary | ICD-10-CM | POA: Diagnosis not present

## 2023-03-17 DIAGNOSIS — N183 Chronic kidney disease, stage 3 unspecified: Secondary | ICD-10-CM | POA: Diagnosis not present

## 2023-03-17 DIAGNOSIS — I441 Atrioventricular block, second degree: Secondary | ICD-10-CM | POA: Diagnosis not present

## 2023-03-17 DIAGNOSIS — E039 Hypothyroidism, unspecified: Secondary | ICD-10-CM | POA: Diagnosis present

## 2023-03-17 DIAGNOSIS — I129 Hypertensive chronic kidney disease with stage 1 through stage 4 chronic kidney disease, or unspecified chronic kidney disease: Secondary | ICD-10-CM | POA: Diagnosis not present

## 2023-03-17 DIAGNOSIS — E785 Hyperlipidemia, unspecified: Secondary | ICD-10-CM | POA: Diagnosis present

## 2023-03-17 DIAGNOSIS — I442 Atrioventricular block, complete: Secondary | ICD-10-CM | POA: Diagnosis not present

## 2023-03-17 DIAGNOSIS — Z905 Acquired absence of kidney: Secondary | ICD-10-CM

## 2023-03-17 DIAGNOSIS — R001 Bradycardia, unspecified: Secondary | ICD-10-CM | POA: Diagnosis not present

## 2023-03-17 LAB — SURGICAL PCR SCREEN
MRSA, PCR: NEGATIVE
Staphylococcus aureus: NEGATIVE

## 2023-03-17 MED ORDER — CEFAZOLIN SODIUM-DEXTROSE 2-4 GM/100ML-% IV SOLN: 2.0000 g | INTRAVENOUS | Status: AC

## 2023-03-17 MED ORDER — SODIUM CHLORIDE 0.9% FLUSH
3.0000 mL | Freq: Two times a day (BID) | INTRAVENOUS | Status: DC
  Administered 2023-03-17 – 2023-03-19 (×3): 3 mL via INTRAVENOUS

## 2023-03-17 MED ORDER — SIMVASTATIN 20 MG PO TABS
20.0000 mg | ORAL_TABLET | Freq: Every day | ORAL | Status: DC
  Administered 2023-03-17 – 2023-03-18 (×2): 20 mg via ORAL
  Filled 2023-03-17 (×2): qty 1

## 2023-03-17 MED ORDER — HYDRALAZINE HCL 50 MG PO TABS: 25.0000 mg | ORAL_TABLET | Freq: Three times a day (TID) | ORAL | Status: DC | PRN

## 2023-03-17 MED ORDER — HYDRALAZINE HCL 25 MG PO TABS
25.0000 mg | ORAL_TABLET | Freq: Three times a day (TID) | ORAL | Status: DC | PRN
  Administered 2023-03-19: 25 mg via ORAL
  Filled 2023-03-17: qty 1

## 2023-03-17 MED ORDER — SODIUM CHLORIDE 0.9% FLUSH: 3.0000 mL | INTRAVENOUS | Status: DC | PRN

## 2023-03-17 MED ORDER — CHLORHEXIDINE GLUCONATE 4 % EX SOLN
60.0000 mL | Freq: Once | CUTANEOUS | Status: DC
  Filled 2023-03-17: qty 60

## 2023-03-17 MED ORDER — ACETAMINOPHEN 325 MG PO TABS
650.0000 mg | ORAL_TABLET | ORAL | Status: DC | PRN
  Administered 2023-03-18 – 2023-03-19 (×2): 650 mg via ORAL
  Filled 2023-03-17 (×2): qty 2

## 2023-03-17 MED ORDER — SODIUM CHLORIDE 0.9 % IV SOLN
80.0000 mg | INTRAVENOUS | Status: AC
  Filled 2023-03-17: qty 2

## 2023-03-17 MED ORDER — LEVOTHYROXINE SODIUM 50 MCG PO TABS
50.0000 ug | ORAL_TABLET | Freq: Every day | ORAL | Status: DC
  Administered 2023-03-18 – 2023-03-19 (×2): 50 ug via ORAL
  Filled 2023-03-17 (×2): qty 1

## 2023-03-17 MED ORDER — ORAL CARE MOUTH RINSE: 15.0000 mL | OROMUCOSAL | Status: DC | PRN

## 2023-03-17 MED ORDER — SODIUM CHLORIDE 0.9 % IV SOLN: INTRAVENOUS | Status: DC

## 2023-03-17 MED ORDER — SODIUM CHLORIDE 0.9 % IV SOLN: 250.0000 mL | INTRAVENOUS | Status: DC

## 2023-03-17 MED ORDER — CHLORHEXIDINE GLUCONATE 4 % EX SOLN
60.0000 mL | Freq: Once | CUTANEOUS | Status: AC
  Administered 2023-03-18: 4 via TOPICAL

## 2023-03-17 MED ORDER — VITAMIN D 25 MCG (1000 UNIT) PO TABS
1000.0000 [IU] | ORAL_TABLET | Freq: Every day | ORAL | Status: DC
  Administered 2023-03-17 – 2023-03-19 (×3): 1000 [IU] via ORAL
  Filled 2023-03-17 (×3): qty 1

## 2023-03-17 MED ORDER — SENNA 8.6 MG PO TABS
1.0000 | ORAL_TABLET | Freq: Every day | ORAL | Status: DC
  Administered 2023-03-17 – 2023-03-18 (×2): 8.6 mg via ORAL
  Filled 2023-03-17 (×2): qty 1

## 2023-03-17 NOTE — H&P (Incomplete)
Cardiology Admission History and Physical   Patient ID: Carly Marshall MRN: [id number]; DOB: Jun 30, 1936   Admission date: (Not on file)  PCP:  Duanne Limerick, MD   [facility] Providers Cardiologist: NONE currently; Has appt with Dr. Mariah Milling Aug 2024 to establish care None   Chief Complaint:  SOB, fatigue  Patient Profile:   Carly Marshall is a 87 y.o. female with PMH HTN, hypothyroid, HLD, CKD stage 3b, T2DM, CVA who is being seen 03/17/2023 for the evaluation of bradycardia and SOB.  History of Present Illness:   Carly Marshall had a routine checkup yesterday with a nurse from her insurance company yesterday and was noted to be bradycardic to the 30s. She had complained of SOB with increased fatigue for the past month, and so presented to [facility] ER for further evaluation.  While in ER, initial EKG with marked sinus bradycardia, and concern for CHB. She has remained hemodynamically stable. Labs notable for slight AKI on CKD with Cr up to 2, baseline 1.6-1.8. TSH WNL.  She was diagnosed with PNA earlier this year and follow-up chest x-ray revealed cardiomegaly. She was referred to cardiology for further eval, but has not establish care yet, appt at the end of August.  Updated TTE pending.   She denies palpitations, chest pain, chest pressure. No syncope or presyncope. Has good appetite.  Daughter at beside   Past Medical History:  Diagnosis Date   Allergy    B12 deficiency anemia    Hyperlipidemia    Hypertension    Stroke [facility])    Thyroid disease     Past Surgical History:  Procedure Laterality Date   COLONOSCOPY  2012   normal- Dr Henrene Hawking   ECTOPIC PREGNANCY SURGERY       Medications Prior to Admission: Prior to Admission medications   Medication Sig Start Date End Date Taking? Authorizing Provider  albuterol (VENTOLIN HFA) 108 (90 Base) MCG/ACT inhaler INHALE 2 PUFFS 4 TIMES A DAY 08/28/22   [provider]  amLODipine (NORVASC) 5 MG tablet Take 1  tablet (5 mg total) by mouth 2 (two) times daily. 12/22/22   Duanne Limerick, MD  Calcium Carb-Cholecalciferol (CALCIUM 600 + D PO) Take 1 capsule by mouth 2 (two) times daily.    [provider]  Cholecalciferol (VITAMIN D3) 50 MCG (2000 UT) CAPS Take 1 capsule by mouth daily.    [provider]  clopidogrel (PLAVIX) 75 MG tablet Take 1 tablet (75 mg total) by mouth daily. 12/22/22   Duanne Limerick, MD  cyanocobalamin (VITAMIN B12) 1000 MCG/ML injection INJECT ONCE A MONTH 12/22/22   Duanne Limerick, MD  fluticasone (FLONASE) 50 MCG/ACT nasal spray Place 2 sprays into both nostrils daily. 12/22/22   Duanne Limerick, MD  hydrochlorothiazide (HYDRODIURIL) 12.5 MG tablet Take 1 tablet (12.5 mg total) by mouth daily. 12/22/22   Duanne Limerick, MD  levothyroxine (SYNTHROID) 50 MCG tablet Take 1 tablet (50 mcg total) by mouth daily. 12/22/22   Duanne Limerick, MD  losartan (COZAAR) 50 MG tablet Take 1 tablet (50 mg total) by mouth daily. 12/22/22   Duanne Limerick, MD  Multiple Vitamins-Minerals (OCUVITE ADULT 50+ PO) Take 1 tablet by mouth daily.    [provider]  simvastatin (ZOCOR) 20 MG tablet Take 1 tablet (20 mg total) by mouth daily. 12/22/22   Duanne Limerick, MD  vitamin C (ASCORBIC ACID) 500 MG tablet Take 500 mg by mouth daily.  [provider]     Allergies:   No Known Allergies  Social History:   Social History   Socioeconomic History   Marital status: Widowed    Spouse name: Not on file   Number of children: 2   Years of education: Not on file   Highest education level: 12th grade  Occupational History   Occupation: Retired   Occupation: Insurance risk surveyor: [employer]  Tobacco Use   Smoking status: Never   Smokeless tobacco: Never   Tobacco comments:    smoking cessation materials not required  Vaping Use   Vaping status: Never Used  Substance and Sexual Activity   Alcohol use: No    Alcohol/week: 0.0 standard  drinks of alcohol   Drug use: No   Sexual activity: Not Currently  Other Topics Concern   Not on file  Social History Narrative   Husband passed away May 28, 2019  Social Determinants of Health   Financial Resource Strain: Low Risk  (03/26/2022)   Overall Financial Resource Strain (CARDIA)    Difficulty of Paying Living Expenses: Not hard at all  Food Insecurity: No Food Insecurity (03/26/2022)   Hunger Vital Sign    Worried About Running Out of Food in the Last Year: Never true    Ran Out of Food in the Last Year: Never true  Transportation Needs: No Transportation Needs (03/26/2022)   PRAPARE - Administrator, Civil Service (Medical): No    Lack of Transportation (Non-Medical): No  Physical Activity: Sufficiently Active (03/26/2022)   Exercise Vital Sign    Days of Exercise per Week: 7 days    Minutes of Exercise per Session: 30 min  Stress: No Stress Concern Present (03/26/2022)   [employer] of Occupational Health - Occupational Stress Questionnaire    Feeling of Stress : Not at all  Social Connections: Moderately Integrated (03/26/2022)   Social Connection and Isolation Panel [NHANES]    Frequency of Communication with Friends and Family: More than three times a week    Frequency of Social Gatherings with Friends and Family: More than three times a week    Attends Religious Services: More than 4 times per year    Active Member of [employer] or Organizations: Yes    Attends Banker Meetings: More than 4 times per year    Marital Status: Widowed  Intimate Partner Violence: Not At Risk (03/25/2021)   Humiliation, Afraid, Rape, and Kick questionnaire    Fear of Current or Ex-Partner: No    Emotionally Abused: No    Physically Abused: No    Sexually Abused: No    Family History:   The patient's family history includes Brain cancer in her brother; Diabetes in her mother and sister; Fibromyalgia in her mother; Heart disease in her father and mother;  Hyperlipidemia in her father; Hypertension in her father; Rheum arthritis in her sister.    ROS:  Please see the history of present illness.  All other ROS reviewed and negative.     Physical Exam/Data:  There were no vitals filed for this visit. No intake or output data in the 24 hours ending 03/17/23 1110    03/16/2023    2:40 PM 01/19/2023    3:09 PM 12/22/2022    1:25 PM  Last 3 Weights  Weight (lbs) 139 lb 139 lb 138 lb  Weight (kg) 63.05 kg 63.05 kg 62.596 kg     There is no height or  weight on file to calculate BMI.  General:  Well nourished, well developed, in no acute distress HEENT: normal Neck: no JVD Vascular: No carotid bruits; Distal pulses 2+ bilaterally   Cardiac:  bradycardic; no murmur  Lungs:  clear to auscultation bilaterally, no wheezing, rhonchi or rales  Abd: soft, nontender, no hepatomegaly  Ext: no edema Musculoskeletal:  No deformities, BUE and BLE strength normal and equal Skin: warm and dry  Neuro:  CNs 2-12 intact, no focal abnormalities noted Psych:  Normal affect    EKG:  The ECG that was done 03/16/2023 was personally reviewed and demonstrates sinus bradycardia, rate 37. Low voltage EKG. One blocked P-wave   Relevant CV Studies:  TTE, 02/08/2019  1. The left ventricle has normal systolic function with an ejection fraction of 60-65%. The cavity size was normal. There is moderately increased left ventricular wall thickness. Left ventricular diastolic parameters were normal.   2. The right ventricle has normal systolic function. The cavity was normal. There is no increase in right ventricular wall thickness.   3. Mitral valve regurgitation is mild to moderate by color flow Doppler.   4. The aortic valve is tricuspid. Aortic valve regurgitation was not assessed by color flow Doppler.   Laboratory Data:  High Sensitivity Troponin:   Recent Labs  Lab 03/16/23 1444 03/16/23 1632  TROPONINIHS 20* 20*      Chemistry Recent Labs  Lab 03/16/23 1444  03/17/23 0539  NA 139 140  K 4.6 3.8  CL 106 105  CO2 23 24  GLUCOSE 153* 107*  BUN 33* 32*  CREATININE 1.90* 2.03*  CALCIUM 9.3 9.2  MG 2.5* 2.2  GFRNONAA 26* 24*  ANIONGAP 10 11    No results for input(s): "PROT", "ALBUMIN", "AST", "ALT", "ALKPHOS", "BILITOT" in the last 168 hours. Lipids No results for input(s): "CHOL", "TRIG", "HDL", "LABVLDL", "LDLCALC", "CHOLHDL" in the last 168 hours. Hematology Recent Labs  Lab 03/16/23 1444 03/17/23 0539  WBC 6.3 5.8  RBC 3.63* 3.82*  HGB 10.7* 11.4*  HCT 34.6* 35.8*  MCV 95.3 93.7  MCH 29.5 29.8  MCHC 30.9 31.8  RDW 14.1 13.9  PLT 233 244   Thyroid  Recent Labs  Lab 03/16/23 1444  TSH 2.448  FREET4 1.14*   BNPNo results for input(s): "BNP", "PROBNP" in the last 168 hours.  DDimer No results for input(s): "DDIMER" in the last 168 hours.   Radiology/Studies:  DG Chest 2 View  Result Date: 03/16/2023 CLINICAL DATA:  Chest pain EXAM: CHEST - 2 VIEW COMPARISON:  01/19/2023 FINDINGS: Transverse diameter of heart is increased. Central pulmonary vessels are more prominent. There are no signs of alveolar pulmonary edema. No new focal pulmonary consolidation is seen. Small linear densities are seen in left lower lung field. There is no pleural effusion or pneumothorax. There is decrease in height of few lower thoracic and upper lumbar vertebral bodies, possibly old compression fractures. IMPRESSION: Cardiomegaly. Central pulmonary vessels are more prominent suggesting possible mild CHF. There are no signs of alveolar pulmonary edema or focal pulmonary consolidation. Small linear densities in left lower lung field may suggest scarring or subsegmental atelectasis. Electronically Signed   By: Ernie Avena M.D.   On: 03/16/2023 15:39     Assessment and Plan:   #) SND #) fatigue #) SOB #) hypothyroid Presented to ER with marked bradycardia, c/o fatigue and SOB x 1 month TSH WNL No BB prior to admission Per 01/19/23 PCP note,  patient is taking 5mg  amlodipine BID.  Will hold all CCB - adjust to hydral PRN for blood pressure control Pending updated TTE  Briefly discussed PPM indication with patient and daughter, and need to transfer to [facility] for implant. They are agreeable with transfer and proceeding   Risk Assessment/Risk Scores:  {Complete the following score calculators/questions to meet required metrics.  Press F2:1}         Code Status: Full Code  Severity of Illness: The appropriate patient status for this patient is INPATIENT. Inpatient status is judged to be reasonable and necessary in order to provide the required intensity of service to ensure the patient's safety. The patient's presenting symptoms, physical exam findings, and initial radiographic and laboratory data in the context of their chronic comorbidities is felt to place them at high risk for further clinical deterioration. Furthermore, it is not anticipated that the patient will be medically stable for discharge from the hospital within 2 midnights of admission.   * I certify that at the point of admission it is my clinical judgment that the patient will require inpatient hospital care spanning beyond 2 midnights from the point of admission due to high intensity of service, high risk for further deterioration and high frequency of surveillance required.*   For questions or updates, please contact  [facility] Please consult [email or web address] for contact info under     Signed, Sherie Don, NP  03/17/2023 11:10 AM

## 2023-03-17 NOTE — TOC Initial Note (Addendum)
Transition of Care [facility]) - Initial/Assessment Note    Patient Details  Name: Carly Marshall MRN: [id number] Date of Birth: Jan 11, 1936  Transition of Care [facility]) CM/SW Contact:    Carly Haven, RN Phone Number: 03/17/2023, 4:25 PM  Clinical Narrative:                 From home alone, pta ambulates with walker, she states she is indep, she has PCP, Carly Marshall, she has insurance on file.  She states she currently does not have any HH services in place , she states a Nurse comes to see her once a year from [place],  she has a walker.  She states her daughter, Carly Marshall will transport her home at dc and she is her support system, she gets her [facility] [place].    Expected Discharge Plan: Home/Self Care Barriers to Discharge: Continued Medical Work up   Patient Goals and CMS Choice Patient states their goals for this hospitalization and ongoing recovery are:: return home   Choice offered to / list presented to : NA      Expected Discharge Plan and Services In-house Referral: NA Discharge Planning Services: CM Consult Post Acute Care Choice: NA Living arrangements for the past 2 months: Single Family Home                 DME Arranged: N/A DME Agency: NA       HH Arranged: NA          Prior Living Arrangements/Services Living arrangements for the past 2 months: Single Family Home Lives with:: Self Patient language and need for interpreter reviewed:: Yes Do you feel safe going back to the place where you live?: Yes      Need for Family Participation in Patient Care: Yes (Comment) Care giver support system in place?: Yes (comment) Current home services: DME (walker) Criminal Activity/Legal Involvement Pertinent to Current Situation/Hospitalization: No - Comment as needed  Activities of Daily Living      Permission Sought/Granted Permission sought to share information with : Case Manager Permission granted to share information with :  Yes, Verbal Permission Granted              Emotional Assessment   Attitude/Demeanor/Rapport: Engaged Affect (typically observed): Appropriate Orientation: : Oriented to Self, Oriented to Place, Oriented to  Time, Oriented to Situation Alcohol / Substance Use: Not Applicable Psych Involvement: No (comment)  Admission diagnosis:  Sinus node dysfunction (HCC) [I49.5] Patient Active Problem List   Diagnosis Date Noted   Sinus node dysfunction (HCC) 03/17/2023   Symptomatic bradycardia 03/16/2023   CKD stage 3b, GFR 30-44 ml/min (HCC) 03/16/2023   Hypermagnesemia 03/16/2023   AKI (acute kidney injury) (HCC) 03/16/2023   Edema of lower extremity 08/30/2020   Anemia in chronic kidney disease 08/30/2020   Chronic venous insufficiency 05/11/2020   Aphasia 02/07/2019   Chronic kidney disease, stage 4 (severe) (HCC) 02/07/2019   B12 deficiency 04/28/2016   Essential hypertension 03/27/2015   Hypothyroidism 03/27/2015   Hyperlipidemia 03/27/2015   Allergic rhinitis due to pollen 03/27/2015   PCP:  Carly Limerick, MD Pharmacy:   [phone] [place] [place] - [address] [place] [address] Phone: [phone] Fax: [phone]     Social Determinants of Health (SDOH) Social History: SDOH Screenings   Food Insecurity: No Food Insecurity (03/26/2022)  Housing: Low Risk  (03/26/2022)  Transportation Needs: No Transportation Needs (03/26/2022)  Alcohol  Screen: Low Risk  (03/26/2022)  Depression (PHQ2-9): Low Risk  (01/19/2023)  Financial Resource Strain: Low Risk  (03/26/2022)  Physical Activity: Sufficiently Active (03/26/2022)  Social Connections: Moderately Integrated (03/26/2022)  Stress: No Stress Concern Present (03/26/2022)  Tobacco Use: Low Risk  (03/16/2023)   SDOH Interventions:     Readmission Risk Interventions     No data to display

## 2023-03-17 NOTE — Consult Note (Signed)
Cardiology Consultation   Patient ID: [name] MRN: [id number]; DOB: 1935-12-04  Admit date: 03/16/2023 Date of Consult: 03/17/2023  PCP:  Carly Limerick, MD   [facility] Providers Cardiologist: New  Patient Profile:   Carly Marshall is a 87 y.o. female with a hx of HTN, HLD, hypothyroidism, h/o TIA, CKD stage 3 with one kidney, anemia who is being seen 03/17/2023 for the evaluation of bradycardia at the request of Dr. Joylene Igo.  History of Present Illness:   Carly Marshall has no significant cardiac history. She denies tobacco, alcohol or drug history. Not on rate lowering medications.   Echo in 2020 for TIA showed LVEF 60-65%, normal RVSF, mlid to mod MR.   The patient presented to the ER 03/15/23 with low heart rate. She had a check up with a nurse from an insurance company and was told her heart rate was in the 30s. She reported weakness and shortness of breath the last few weeks. She denies chest pain, fever, chills, pre-syncope, lightheadedness, and dizziness.   In the ER BP 110/99, pulse 36bpm, afebrile, RR 18. Labs showed BG 153, Scr 1.9, BUN 33, Hgb 10.7, Mag 2.5, FT4 1.14, HS trop20. EKG showed sinus bradycardia, 37bpm. CXR showed mild CHF. The patient was given IV lasix and admitted.   Past Medical History:  Diagnosis Date   Allergy    B12 deficiency anemia    Hyperlipidemia    Hypertension    Stroke [facility])    Thyroid disease     Past Surgical History:  Procedure Laterality Date   COLONOSCOPY  2012   normal- Dr Henrene Hawking   ECTOPIC PREGNANCY SURGERY       Home Medications:  Prior to Admission medications   Medication Sig Start Date End Date Taking? Authorizing Provider  albuterol (VENTOLIN HFA) 108 (90 Base) MCG/ACT inhaler INHALE 2 PUFFS 4 TIMES A DAY 08/28/22  Yes [provider]  amLODipine (NORVASC) 5 MG tablet Take 1 tablet (5 mg total) by mouth 2 (two) times daily. 12/22/22  Yes Carly Limerick, MD  Calcium Carb-Cholecalciferol (CALCIUM 600  + D PO) Take 1 capsule by mouth 2 (two) times daily.   Yes [provider]  Cholecalciferol (VITAMIN D3) 50 MCG (2000 UT) CAPS Take 1 capsule by mouth daily.   Yes [provider]  clopidogrel (PLAVIX) 75 MG tablet Take 1 tablet (75 mg total) by mouth daily. 12/22/22  Yes Carly Limerick, MD  cyanocobalamin (VITAMIN B12) 1000 MCG/ML injection INJECT ONCE A MONTH 12/22/22  Yes Carly Limerick, MD  fluticasone (FLONASE) 50 MCG/ACT nasal spray Place 2 sprays into both nostrils daily. 12/22/22  Yes Carly Limerick, MD  hydrochlorothiazide (HYDRODIURIL) 12.5 MG tablet Take 1 tablet (12.5 mg total) by mouth daily. 12/22/22  Yes Carly Limerick, MD  levothyroxine (SYNTHROID) 50 MCG tablet Take 1 tablet (50 mcg total) by mouth daily. 12/22/22  Yes Carly Limerick, MD  losartan (COZAAR) 50 MG tablet Take 1 tablet (50 mg total) by mouth daily. 12/22/22  Yes Carly Limerick, MD  Multiple Vitamins-Minerals (OCUVITE ADULT 50+ PO) Take 1 tablet by mouth daily.   Yes [provider]  simvastatin (ZOCOR) 20 MG tablet Take 1 tablet (20 mg total) by mouth daily. 12/22/22  Yes Carly Limerick, MD  vitamin C (ASCORBIC ACID) 500 MG tablet Take 500 mg by mouth daily.   Yes [provider]    Inpatient Medications: Scheduled Meds:  amLODipine  5 mg Oral Daily   cholecalciferol  2,000 Units Oral Daily   clopidogrel  75 mg Oral Daily   levothyroxine  50 mcg Oral QAC breakfast   simvastatin  20 mg Oral q1800   Continuous Infusions:  PRN Meds: acetaminophen **OR** acetaminophen, ondansetron **OR** ondansetron (ZOFRAN) IV, senna-docusate  Allergies:   No Known Allergies  Social History:   Social History   Socioeconomic History   Marital status: Widowed    Spouse name: Not on file   Number of children: 2   Years of education: Not on file   Highest education level: 12th grade  Occupational History   Occupation: Retired   Occupation: Insurance risk surveyor: [employer]  Readiness  Tobacco Use   Smoking status: Never   Smokeless tobacco: Never   Tobacco comments:    smoking cessation materials not required  Vaping Use   Vaping status: Never Used  Substance and Sexual Activity   Alcohol use: No    Alcohol/week: 0.0 standard drinks of alcohol   Drug use: No   Sexual activity: Not Currently  Other Topics Concern   Not on file  Social History Narrative   Husband passed away 2019/05/26  Social Determinants of Health   Financial Resource Strain: Low Risk  (03/26/2022)   Overall Financial Resource Strain (CARDIA)    Difficulty of Paying Living Expenses: Not hard at all  Food Insecurity: No Food Insecurity (03/26/2022)   Hunger Vital Sign    Worried About Running Out of Food in the Last Year: Never true    Ran Out of Food in the Last Year: Never true  Transportation Needs: No Transportation Needs (03/26/2022)   PRAPARE - Administrator, Civil Service (Medical): No    Lack of Transportation (Non-Medical): No  Physical Activity: Sufficiently Active (03/26/2022)   Exercise Vital Sign    Days of Exercise per Week: 7 days    Minutes of Exercise per Session: 30 min  Stress: No Stress Concern Present (03/26/2022)   [employer] of Occupational Health - Occupational Stress Questionnaire    Feeling of Stress : Not at all  Social Connections: Moderately Integrated (03/26/2022)   Social Connection and Isolation Panel [NHANES]    Frequency of Communication with Friends and Family: More than three times a week    Frequency of Social Gatherings with Friends and Family: More than three times a week    Attends Religious Services: More than 4 times per year    Active Member of [employer] or Organizations: Yes    Attends Banker Meetings: More than 4 times per year    Marital Status: Widowed  Intimate Partner Violence: Not At Risk (03/25/2021)   Humiliation, Afraid, Rape, and Kick questionnaire    Fear of Current or Ex-Partner: No     Emotionally Abused: No    Physically Abused: No    Sexually Abused: No    Family History:    Family History  Problem Relation Age of Onset   Diabetes Mother    Heart disease Mother    Fibromyalgia Mother    Heart disease Father    Hyperlipidemia Father    Hypertension Father    Diabetes Sister    Brain cancer Brother    Rheum arthritis Sister      ROS:  Please see the history of present illness.   All other ROS reviewed and negative.     Physical Exam/Data:   Vitals:   03/17/23  0315 03/17/23 0400 03/17/23 0407 03/17/23 0430  BP:  (!) 140/56    Pulse: 64 60  (!) 59  Resp: 14 16  16   Temp:   98.2 F (36.8 C)   TempSrc:   Oral   SpO2: 100% 100%  100%  Weight:      Height:       No intake or output data in the 24 hours ending 03/17/23 0736    03/16/2023    2:40 PM 01/19/2023    3:09 PM 12/22/2022    1:25 PM  Last 3 Weights  Weight (lbs) 139 lb 139 lb 138 lb  Weight (kg) 63.05 kg 63.05 kg 62.596 kg     Body mass index is 25.42 kg/m.  General:  Well nourished, well developed, in no acute distress HEENT: normal Neck: no JVD Vascular: No carotid bruits; Distal pulses 2+ bilaterally Cardiac:  normal S1, S2; bradycardia, RR; no murmur  Lungs:  clear to auscultation bilaterally, no wheezing, rhonchi or rales  Abd: soft, nontender, no hepatomegaly  Ext: no edema Musculoskeletal:  No deformities, BUE and BLE strength normal and equal Skin: warm and dry  Neuro:  CNs 2-12 intact, no focal abnormalities noted Psych:  Normal affect   EKG:  The EKG was personally reviewed and demonstrates:  SB 37bpm, 1st degree AV block, PRI, intermittent 2:1 heart block Telemetry:  Telemetry was personally reviewed and demonstrates:  SB H 30-40s, Type 2 heart block  Relevant CV Studies:  Echo 2020 1. The left ventricle has normal systolic function with an ejection  fraction of 60-65%. The cavity size was normal. There is moderately  increased left ventricular wall thickness.  Left ventricular diastolic  parameters were normal.   2. The right ventricle has normal systolic function. The cavity was  normal. There is no increase in right ventricular wall thickness.   3. Mitral valve regurgitation is mild to moderate by color flow Doppler.   4. The aortic valve is tricuspid. Aortic valve regurgitation was not  assessed by color flow Doppler.   Laboratory Data:  High Sensitivity Troponin:   Recent Labs  Lab 03/16/23 1444 03/16/23 1632  TROPONINIHS 20* 20*     Chemistry Recent Labs  Lab 03/16/23 1444 03/17/23 0539  NA 139 140  K 4.6 3.8  CL 106 105  CO2 23 24  GLUCOSE 153* 107*  BUN 33* 32*  CREATININE 1.90* 2.03*  CALCIUM 9.3 9.2  MG 2.5* 2.2  GFRNONAA 26* 24*  ANIONGAP 10 11    No results for input(s): "PROT", "ALBUMIN", "AST", "ALT", "ALKPHOS", "BILITOT" in the last 168 hours. Lipids No results for input(s): "CHOL", "TRIG", "HDL", "LABVLDL", "LDLCALC", "CHOLHDL" in the last 168 hours.  Hematology Recent Labs  Lab 03/16/23 1444 03/17/23 0539  WBC 6.3 5.8  RBC 3.63* 3.82*  HGB 10.7* 11.4*  HCT 34.6* 35.8*  MCV 95.3 93.7  MCH 29.5 29.8  MCHC 30.9 31.8  RDW 14.1 13.9  PLT 233 244   Thyroid  Recent Labs  Lab 03/16/23 1444  TSH 2.448  FREET4 1.14*    BNPNo results for input(s): "BNP", "PROBNP" in the last 168 hours.  DDimer No results for input(s): "DDIMER" in the last 168 hours.   Radiology/Studies:  DG Chest 2 View  Result Date: 03/16/2023 CLINICAL DATA:  Chest pain EXAM: CHEST - 2 VIEW COMPARISON:  01/19/2023 FINDINGS: Transverse diameter of heart is increased. Central pulmonary vessels are more prominent. There are no signs of alveolar pulmonary edema. No  new focal pulmonary consolidation is seen. Small linear densities are seen in left lower lung field. There is no pleural effusion or pneumothorax. There is decrease in height of few lower thoracic and upper lumbar vertebral bodies, possibly old compression fractures. IMPRESSION:  Cardiomegaly. Central pulmonary vessels are more prominent suggesting possible mild CHF. There are no signs of alveolar pulmonary edema or focal pulmonary consolidation. Small linear densities in left lower lung field may suggest scarring or subsegmental atelectasis. Electronically Signed   By: Ernie Avena M.D.   On: 03/16/2023 15:39     Assessment and Plan:   Symptomatic bradycardia - presented with bradycardia with HR into the 30s. She reports weakness and SOB for the last month - PTA not on rate lowering medication - tele shows bradycardia with intermittent wenckebach and 2:1 heart block - TSH wnl - Keep Mag>2 and K>4 - check an echo - EP to see with plan for possible PPM  Acute CHF - echo in 2020 showed LVEF 60-65%, normal diastolic parameters, mild to mod MR - CXR this admission suggestive of mild CHF - s/p IV lasix in the ER with improvement of breathing - re-check echo as above  Hypermagnesemia - 2.5 on admission - PTA mag held - recheck mag 2.2  CKD stage 3 - Baseline Scr 1.5-1.6 - s/p IV lasix 40mg  - daily BMET - patient follows with nephrology as outpatient  HLD - LDL 54 - continue simvastatin 20mg  daily  HTN - PTA amlodipine 5mg  resumed - Bps wnl  Hypothyroidism - PTA levothyroxine - per IM  H/o TIA - continue PTA plavix  For questions or updates, please contact [facility] Please consult [email or web address] for contact info under    Signed,  David Stall, PA-C  03/17/2023 7:36 AM

## 2023-03-17 NOTE — H&P (Addendum)
ELECTROPHYSIOLOGY H&P NOTE    Patient ID: Carly Marshall MRN: [id number], DOB/AGE: Jun 14, 1936 87 y.o.  Admit date: 03/17/2023 Date of Consult: 03/17/2023  Primary Physician: Duanne Limerick, MD Primary Cardiologist: New Electrophysiologist:  (New)  Patient Profile: Carly Marshall is a 87 y.o. female with a history of  HTN, HLD, hypothyroidism, h/o TIA, CKD stage 3 with one kidney, anemia who is now admitted at [facility] for bradycardia   HPI:  Carly Marshall is a 87 y.o. female with no cardiac history. Who was referred to the ER due to a nurse visit from her insurance company during which she was noted to have a heart rate in the 30's. The patient does endorse recent weakness, easy fatigability, and shortness of breath. No lightheadedness, near-syncope, or syncope.   In the ER, she was noted to have Mobitz I AV block with frequent episodes of 2:1 block resulting in V-rates in the 30's. She is not on any rate-slowing medications.    Past Medical History:  Diagnosis Date   Allergy    B12 deficiency anemia    Hyperlipidemia    Hypertension    Stroke [facility])    Thyroid disease       Home medications Medications Prior to Admission  Medication Sig Dispense Refill Last Dose   albuterol (VENTOLIN HFA) 108 (90 Base) MCG/ACT inhaler INHALE 2 PUFFS 4 TIMES A DAY   03/16/2023   amLODipine (NORVASC) 5 MG tablet Take 1 tablet (5 mg total) by mouth 2 (two) times daily. 180 tablet 1 03/17/2023   Calcium Carb-Cholecalciferol (CALCIUM 600 + D PO) Take 1 capsule by mouth 2 (two) times daily.   03/17/2023   Cholecalciferol (VITAMIN D3) 50 MCG (2000 UT) CAPS Take 1 capsule by mouth daily.   03/17/2023   clopidogrel (PLAVIX) 75 MG tablet Take 1 tablet (75 mg total) by mouth daily. 90 tablet 1 03/17/2023   cyanocobalamin (VITAMIN B12) 1000 MCG/ML injection INJECT ONCE A MONTH 3 mL 0 Past Month   fluticasone (FLONASE) 50 MCG/ACT nasal spray Place 2 sprays into both nostrils daily. 16 g 5 03/16/2023    hydrochlorothiazide (HYDRODIURIL) 12.5 MG tablet Take 1 tablet (12.5 mg total) by mouth daily. 90 tablet 1 03/17/2023   levothyroxine (SYNTHROID) 50 MCG tablet Take 1 tablet (50 mcg total) by mouth daily. 90 tablet 1 03/17/2023   losartan (COZAAR) 50 MG tablet Take 1 tablet (50 mg total) by mouth daily. 90 tablet 1 03/17/2023   Multiple Vitamins-Minerals (OCUVITE ADULT 50+ PO) Take 1 tablet by mouth daily.   03/17/2023   simvastatin (ZOCOR) 20 MG tablet Take 1 tablet (20 mg total) by mouth daily. 90 tablet 1 03/17/2023   vitamin C (ASCORBIC ACID) 500 MG tablet Take 500 mg by mouth daily.   03/17/2023      Physical Exam: Vitals:   03/17/23 1615 03/17/23 1620 03/17/23 2009  BP: (!) 167/52 (!) 167/52 (!) 121/93  Pulse: (!) 49 (!) 49   Resp: 18 18 18   Temp:   98.2 F (36.8 C)  TempSrc:   Oral  SpO2: 96% 96% 94%  Weight: 62.1 kg    Height: 5\' 2"  (1.575 m)      Gen: Appears comfortable, well-nourished CV: brady regular rhythm, no dependent edema Pulm: breathing easily  PERTINENT STUDIES SUMMARIZED:  Echocardiogram:      ordered  Heart Cath:     Imaging:   CXR: cardiomegaly and prominent pulmonary vasculature   EKG:   Sinus rhythm  with second degree 2:1 AV block (personally reviewed)  TELEMETRY:    Second degree type I AV block, often 2:1 (personally reviewed)  DEVICE HISTORY:    NA   ASSESSMENT & PLAN:  Symptomatic bradycardia with Mobitz I block, often 2:1 No reversible cause We discussed management options and made the joint decision to proceed with pacemaker placement We will plan for placement of a dual chamber pacemaker NPO after midnight TTE is pending  I discussed the indication for the procedure and the logistics, risks, potential benefit, and after care. I specifically explained that risks include but are not limited to infection, bleeding,damage to blood vessels, lung, and the heart -- but risk of prolonged hospitalization, need for surgery, or the event of stroke,  heart attack, or death are low but not zero.    History of hypothyroid TSH, T4 ok -- not contributing to bradycardia Continue synthroid  History of cerebrovascular disease Clopidogrel held for procedure Continue statin  Hypertension Amlodipine held, can restart tomorrow    For questions or updates, please contact [facility] Please consult [email or web address] for contact info under Cardiology/STEMI.  Signed, York Pellant, MD 03/17/2023 8:52 PM

## 2023-03-17 NOTE — Consult Note (Signed)
ELECTROPHYSIOLOGY CONSULT NOTE    Patient ID: Carly Marshall MRN: [id number], DOB/AGE: 03-30-1936 87 y.o.  Admit date: 03/16/2023 Date of Consult: 03/17/2023  Primary Physician: Duanne Limerick, MD Primary Cardiologist: None  Electrophysiologist: new to Dr. Nelly Laurence   Referring Provider: Dr. Joylene Igo  Patient Profile: Carly Marshall is a 87 y.o. female with a history of HTN, hypothyroid, HLD, CKD 3b who is being seen today for the evaluation of bradycardia and SOB at the request of Dr. Joylene Igo.  HPI:  Carly Marshall is a 87 y.o. female with PMH as above. She had a routine checkup yesterday with a nurse from her insurance company yesterday and was noted to be bradycardic to the 30s. She had complained of SOB with increased fatigue for the past month, and so presented to [facility] ER for further evaluation.  While in ER, initial EKG with marked sinus bradycardia, and concern for CHB. She has remained hemodynamically stable. Labs notable for slight AKI on CKD with Cr up to 2, baseline 1.6-1.8. TSH WNL.   She was diagnosed with PNA earlier this year and follow-up chest x-ray revealed cardiomegaly. She was referred to cardiology for further eval, but has not establish care yet, appt at the end of August.  Updated TTE pending.    She denies palpitations, chest pain, chest pressure. No syncope or presyncope. Has good appetite.  Daughter at beside with PMH as above who has had weakness and SOB x 1 month   Labs Potassium3.8 (08/06 0539) Magnesium  2.2 (08/06 0539) Creatinine, ser  2.03* (08/06 0539) PLT  244 (08/06 0539) HGB  11.4* (08/06 0539) WBC 5.8 (08/06 0539) Troponin I (High Sensitivity)20* (08/05 1632).    Past Medical History:  Diagnosis Date   Allergy    B12 deficiency anemia    Hyperlipidemia    Hypertension    Stroke [facility])    Thyroid disease      Surgical History:  Past Surgical History:  Procedure Laterality Date   COLONOSCOPY  2012   normal- Dr Henrene Hawking   ECTOPIC  PREGNANCY SURGERY       (Not in a hospital admission)   Inpatient Medications:   amLODipine  5 mg Oral Daily   cholecalciferol  2,000 Units Oral Daily   clopidogrel  75 mg Oral Daily   levothyroxine  50 mcg Oral QAC breakfast   simvastatin  20 mg Oral q1800    Allergies: No Known Allergies  Family History  Problem Relation Age of Onset   Diabetes Mother    Heart disease Mother    Fibromyalgia Mother    Heart disease Father    Hyperlipidemia Father    Hypertension Father    Diabetes Sister    Brain cancer Brother    Rheum arthritis Sister      Physical Exam: Vitals:   03/17/23 0400 03/17/23 0407 03/17/23 0430 03/17/23 0743  BP: (!) 140/56   (!) 158/121  Pulse: 60  (!) 59 77  Resp: 16  16 13   Temp:  98.2 F (36.8 C)    TempSrc:  Oral    SpO2: 100%  100% 94%  Weight:      Height:        GEN- NAD, A&O x 3, normal affect HEENT: Normocephalic, atraumatic Lungs- CTAB, Normal effort.  Heart- Regular rate and rhythm, No M/G/R.  GI- Soft, NT, ND.  Extremities- No clubbing, cyanosis, or edema   Radiology/Studies: DG Chest 2 View  Result Date: 03/16/2023 CLINICAL DATA:  Chest pain EXAM: CHEST - 2 VIEW COMPARISON:  01/19/2023 FINDINGS: Transverse diameter of heart is increased. Central pulmonary vessels are more prominent. There are no signs of alveolar pulmonary edema. No new focal pulmonary consolidation is seen. Small linear densities are seen in left lower lung field. There is no pleural effusion or pneumothorax. There is decrease in height of few lower thoracic and upper lumbar vertebral bodies, possibly old compression fractures. IMPRESSION: Cardiomegaly. Central pulmonary vessels are more prominent suggesting possible mild CHF. There are no signs of alveolar pulmonary edema or focal pulmonary consolidation. Small linear densities in left lower lung field may suggest scarring or subsegmental atelectasis. Electronically Signed   By: Ernie Avena M.D.   On: 03/16/2023  15:39    EKG:  8/5 - SB with non-conducted PAC, rate 37 02/08/2019 - NSR with 1st deg HB, rate 61; PR interval (personally reviewed)  TELEMETRY: SB with wenkebach, intermittent CHB (personally reviewed)   Assessment/Plan: #) SND #) fatigue #) SOB #) hypothyroid Presented to ER with marked bradycardia, c/o fatigue and SOB x 1 month TSH WNL No BB prior to admission Per 01/19/23 PCP note, patient is taking 5mg  amlodipine BID. Will hold all CCB - adjust to hydral PRN for blood pressure control Pending updated TTE Will hold plavix at this time, resume after procedure   Briefly discussed PPM indication with patient and daughter, and need to transfer to [facility] for implant. They are agreeable with transfer and proceeding      For questions or updates, please contact [facility] Please consult [email or web address] for contact info under Cardiology/STEMI.  Signed, Sherie Don, NP  03/17/2023 8:38 AM

## 2023-03-17 NOTE — Discharge Summary (Signed)
Physician Discharge Summary   Patient: Carly Marshall MRN: [id number] DOB: 12-05-1935  Admit date:     03/16/2023  Discharge date: 03/17/23  Discharge Physician:     PCP: Duanne Limerick, MD   Recommendations at discharge:   Transfer to [facility] for pacemaker insertion  Discharge Diagnoses: Principal Problem:   Symptomatic bradycardia Active Problems:   Essential hypertension   Hypothyroidism   Hyperlipidemia   Edema of lower extremity   Anemia in chronic kidney disease   CKD stage 3b, GFR 30-44 ml/min (HCC)   Hypermagnesemia   AKI (acute kidney injury) (HCC)  Resolved Problems:   Hypomagnesemia  Hospital Course: Ms. Carly Marshall is a 87 year old female with history of hypertension, hypothyroid, hyperlipidemia, CKD stage IIIb, who presents to the emergency department for chief concerns of shortness of breath and low heart rate.  Vitals in the ED showed temperature of 98.1, respiration rate of 15, heart rate of 42, with multiple times in the 30s on telemetry in the ED, blood pressure 129/64, SpO2 of 100% on room air.  Serum sodium is 139, potassium 4.6, chloride 106, bicarb 23, BUN of 33, serum creatinine of 1.90, EGFR 26, nonfasting blood glucose 153, WBC 6.3, hemoglobin 10.7, platelets of 233.  High sensitive troponin was 20.  ED treatment: Furosemide 40 mg IV one-time dose.  EDP discussed patient with cardiologist on-call Dr. Okey Dupre who recommends hospital admission and cardiology service will consult with EP consultation for consideration of pacemaker placement.  Assessment and Plan:  Symptomatic bradycardia Patient with complaints of fatigue and generalized weakness. Denies any syncopal episodes or loss of consciousness TSH is within normal limits and patient is not on any chronotropic agents Seen by cardiology and they recommend pacemaker insertion Patient to be transferred to [facility] for pacemaker placement    AKI (acute kidney injury)  (HCC) Gentle IV fluid hydration Monitor renal function closely   .   CKD stage 3b, GFR 30-44 ml/min (HCC) Secondary to hypertension Baseline creatinine 1.58-1.60/eGFR 32    Anemia in chronic kidney disease Baseline hemoglobin is 11.7-12 Continue to monitor     Hypothyroidism Continue levothyroxine 50 mcg daily    Essential hypertension Continue amlodipine 5 mg daily        Consultants: Cardiology Procedures performed: 2D echocardiogram Disposition:  Transfer to [facility] Diet recommendation:  Discharge Diet Orders (From admission, onward)     Start     Ordered   03/17/23 0000  Diet - low sodium heart healthy        03/17/23 1532           Cardiac diet DISCHARGE MEDICATION: Allergies as of 03/17/2023   No Known Allergies      Medication List     TAKE these medications    albuterol 108 (90 Base) MCG/ACT inhaler Commonly known as: VENTOLIN HFA INHALE 2 PUFFS 4 TIMES A DAY   amLODipine 5 MG tablet Commonly known as: NORVASC Take 1 tablet (5 mg total) by mouth 2 (two) times daily.   ascorbic acid 500 MG tablet Commonly known as: VITAMIN C Take 500 mg by mouth daily.   CALCIUM 600 + D PO Take 1 capsule by mouth 2 (two) times daily.   clopidogrel 75 MG tablet Commonly known as: PLAVIX Take 1 tablet (75 mg total) by mouth daily.   cyanocobalamin 1000 MCG/ML injection Commonly known as: VITAMIN B12 INJECT ONCE A MONTH   fluticasone 50 MCG/ACT nasal spray Commonly known as: FLONASE Place  2 sprays into both nostrils daily.   hydrochlorothiazide 12.5 MG tablet Commonly known as: HYDRODIURIL Take 1 tablet (12.5 mg total) by mouth daily.   levothyroxine 50 MCG tablet Commonly known as: SYNTHROID Take 1 tablet (50 mcg total) by mouth daily.   losartan 50 MG tablet Commonly known as: COZAAR Take 1 tablet (50 mg total) by mouth daily.   OCUVITE ADULT 50+ PO Take 1 tablet by mouth daily.   simvastatin 20 MG tablet Commonly known as:  ZOCOR Take 1 tablet (20 mg total) by mouth daily.   vitamin D3 50 MCG (2000 UT) Caps Take 1 capsule by mouth daily.        Discharge Exam: Filed Weights   03/16/23 1440  Weight: 63 kg   Constitutional: appears frail, NAD, calm Eyes: PERRL, lids and conjunctivae normal ENMT: Mucous membranes are moist. Posterior pharynx clear of any exudate or lesions. Age-appropriate dentition. Hearing appropriate Neck: normal, supple, no masses, no thyromegaly Respiratory: clear to auscultation bilaterally, no wheezing, no crackles. Normal respiratory effort. No accessory muscle use.  Cardiovascular: Bradycardia, regular rhythm, no murmurs / rubs / gallops.  Bilateral 1 + lower extremity pitting edema. 2+ pedal pulses. No carotid bruits.  Abdomen: no tenderness, no masses palpated, no hepatosplenomegaly. Bowel sounds positive.  Musculoskeletal: no clubbing / cyanosis. No joint deformity upper and lower extremities. Good ROM, no contractures, no atrophy. Normal muscle tone.  Skin: no rashes, lesions, ulcers. No induration Neurologic: Sensation intact. Strength 5/5 in all 4.  Psychiatric: Normal judgment and insight. Alert and oriented x 3. Normal mood.   Condition at discharge: stable  The results of significant diagnostics from this hospitalization (including imaging, microbiology, ancillary and laboratory) are listed below for reference.   Imaging Studies: DG Chest 2 View  Result Date: 03/16/2023 CLINICAL DATA:  Chest pain EXAM: CHEST - 2 VIEW COMPARISON:  01/19/2023 FINDINGS: Transverse diameter of heart is increased. Central pulmonary vessels are more prominent. There are no signs of alveolar pulmonary edema. No new focal pulmonary consolidation is seen. Small linear densities are seen in left lower lung field. There is no pleural effusion or pneumothorax. There is decrease in height of few lower thoracic and upper lumbar vertebral bodies, possibly old compression fractures. IMPRESSION:  Cardiomegaly. Central pulmonary vessels are more prominent suggesting possible mild CHF. There are no signs of alveolar pulmonary edema or focal pulmonary consolidation. Small linear densities in left lower lung field may suggest scarring or subsegmental atelectasis. Electronically Signed   By: Ernie Avena M.D.   On: 03/16/2023 15:39    Microbiology: Results for orders placed or performed during the hospital encounter of 02/07/19  Novel Coronavirus,NAA,(SEND-OUT TO REF LAB - TAT 24-48 hrs); Hosp Order     Status: None   Collection Time: 02/07/19  9:50 PM   Specimen: Nasopharyngeal Swab; Respiratory  Result Value Ref Range Status   SARS-CoV-2, NAA NOT DETECTED NOT DETECTED Final    Comment: (NOTE) This test was developed and its performance characteristics determined by [employer]. This test has not been FDA cleared or approved. This test has been authorized by FDA under an Emergency Use Authorization (EUA). This test is only authorized for the duration of time the declaration that circumstances exist justifying the authorization of the emergency use of in vitro diagnostic tests for detection of SARS-CoV-2 virus and/or diagnosis of COVID-19 infection under section 564(b)(1) of the Act, 21 U.S.C. 272ZDG-6(Y)(4), unless the authorization is terminated or revoked sooner. When diagnostic testing is negative, the possibility  of a false negative result should be considered in the context of a patient's recent exposures and the presence of clinical signs and symptoms consistent with COVID-19. An individual without symptoms of COVID-19 and who is not shedding SARS-CoV-2 virus would expect to have a negative (not detected) result in this assay. Performed  At: [facility] [address] [place], [place] [id number] Jolene Schimke MD NW:[id number]    Coronavirus Source NASOPHARYNGEAL  Final    Comment: Performed at [facility], [address] [place],  [place] [address]    Labs: CBC: Recent Labs  Lab 03/16/23 1444 03/17/23 0539  WBC 6.3 5.8  HGB 10.7* 11.4*  HCT 34.6* 35.8*  MCV 95.3 93.7  PLT 233 244   Basic Metabolic Panel: Recent Labs  Lab 03/16/23 1444 03/17/23 0539  NA 139 140  K 4.6 3.8  CL 106 105  CO2 23 24  GLUCOSE 153* 107*  BUN 33* 32*  CREATININE 1.90* 2.03*  CALCIUM 9.3 9.2  MG 2.5* 2.2   Liver Function Tests: No results for input(s): "AST", "ALT", "ALKPHOS", "BILITOT", "PROT", "ALBUMIN" in the last 168 hours. CBG: No results for input(s): "GLUCAP" in the last 168 hours.  Discharge time spent: greater than 30 minutes.  Signed: Lucile Shutters, MD [facility] 03/17/2023

## 2023-03-17 NOTE — ED Notes (Signed)
Pt ambulated to bedside commode without difficulty. Denies any other needs at this time.

## 2023-03-18 ENCOUNTER — Encounter (HOSPITAL_COMMUNITY)
Admission: RE | Disposition: A | Discharge status: Home / Self Care | Payer: Self-pay | Source: Other Acute Inpatient Hospital | Attending: Cardiovascular Disease

## 2023-03-18 ENCOUNTER — Observation Stay (HOSPITAL_COMMUNITY): Payer: Medicare HMO

## 2023-03-18 ENCOUNTER — Other Ambulatory Visit: Payer: Self-pay

## 2023-03-18 DIAGNOSIS — R9431 Abnormal electrocardiogram [ECG] [EKG]: Secondary | ICD-10-CM

## 2023-03-18 DIAGNOSIS — I442 Atrioventricular block, complete: Secondary | ICD-10-CM

## 2023-03-18 DIAGNOSIS — I495 Sick sinus syndrome: Secondary | ICD-10-CM

## 2023-03-18 HISTORY — PX: PACEMAKER IMPLANT: EP1218

## 2023-03-18 SURGERY — PACEMAKER IMPLANT

## 2023-03-18 MED ORDER — MIDAZOLAM HCL 5 MG/5ML IJ SOLN
INTRAMUSCULAR | Status: AC
  Filled 2023-03-18: qty 5

## 2023-03-18 MED ORDER — CEFAZOLIN SODIUM-DEXTROSE 1-4 GM/50ML-% IV SOLN
1.0000 g | Freq: Once | INTRAVENOUS | Status: AC
  Administered 2023-03-18: 1 g via INTRAVENOUS
  Filled 2023-03-18: qty 50

## 2023-03-18 MED ORDER — LIDOCAINE HCL 1 % IJ SOLN
INTRAMUSCULAR | Status: AC
  Filled 2023-03-18: qty 60

## 2023-03-18 MED ORDER — FENTANYL CITRATE (PF) 100 MCG/2ML IJ SOLN
INTRAMUSCULAR | Status: AC
  Filled 2023-03-18: qty 2

## 2023-03-18 MED ORDER — ACETAMINOPHEN 325 MG PO TABS: 325.0000 mg | ORAL_TABLET | ORAL | Status: DC | PRN

## 2023-03-18 MED ORDER — SODIUM CHLORIDE 0.9 % IV SOLN
INTRAVENOUS | Status: AC
  Administered 2023-03-18: 80 mg
  Filled 2023-03-18: qty 2

## 2023-03-18 MED ORDER — ONDANSETRON HCL 4 MG/2ML IJ SOLN
4.0000 mg | Freq: Four times a day (QID) | INTRAMUSCULAR | Status: DC | PRN
  Administered 2023-03-19: 4 mg via INTRAVENOUS
  Filled 2023-03-18: qty 2

## 2023-03-18 MED ORDER — HEPARIN (PORCINE) IN NACL 1000-0.9 UT/500ML-% IV SOLN
INTRAVENOUS | Status: DC | PRN
  Administered 2023-03-18: 500 mL

## 2023-03-18 MED ORDER — LIDOCAINE HCL (PF) 1 % IJ SOLN
INTRAMUSCULAR | Status: DC | PRN
  Administered 2023-03-18: 55 mL

## 2023-03-18 MED ORDER — FENTANYL CITRATE (PF) 100 MCG/2ML IJ SOLN
INTRAMUSCULAR | Status: DC | PRN
  Administered 2023-03-18: 25 ug via INTRAVENOUS

## 2023-03-18 MED ORDER — FLUTICASONE PROPIONATE 50 MCG/ACT NA SUSP
2.0000 | Freq: Every day | NASAL | Status: DC
  Administered 2023-03-19: 2 via NASAL
  Filled 2023-03-18: qty 16

## 2023-03-18 MED ORDER — CEFAZOLIN SODIUM-DEXTROSE 2-4 GM/100ML-% IV SOLN
INTRAVENOUS | Status: AC
  Administered 2023-03-18: 2 g via INTRAVENOUS
  Filled 2023-03-18: qty 100

## 2023-03-18 MED ORDER — MIDAZOLAM HCL 5 MG/5ML IJ SOLN
INTRAMUSCULAR | Status: DC | PRN
  Administered 2023-03-18: 1 mg via INTRAVENOUS

## 2023-03-18 MED ORDER — ALBUTEROL SULFATE (2.5 MG/3ML) 0.083% IN NEBU
3.0000 mL | INHALATION_SOLUTION | Freq: Four times a day (QID) | RESPIRATORY_TRACT | Status: DC
  Filled 2023-03-18: qty 3

## 2023-03-18 MED ORDER — ALBUTEROL SULFATE (2.5 MG/3ML) 0.083% IN NEBU: 3.0000 mL | INHALATION_SOLUTION | RESPIRATORY_TRACT | Status: DC | PRN

## 2023-03-18 SURGICAL SUPPLY — 14 items
CABLE SURGICAL S-101-97-12 (CABLE) ×1 IMPLANT
CATH RIGHTSITE C315HIS02 (CATHETERS) IMPLANT
IPG PACE AZUR XT DR MRI W1DR01 (Pacemaker) IMPLANT
KIT MICROPUNCTURE NIT STIFF (SHEATH) IMPLANT
LEAD CAPSURE NOVUS 5076-52CM (Lead) IMPLANT
LEAD SELECT SECURE 3830 383069 (Lead) IMPLANT
PACE AZURE XT DR MRI W1DR01 (Pacemaker) ×1 IMPLANT
PAD DEFIB RADIO PHYSIO CONN (PAD) ×1 IMPLANT
SELECT SECURE 3830 383069 (Lead) ×1 IMPLANT
SHEATH 7FR PRELUDE SNAP 13 (SHEATH) IMPLANT
SHEATH PROBE COVER 6X72 (BAG) IMPLANT
SLITTER 6232ADJ (MISCELLANEOUS) IMPLANT
TRAY PACEMAKER INSERTION (PACKS) ×1 IMPLANT
WIRE HI TORQ VERSACORE-J 145CM (WIRE) IMPLANT

## 2023-03-18 NOTE — Plan of Care (Signed)

## 2023-03-18 NOTE — Progress Notes (Signed)
Rounding Note    Patient Name: Carly Marshall Date of Encounter: 03/18/2023  [facility] Cardiologist: None   Subjective   No new concerns, denies symptoms  Inpatient Medications    Scheduled Meds:  albuterol  3 mL Inhalation QID   chlorhexidine  60 mL Topical Once   cholecalciferol  1,000 Units Oral Daily   fluticasone  2 spray Each Nare Daily   gentamicin (GARAMYCIN) 80 mg in sodium chloride 0.9 % 500 mL irrigation  80 mg Irrigation To Cath   levothyroxine  50 mcg Oral Q0600   senna  1 tablet Oral QHS   simvastatin  20 mg Oral q1800   sodium chloride flush  3 mL Intravenous Q12H   Continuous Infusions:  sodium chloride     sodium chloride 50 mL/hr at 03/17/23 1857    ceFAZolin (ANCEF) IV     PRN Meds: acetaminophen, hydrALAZINE, mouth rinse, sodium chloride flush   Vital Signs    Vitals:   03/18/23 0014 03/18/23 0443 03/18/23 0557 03/18/23 0742  BP: (!) 145/65 (!) 158/44  (!) 147/98  Pulse:    60  Resp: 18 18  18   Temp: 98.5 F (36.9 C) 98 F (36.7 C)  98 F (36.7 C)  TempSrc: Oral Oral  Oral  SpO2: 94% 94%  94%  Weight:   61.7 kg   Height:        Intake/Output Summary (Last 24 hours) at 03/18/2023 0901 Last data filed at 03/18/2023 0400 Gross per 24 hour  Intake 432.57 ml  Output 250 ml  Net 182.57 ml      03/18/2023    5:57 AM 03/17/2023    4:15 PM 03/16/2023    2:40 PM  Last 3 Weights  Weight (lbs) 136 lb 136 lb 14.5 oz 139 lb  Weight (kg) 61.689 kg 62.1 kg 63.05 kg      Telemetry    SB with intermittent 2:1 AVblock, 30's-50's - Personally Reviewed  ECG    No new KEGs - Personally Reviewed  Physical Exam    GEN: No acute distress.   Neck: No JVD Cardiac: RRR, bradycardic, no murmurs, rubs, or gallops.  Respiratory: Clear to auscultation bilaterally. GI: Soft, nontender, non-distended  MS: No edema; No deformity. Neuro:  Nonfocal  Psych: Normal affect   Labs    High Sensitivity Troponin:   Recent Labs  Lab  03/16/23 1444 03/16/23 1632  TROPONINIHS 20* 20*     Chemistry Recent Labs  Lab 03/16/23 1444 03/17/23 0539 03/18/23 0010  NA 139 140 136  K 4.6 3.8 3.8  CL 106 105 102  CO2 23 24 24   GLUCOSE 153* 107* 112*  BUN 33* 32* 31*  CREATININE 1.90* 2.03* 2.05*  CALCIUM 9.3 9.2 8.9  MG 2.5* 2.2  --   GFRNONAA 26* 24* 23*  ANIONGAP 10 11 10     Lipids No results for input(s): "CHOL", "TRIG", "HDL", "LABVLDL", "LDLCALC", "CHOLHDL" in the last 168 hours.  Hematology Recent Labs  Lab 03/16/23 1444 03/17/23 0539  WBC 6.3 5.8  RBC 3.63* 3.82*  HGB 10.7* 11.4*  HCT 34.6* 35.8*  MCV 95.3 93.7  MCH 29.5 29.8  MCHC 30.9 31.8  RDW 14.1 13.9  PLT 233 244   Thyroid  Recent Labs  Lab 03/16/23 1444  TSH 2.448  FREET4 1.14*    BNPNo results for input(s): "BNP", "PROBNP" in the last 168 hours.  DDimer No results for input(s): "DDIMER" in the last 168 hours.  Radiology    DG Chest 2 View  Result Date: 03/16/2023 CLINICAL DATA:  Chest pain EXAM: CHEST - 2 VIEW COMPARISON:  01/19/2023 FINDINGS: Transverse diameter of heart is increased. Central pulmonary vessels are more prominent. There are no signs of alveolar pulmonary edema. No new focal pulmonary consolidation is seen. Small linear densities are seen in left lower lung field. There is no pleural effusion or pneumothorax. There is decrease in height of few lower thoracic and upper lumbar vertebral bodies, possibly old compression fractures. IMPRESSION: Cardiomegaly. Central pulmonary vessels are more prominent suggesting possible mild CHF. There are no signs of alveolar pulmonary edema or focal pulmonary consolidation. Small linear densities in left lower lung field may suggest scarring or subsegmental atelectasis. Electronically Signed   By: Ernie Avena M.D.   On: 03/16/2023 15:39    Cardiac Studies   Updated echo is ordered, pending  02/08/2019: TTE 1. The left ventricle has normal systolic function with an ejection   fraction of 60-65%. The cavity size was normal. There is moderately  increased left ventricular wall thickness. Left ventricular diastolic  parameters were normal.   2. The right ventricle has normal systolic function. The cavity was  normal. There is no increase in right ventricular wall thickness.   3. Mitral valve regurgitation is mild to moderate by color flow Doppler.   4. The aortic valve is tricuspid. Aortic valve regurgitation was not  assessed by color flow Doppler.   Patient Profile     87 y.o. female w/PMHx of HTN, HLD, TIA, hypothyroidism, solotary kidney, chronic anemia.   Admitted to [facility] where she was  referred by an insurance visiting nurse who found her bradycardiac in the 30's The pt reported of late unusually fatigued, tired No reports of syncope Found bradycardic in the 30's with intermittent 2:1 AVblock  Assessment & Plan    Symptomatic bradycardia Baseline conduction system disease with marked 1st degree AVblock Advanced heart block including Mobitz II w/ 2:1 conduction No reversible causes/on no nodal blocking agents home or in pt   Planned for PPM implant, pt remains agreeable Narrow QRS Echo is pending to be done this AM, do not anticipate ICD with advanced age She is left handed, discussed temporary LUE restrictions  2. HTN Prn med for now Stable Resume home regime post PPM  3. Hx of TIA Home plavix held Resume post PPM pending pocket stability  4. CKD (III) Solitary kidney Creat up from her baseline (baseline about 1.6) this admission 1.9 > 2.05 Getting gentle hydration  Anticipate more improvement with better HRs   For questions or updates, please contact [facility] Please consult [email or web address] for contact info under        Signed, Sheilah Pigeon, PA-C  03/18/2023, 9:01 AM

## 2023-03-18 NOTE — Discharge Instructions (Signed)
After Your Pacemaker   You have a [employer] Pacemaker  ACTIVITY Do not lift your arm above shoulder height for 1 week after your procedure. After 7 days, you may progress as below.  You should remove your sling 24 hours after your procedure, unless otherwise instructed by your provider.     Wednesday March 25, 2023  Thursday March 26, 2023 Friday March 27, 2023 Saturday March 28, 2023   Do not lift, push, pull, or carry anything over 10 pounds with the affected arm until 6 weeks (Wednesday April 29, 2023 ) after your procedure.   You may drive AFTER your wound check, unless you have been told otherwise by your provider.   Ask your healthcare provider when you can go back to work   INCISION/Dressing If you are on a blood thinner such as Coumadin, Xarelto, Eliquis, Plavix, or Pradaxa please confirm with your provider when this should be resumed.  Resume Plavix (clopidogrel) 03/22/23  Monitor your Pacemaker site for redness, swelling, and drainage. Call the device clinic at [phone] if you experience these symptoms or fever/chills.  If your incision is sealed with Steri-strips or staples, you may shower 7 days after your procedure or when told by your provider. Do not remove the steri-strips or let the shower hit directly on your site. You may wash around your site with soap and water.     Avoid lotions, ointments, or perfumes over your incision until it is well-healed.  You may use a hot tub or a pool AFTER your wound check appointment if the incision is completely closed.  Pacemaker Alerts:  Some alerts are vibratory and others beep. These are NOT emergencies. Please call our office to let us know. If this occurs at night or on weekends, it can wait until the next business day. Send a remote transmission.  If your device is capable of reading fluid status (for heart failure), you will be offered monthly monitoring to review this with you.   DEVICE MANAGEMENT Remote  monitoring is used to monitor your pacemaker from home. This monitoring is scheduled every 91 days by our office. It allows [place] to keep an eye on the functioning of your device to ensure it is working properly. You will routinely see your Electrophysiologist annually (more often if necessary).   You should receive your ID card for your new device in 4-8 weeks. Keep this card with you at all times once received. Consider wearing a medical alert bracelet or necklace.  Your Pacemaker may be MRI compatible. This will be discussed at your next office visit/wound check.  You should avoid contact with strong electric or magnetic fields.   Do not use amateur (ham) radio equipment or electric (arc) welding torches. MP3 player headphones with magnets should not be used. Some devices are safe to use if held at least 12 inches (30 cm) from your Pacemaker. These include power tools, lawn mowers, and speakers. If you are unsure if something is safe to use, ask your health care provider.  When using your cell phone, hold it to the ear that is on the opposite side from the Pacemaker. Do not leave your cell phone in a pocket over the Pacemaker.  You may safely use electric blankets, heating pads, computers, and microwave ovens.  Call the office right away if: You have chest pain. You feel more short of breath than you have felt before. You feel more light-headed than you have felt before. Your incision starts to open up.  This information is not intended to replace advice given to you by your health care provider. Make sure you discuss any questions you have with your health care provider.

## 2023-03-18 NOTE — Progress Notes (Signed)
Echocardiogram 2D Echocardiogram has been performed.  Warren Lacy  RDCS 03/18/2023, 10:28 AM

## 2023-03-19 ENCOUNTER — Ambulatory Visit: Payer: Medicare HMO

## 2023-03-19 ENCOUNTER — Encounter (HOSPITAL_COMMUNITY): Payer: Self-pay | Admitting: Cardiovascular Disease

## 2023-03-19 ENCOUNTER — Other Ambulatory Visit: Payer: Self-pay | Admitting: Physician Assistant

## 2023-03-19 ENCOUNTER — Observation Stay (HOSPITAL_COMMUNITY): Payer: Medicare HMO

## 2023-03-19 DIAGNOSIS — I495 Sick sinus syndrome: Secondary | ICD-10-CM | POA: Diagnosis not present

## 2023-03-19 DIAGNOSIS — N183 Chronic kidney disease, stage 3 unspecified: Secondary | ICD-10-CM | POA: Diagnosis not present

## 2023-03-19 DIAGNOSIS — E039 Hypothyroidism, unspecified: Secondary | ICD-10-CM | POA: Diagnosis not present

## 2023-03-19 DIAGNOSIS — I441 Atrioventricular block, second degree: Secondary | ICD-10-CM | POA: Diagnosis not present

## 2023-03-19 DIAGNOSIS — Z79890 Hormone replacement therapy (postmenopausal): Secondary | ICD-10-CM | POA: Diagnosis not present

## 2023-03-19 DIAGNOSIS — Z8673 Personal history of transient ischemic attack (TIA), and cerebral infarction without residual deficits: Secondary | ICD-10-CM | POA: Diagnosis not present

## 2023-03-19 DIAGNOSIS — Z7902 Long term (current) use of antithrombotics/antiplatelets: Secondary | ICD-10-CM | POA: Diagnosis not present

## 2023-03-19 DIAGNOSIS — E785 Hyperlipidemia, unspecified: Secondary | ICD-10-CM | POA: Diagnosis not present

## 2023-03-19 DIAGNOSIS — I130 Hypertensive heart and chronic kidney disease with heart failure and stage 1 through stage 4 chronic kidney disease, or unspecified chronic kidney disease: Secondary | ICD-10-CM

## 2023-03-19 DIAGNOSIS — Z950 Presence of cardiac pacemaker: Secondary | ICD-10-CM | POA: Diagnosis not present

## 2023-03-19 DIAGNOSIS — Z79899 Other long term (current) drug therapy: Secondary | ICD-10-CM | POA: Diagnosis not present

## 2023-03-19 DIAGNOSIS — I129 Hypertensive chronic kidney disease with stage 1 through stage 4 chronic kidney disease, or unspecified chronic kidney disease: Secondary | ICD-10-CM | POA: Diagnosis not present

## 2023-03-19 DIAGNOSIS — N179 Acute kidney failure, unspecified: Secondary | ICD-10-CM | POA: Diagnosis not present

## 2023-03-19 DIAGNOSIS — Z905 Acquired absence of kidney: Secondary | ICD-10-CM | POA: Diagnosis not present

## 2023-03-19 LAB — BASIC METABOLIC PANEL
Anion gap: 14 (ref 5–15)
BUN: 26 mg/dL — ABNORMAL HIGH (ref 8–23)
CO2: 22 mmol/L (ref 22–32)
Calcium: 9.5 mg/dL (ref 8.9–10.3)
Chloride: 104 mmol/L (ref 98–111)
Creatinine, Ser: 1.5 mg/dL — ABNORMAL HIGH (ref 0.44–1.00)
GFR, Estimated: 34 mL/min — ABNORMAL LOW (ref >60)
Glucose, Bld: 106 mg/dL — ABNORMAL HIGH (ref 70–99)
Potassium: 3.8 mmol/L (ref 3.5–5.1)
Sodium: 140 mmol/L (ref 135–145)

## 2023-03-19 NOTE — Discharge Summary (Addendum)
DISCHARGE SUMMARY    Patient ID: Carly Marshall,  MRN: [id number], DOB/AGE: July 21, 1936 87 y.o.  Admit date: 03/17/2023 Discharge date: 03/19/2023  Primary Care Physician: Duanne Limerick, MD  Primary Cardiologist: none previously Electrophysiologist: new > Dr. Nelly Laurence  Primary Discharge Diagnosis:  Symptomatic bradycardia  Advanced heart block, Mobitz II status post pacemaker implantation this admission  Secondary Discharge Diagnosis:  HTN HLD Hypothyroidism TIA Historically 5.   Single kidney 6    CKD III  No Known Allergies   Procedures This Admission:  1.  Implantation of a MDT dual chamber PPM on 03/18/23 by Dr Nelly Laurence.   There were no immediate post procedure complications. CXR on 03/19/23 demonstrated no pneumothorax status post device implantation.   Brief HPI: Carly Marshall is a 87 y.o. female was initially treated at [facility] where she was referred to by a visiting insurance nurse who noted her to be bradycardic.  There she was found to have SB, as well as advanced AV block including Mobiz I w periods of persistent 2:1 block and rates into the 30's with no reversible causes transferred to [facility] for further management   Hospital Course:  The patient was admitted and underwent implantation of a PPM with details as outlined in the procedure report.  Labs noted AKI that with IVF and improved HRs has returned to her baseline.  She was monitored on telemetry overnight which demonstrated SR/V pacing/AV pacing.  Left chest was without hematoma or ecchymosis.  The device was interrogated and found to be functioning normally.  CXR was obtained and demonstrated no pneumothorax status post device implantation.  Wound care, arm mobility, and restrictions were reviewed with the patient.  The patient feels well, denies any P/SOB, with minimal site discomfort.  She was examined by Dr. Nelly Laurence and considered stable for discharge to home.   Pt confirms her amlodipine dose as accurate,  deferred to her PMD/nephrologist out pt Resume Plavix 03/22/23   Physical Exam: Vitals:   03/19/23 0106 03/19/23 0501 03/19/23 0746 03/19/23 0755  BP: (!) 161/64 (!) 154/77 (!) 165/67 (!) 165/67  Pulse: 61 62 63   Resp: 17 17 19    Temp: 97.8 F (36.6 C) 98 F (36.7 C) 97.8 F (36.6 C)   TempSrc: Oral Oral Oral   SpO2: 96% 98% 97%   Weight:  61.8 kg    Height:        GEN- The patient is well appearing, alert and oriented x 3 today.   HEENT: normocephalic, atraumatic; sclera clear, conjunctiva pink; hearing intact; oropharynx clear; neck supple, no JVP Lungs-  CTA b/l, normal work of breathing.  No wheezes, rales, rhonchi Heart- RRR, no murmurs, rubs or gallops, PMI not laterally displaced GI- soft, non-tender, non-distended Extremities- no clubbing, cyanosis, or edema MS- no significant deformity or atrophy Skin- warm and dry, no rash or lesion, left chest without hematoma/ecchymosis Psych- euthymic mood, full affect Neuro- no gross deficits   Labs:   Lab Results  Component Value Date   WBC 5.8 03/17/2023   HGB 11.4 (L) 03/17/2023   HCT 35.8 (L) 03/17/2023   MCV 93.7 03/17/2023   PLT 244 03/17/2023    Recent Labs  Lab 03/19/23 0759  NA 140  K 3.8  CL 104  CO2 22  BUN 26*  CREATININE 1.50*  CALCIUM 9.5  GLUCOSE 106*    Discharge Medications:  Allergies as of 03/19/2023   No Known Allergies  Medication List     TAKE these medications    albuterol 108 (90 Base) MCG/ACT inhaler Commonly known as: VENTOLIN HFA INHALE 2 PUFFS 4 TIMES A DAY   amLODipine 5 MG tablet Commonly known as: NORVASC Take 1 tablet (5 mg total) by mouth 2 (two) times daily.   ascorbic acid 500 MG tablet Commonly known as: VITAMIN C Take 500 mg by mouth daily.   CALCIUM 600 + D PO Take 1 capsule by mouth 2 (two) times daily.   clopidogrel 75 MG tablet Commonly known as: PLAVIX Take 1 tablet (75 mg total) by mouth daily. Notes to patient: Resume 03/22/23    cyanocobalamin 1000 MCG/ML injection Commonly known as: VITAMIN B12 INJECT ONCE A MONTH   fluticasone 50 MCG/ACT nasal spray Commonly known as: FLONASE Place 2 sprays into both nostrils daily.   hydrochlorothiazide 12.5 MG tablet Commonly known as: HYDRODIURIL Take 1 tablet (12.5 mg total) by mouth daily.   levothyroxine 50 MCG tablet Commonly known as: SYNTHROID Take 1 tablet (50 mcg total) by mouth daily.   losartan 50 MG tablet Commonly known as: COZAAR Take 1 tablet (50 mg total) by mouth daily.   OCUVITE ADULT 50+ PO Take 1 tablet by mouth daily.   simvastatin 20 MG tablet Commonly known as: ZOCOR Take 1 tablet (20 mg total) by mouth daily.   vitamin D3 50 MCG (2000 UT) Caps Take 1 capsule by mouth daily.        Disposition: Home Discharge Instructions     Diet - low sodium heart healthy   Complete by: As directed    Increase activity slowly   Complete by: As directed         Duration of Discharge Encounter: Greater than 30 minutes including physician time.  Norma Fredrickson, PA-C 03/19/2023 11:54 AM

## 2023-03-19 NOTE — Plan of Care (Signed)
  Problem: Education: Goal: Knowledge of General Education information will improve Description: Including pain rating scale, medication(s)/side effects and non-pharmacologic comfort measures Outcome: Progressing   Problem: Clinical Measurements: Goal: Diagnostic test results will improve Outcome: Progressing   Problem: Coping: Goal: Level of anxiety will decrease Outcome: Progressing   Problem: Safety: Goal: Ability to remain free from injury will improve Outcome: Progressing   Problem: Education: Goal: Understanding of cardiac disease, CV risk reduction, and recovery process will improve Outcome: Progressing

## 2023-03-19 NOTE — TOC Transition Note (Signed)
Transition of Care [facility]) - CM/SW Discharge Note   Patient Details  Name: Carly Marshall MRN: [id number] Date of Birth: [birth year]  Transition of Care [facility]) CM/SW Contact:  Leone Haven, RN Phone Number: 03/19/2023, 11:57 AM   Clinical Narrative:    Patient is for dc today, she has transportation, she is indep with walker.       Barriers to Discharge: Continued Medical Work up   Patient Goals and CMS Choice   Choice offered to / list presented to : NA  Discharge Placement                         Discharge Plan and Services Additional resources added to the After Visit Summary for   In-house Referral: NA Discharge Planning Services: CM Consult Post Acute Care Choice: NA          DME Arranged: N/A DME Agency: NA       HH Arranged: NA          Social Determinants of Health (SDOH) Interventions SDOH Screenings   Food Insecurity: No Food Insecurity (03/17/2023)  Housing: Low Risk  (03/17/2023)  Transportation Needs: No Transportation Needs (03/17/2023)  Utilities: Not At Risk (03/17/2023)  Alcohol Screen: Low Risk  (03/26/2022)  Depression (PHQ2-9): Low Risk  (01/19/2023)  Financial Resource Strain: Low Risk  (03/26/2022)  Physical Activity: Sufficiently Active (03/26/2022)  Social Connections: Moderately Integrated (03/26/2022)  Stress: No Stress Concern Present (03/26/2022)  Tobacco Use: Low Risk  (03/16/2023)     Readmission Risk Interventions     No data to display

## 2023-03-20 ENCOUNTER — Telehealth: Payer: Self-pay | Admitting: *Deleted

## 2023-03-20 NOTE — Transitions of Care (Post Inpatient/ED Visit) (Signed)
03/20/2023  Name: Carly Marshall MRN: [id number] DOB: [birth year]  Today's TOC FU Call Status: Today's TOC FU Call Status:: Successful TOC FU Call Completed TOC FU Call Complete Date: 03/20/23  Transition Care Management Follow-up Telephone Call Date of Discharge: 03/20/23 Discharge Facility: [facility]) Type of Discharge: Inpatient Admission Primary Inpatient Discharge Diagnosis:: Sinus node dysfunction How have you been since you were released from the hospital?: Better Any questions or concerns?: No  Items Reviewed: Did you receive and understand the discharge instructions provided?: Yes Medications obtained,verified, and reconciled?: Yes (Medications Reviewed) Any new allergies since your discharge?: No Dietary orders reviewed?: No Do you have support at home?: Yes People in Home: alone Name of Support/Comfort Primary Source: Judeth Cornfield daughter ( also Charity fundraiser)  Medications Reviewed Today: Medications Reviewed Today     Reviewed by Luella Cook, RN (Case Manager) on 03/20/23 at 1222  Med List Status: <None>   Medication Order Taking? Sig Documenting Provider Last Dose Status Informant  albuterol (VENTOLIN HFA) 108 (90 Base) MCG/ACT inhaler [id number] Yes INHALE 2 PUFFS 4 TIMES A DAY [provider] Taking Active Self  amLODipine (NORVASC) 5 MG tablet [id number] Yes Take 1 tablet (5 mg total) by mouth 2 (two) times daily. Duanne Limerick, MD Taking Active Self  Calcium Carb-Cholecalciferol (CALCIUM 600 + D PO) [id number] Yes Take 1 capsule by mouth 2 (two) times daily. [provider] Taking Active Self  Cholecalciferol (VITAMIN D3) 50 MCG (2000 UT) CAPS [id number] Yes Take 1 capsule by mouth daily. [provider] Taking Active Self  clopidogrel (PLAVIX) 75 MG tablet [id number] Yes Take 1 tablet (75 mg total) by mouth daily. Duanne Limerick, MD Taking Active Self  cyanocobalamin (VITAMIN B12) 1000 MCG/ML injection [id number] Yes INJECT ONCE A  MONTH Duanne Limerick, MD Taking Active Self  fluticasone (FLONASE) 50 MCG/ACT nasal spray [id number] Yes Place 2 sprays into both nostrils daily. Duanne Limerick, MD Taking Active Self  hydrochlorothiazide (HYDRODIURIL) 12.5 MG tablet [id number] Yes Take 1 tablet (12.5 mg total) by mouth daily. Duanne Limerick, MD Taking Active Self  levothyroxine (SYNTHROID) 50 MCG tablet [id number] Yes Take 1 tablet (50 mcg total) by mouth daily. Duanne Limerick, MD Taking Active Self  losartan (COZAAR) 50 MG tablet [id number] Yes Take 1 tablet (50 mg total) by mouth daily. Duanne Limerick, MD Taking Active Self  Multiple Vitamins-Minerals (OCUVITE ADULT 50+ PO) [id number] Yes Take 1 tablet by mouth daily. [provider] Taking Active Self  simvastatin (ZOCOR) 20 MG tablet [id number] Yes Take 1 tablet (20 mg total) by mouth daily. Duanne Limerick, MD Taking Active Self  vitamin C (ASCORBIC ACID) 500 MG tablet [id number] Yes Take 500 mg by mouth daily. [provider] Taking Active Self            Home Care and Equipment/Supplies: Were Home Health Services Ordered?: NA Any new equipment or medical supplies ordered?: NA  Functional Questionnaire: Do you need assistance with bathing/showering or dressing?: No Do you need assistance with meal preparation?: Yes Do you need assistance with eating?: No Do you have difficulty maintaining continence: No Do you need assistance with getting out of bed/getting out of a chair/moving?: No Do you have difficulty managing or taking your medications?: No  Follow up appointments reviewed: PCP Follow-up appointment confirmed?: Yes Date of PCP follow-up appointment?: 03/24/23 Follow-up Provider: Elizabeth Sauer Specialist [facility] Follow-up appointment confirmed?: Yes Date of Specialist follow-up appointment?: 04/01/23 Follow-Up Specialty Provider:: Pacemaker  check Do you need transportation to your follow-up appointment?: No Do you understand care options  if your condition(s) worsen?: Yes-patient verbalized understanding  SDOH Interventions Today    Flowsheet Row Most Recent Value  SDOH Interventions   Food Insecurity Interventions Intervention Not Indicated  Housing Interventions Intervention Not Indicated  Transportation Interventions Intervention Not Indicated, Patient Resources (Friends/Family)      Interventions Today    Flowsheet Row Most Recent Value  General Interventions   General Interventions Discussed/Reviewed General Interventions Discussed, General Interventions Reviewed, Doctor Visits  Doctor Visits Discussed/Reviewed Doctor Visits Discussed, Doctor Visits Reviewed  Pharmacy Interventions   Pharmacy Dicussed/Reviewed Pharmacy Topics Discussed      TOC Interventions Today    Flowsheet Row Most Recent Value  TOC Interventions   TOC Interventions Discussed/Reviewed TOC Interventions Discussed, TOC Interventions Reviewed       Gean Maidens BSN RN [facility] [phone]

## 2023-03-23 ENCOUNTER — Other Ambulatory Visit: Payer: Self-pay | Admitting: Family Medicine

## 2023-03-23 DIAGNOSIS — I679 Cerebrovascular disease, unspecified: Secondary | ICD-10-CM

## 2023-03-23 DIAGNOSIS — E039 Hypothyroidism, unspecified: Secondary | ICD-10-CM

## 2023-03-24 ENCOUNTER — Encounter: Payer: Self-pay | Admitting: Family Medicine

## 2023-03-24 ENCOUNTER — Ambulatory Visit (INDEPENDENT_AMBULATORY_CARE_PROVIDER_SITE_OTHER): Payer: Medicare HMO | Admitting: Family Medicine

## 2023-03-24 VITALS — BP 138/78 | HR 60 | Ht 62.0 in | Wt 137.0 lb

## 2023-03-24 DIAGNOSIS — R001 Bradycardia, unspecified: Secondary | ICD-10-CM | POA: Diagnosis not present

## 2023-03-24 NOTE — Progress Notes (Signed)
Date:  03/24/2023   Name:  Carly Marshall   DOB:  Sep 28, 1935   MRN:  [id number]   Chief Complaint: Hospitalization Follow-up Micah Flesher to hospital with bradycardia on 8/5- was admitted and had pacemaker placed on 8/7. TOC placed on 8/9- pt doing "good" no shortness of breath. Has f/u on 04/10/23 with cardiology)  Follow up Hospitalization  Patient was admitted to [facility] on 8/5 and discharged on 03/19/23. She was treated for bradycardia/Mobitz block. Treatment for this included pacemaker implantation. Telephone follow up was done on 03/20/23 She reports excellent compliance with treatment. She reports this condition is resolved.  ----------------------------------------------------------------------------------------- -     [facility] AT [facility]                                   Transitional Care Clinic   [facility] Discharge Acute Issues Care Follow Up                                                                        Patient Demographics  Carly Marshall, is a 87 y.o. female  DOB 1936/07/31  MRN [id number].  Primary MD  Duanne Limerick, MD   Reason for TCC follow Up -a certain blood pressure has remained steady and that patient has not become symptomatic with placement.   Past Medical History:  Diagnosis Date   Allergy    B12 deficiency anemia    Hyperlipidemia    Hypertension    Stroke [facility])    Thyroid disease     Past Surgical History:  Procedure Laterality Date   COLONOSCOPY  2012   normal- Dr Henrene Hawking   ECTOPIC PREGNANCY SURGERY     PACEMAKER IMPLANT N/A 03/18/2023   Procedure: PACEMAKER IMPLANT;  Surgeon: Maurice Small, MD;  Location: [facility] INVASIVE CV LAB;  Service: Cardiovascular;  Laterality: N/A;  Recent HPI and Hospital course patient was found during insurance visit to have heart rate in the 40s and dropping down into the 30s.  Patient was noted to  have sinus bradycardia but also initiated with Mobitz block.  Patient underwent pacemaker placement is doing well Post Hospital acute care issue to be followed in clinic patient does have upcoming cardiology appointment otherwise there is no medical issues that were noted for [place] to recheck.      Subjective:   [name] today has, No headache, No chest pain, No abdominal pain - No Nausea, No new weakness tingling or numbness, No Cough - SOB.  No dyspnea on exertion no PND no orthopnea and no chest discomfort.  Assessment & Plan   1. Symptomatic bradycardia Patient has had resolution of dyspnea on exertion and general fatigue since pacemaker placement.  Will continue with current dosing of medication for blood pressure.  Reason for frequent admissions/ER visits no reasons for admissions at this time.      Objective:   Vitals:   03/24/23 1333  BP: 138/78  Pulse: 60  SpO2: 94%  Weight: 137 lb (62.1 kg)  Height: 5\' 2"  (1.575 m)    Wt Readings  from Last 3 Encounters:  03/24/23 137 lb (62.1 kg)  03/19/23 136 lb 3.9 oz (61.8 kg)  01/19/23 139 lb (63 kg)    Allergies as of 03/24/2023   No Known Allergies      Medication List        Accurate as of March 24, 2023  1:55 PM. If you have any questions, ask your nurse or doctor.          STOP taking these medications    albuterol 108 (90 Base) MCG/ACT inhaler Commonly known as: VENTOLIN HFA Stopped by: Elizabeth Sauer       TAKE these medications    amLODipine 5 MG tablet Commonly known as: NORVASC Take 1 tablet (5 mg total) by mouth 2 (two) times daily.   ascorbic acid 500 MG tablet Commonly known as: VITAMIN C Take 500 mg by mouth daily.   CALCIUM 600 + D PO Take 1 capsule by mouth 2 (two) times daily.   clopidogrel 75 MG tablet Commonly known as: PLAVIX Take 1 tablet (75 mg total) by mouth daily.   cyanocobalamin 1000 MCG/ML injection Commonly known as: VITAMIN B12 INJECT ONCE A MONTH    fluticasone 50 MCG/ACT nasal spray Commonly known as: FLONASE Place 2 sprays into both nostrils daily.   hydrochlorothiazide 12.5 MG tablet Commonly known as: HYDRODIURIL Take 1 tablet (12.5 mg total) by mouth daily. What changed:  when to take this reasons to take this   levothyroxine 50 MCG tablet Commonly known as: SYNTHROID Take 1 tablet (50 mcg total) by mouth daily.   losartan 50 MG tablet Commonly known as: COZAAR Take 1 tablet (50 mg total) by mouth daily.   OCUVITE ADULT 50+ PO Take 1 tablet by mouth daily.   simvastatin 20 MG tablet Commonly known as: ZOCOR Take 1 tablet (20 mg total) by mouth daily.   vitamin D3 50 MCG (2000 UT) Caps Take 1 capsule by mouth daily.         Physical Exam: Constitutional: Patient appears well-developed and well-nourished. Not in obvious distress. HENT: Normocephalic, atraumatic, External right and left ear normal. Oropharynx is clear and moist.  Eyes: Conjunctivae and EOM are normal. PERRLA, no scleral icterus. Neck: Normal ROM. Neck supple. No JVD. No tracheal deviation. No thyromegaly. CVS: RRR, S1/S2 +, no murmurs, no gallops, no carotid bruit.  Pulmonary: Effort and breath sounds normal, no stridor, rhonchi, wheezes, rales.  Abdominal: Soft. BS +, no distension, tenderness, rebound or guarding.  Musculoskeletal: Normal range of motion. No edema and no tenderness.  Lymphadenopathy: No lymphadenopathy noted, cervical, inguinal or axillary Neuro: Alert. Normal reflexes, muscle tone coordination. No cranial nerve deficit. Skin: Skin is warm and dry. No rash noted. Not diaphoretic. No erythema. No pallor. Psychiatric: Normal mood and affect. Behavior, judgment, thought content normal.   Data Review   Micro Results Recent Results (from the past 240 hour(s))  Surgical PCR screen     Status: None   Collection Time: 03/17/23  5:23 PM   Specimen: Nasal Mucosa; Nasal Swab  Result Value Ref Range Status   MRSA, PCR NEGATIVE  NEGATIVE Final   Staphylococcus aureus NEGATIVE NEGATIVE Final    Comment: (NOTE) The Xpert SA Assay (FDA approved for NASAL specimens in patients 16 years of age and older), is one component of a comprehensive surveillance program. It is not intended to diagnose infection nor to guide or monitor treatment. Performed at [facility], [address], [place], [place] [address]  CBC No results for input(s): "WBC", "HGB", "HCT", "PLT", "MCV", "MCH", "MCHC", "RDW", "LYMPHSABS", "MONOABS", "EOSABS", "BASOSABS", "BANDABS" in the last 168 hours.  Invalid input(s): "NEUTRABS", "BANDSABD"  Chemistries  Recent Labs  Lab 03/18/23 0010 03/19/23 0759  NA 136 140  K 3.8 3.8  CL 102 104  CO2 24 22  GLUCOSE 112* 106*  BUN 31* 26*  CREATININE 2.05* 1.50*  CALCIUM 8.9 9.5   ------------------------------------------------------------------------------------------------------------------ estimated creatinine clearance is 24.2 mL/min (A) (by C-G formula based on SCr of 1.5 mg/dL (H)). ------------------------------------------------------------------------------------------------------------------ No results for input(s): "HGBA1C" in the last 72 hours. ------------------------------------------------------------------------------------------------------------------ No results for input(s): "CHOL", "HDL", "LDLCALC", "TRIG", "CHOLHDL", "LDLDIRECT" in the last 72 hours. ------------------------------------------------------------------------------------------------------------------ No results for input(s): "TSH", "T4TOTAL", "T3FREE", "THYROIDAB" in the last 72 hours.  Invalid input(s): "FREET3" ------------------------------------------------------------------------------------------------------------------ No results for input(s): "VITAMINB12", "FOLATE", "FERRITIN", "TIBC", "IRON", "RETICCTPCT" in the last 72 hours.  Coagulation profile No results for input(s): "INR", "PROTIME" in  the last 168 hours.  No results for input(s): "DDIMER" in the last 72 hours.  Cardiac Enzymes No results for input(s): "CKMB", "TROPONINI", "MYOGLOBIN" in the last 168 hours.  Invalid input(s): "CK" ------------------------------------------------------------------------------------------------------------------ Invalid input(s): "POCBNP" Time spent in minutes 30     Elizabeth Sauer M.D on 03/24/2023 at 1:55 PM   Disclaimer: This note may have been dictated with voice recognition software. Similar sounding words can inadvertently be transcribed and this note may contain transcription errors which may not have been corrected upon publication of note.    Lab Results  Component Value Date   NA 140 03/19/2023   K 3.8 03/19/2023   CO2 22 03/19/2023   GLUCOSE 106 (H) 03/19/2023   BUN 26 (H) 03/19/2023   CREATININE 1.50 (H) 03/19/2023   CALCIUM 9.5 03/19/2023   EGFR 31 (L) 12/22/2022   GFRNONAA 34 (L) 03/19/2023   Lab Results  Component Value Date   CHOL 144 12/22/2022   HDL 77 12/22/2022   LDLCALC 54 12/22/2022   TRIG 65 12/22/2022   CHOLHDL 2.6 02/08/2019   Lab Results  Component Value Date   TSH 2.448 03/16/2023   Lab Results  Component Value Date   HGBA1C 5.7 (H) 12/22/2022   Lab Results  Component Value Date   WBC 5.8 03/17/2023   HGB 11.4 (L) 03/17/2023   HCT 35.8 (L) 03/17/2023   MCV 93.7 03/17/2023   PLT 244 03/17/2023   Lab Results  Component Value Date   ALT 9 04/21/2022   AST 21 04/21/2022   ALKPHOS 127 (H) 04/21/2022   BILITOT 0.6 04/21/2022   No results found for: "25OHVITD2", "25OHVITD3", "VD25OH"   Review of Systems  Constitutional:  Negative for chills, diaphoresis, fatigue and fever.  HENT:  Negative for trouble swallowing.   Eyes:  Negative for visual disturbance.  Respiratory:  Negative for cough, chest tightness, shortness of breath, wheezing and stridor.   Cardiovascular:  Negative for chest pain, palpitations and leg swelling.     Patient Active Problem List   Diagnosis Date Noted   Sinus node dysfunction (HCC) 03/17/2023   Symptomatic bradycardia 03/16/2023   CKD stage 3b, GFR 30-44 ml/min (HCC) 03/16/2023   Hypermagnesemia 03/16/2023   AKI (acute kidney injury) (HCC) 03/16/2023   Edema of lower extremity 08/30/2020   Anemia in chronic kidney disease 08/30/2020   Chronic venous insufficiency 05/11/2020   Aphasia 02/07/2019   Chronic kidney disease, stage 4 (severe) (HCC) 02/07/2019   B12 deficiency 04/28/2016   Essential hypertension 03/27/2015   Hypothyroidism 03/27/2015  Hyperlipidemia 03/27/2015   Allergic rhinitis due to pollen 03/27/2015    No Known Allergies  Past Surgical History:  Procedure Laterality Date   COLONOSCOPY  2012   normal- Dr Henrene Hawking   ECTOPIC PREGNANCY SURGERY     PACEMAKER IMPLANT N/A 03/18/2023   Procedure: PACEMAKER IMPLANT;  Surgeon: Maurice Small, MD;  Location: [facility] INVAS[facility] CV LAB;  Service: Cardiovascular;  Laterality: N/A;    Social History   Tobacco Use   Smoking status: Never   Smokeless tobacco: Never   Tobacco comments:    smoking cessation materials not required  Vaping Use   Vaping status: Never Used  Substance Use Topics   Alcohol use: No    Alcohol/week: 0.0 standard drinks of alcohol   Drug use: No     Medication list has been reviewed and updated.  Current Meds  Medication Sig   amLODipine (NORVASC) 5 MG tablet Take 1 tablet (5 mg total) by mouth 2 (two) times daily.   Calcium Carb-Cholecalciferol (CALCIUM 600 + D PO) Take 1 capsule by mouth 2 (two) times daily.   Cholecalciferol (VITAMIN D3) 50 MCG (2000 UT) CAPS Take 1 capsule by mouth daily.   clopidogrel (PLAVIX) 75 MG tablet Take 1 tablet (75 mg total) by mouth daily.   cyanocobalamin (VITAMIN B12) 1000 MCG/ML injection INJECT ONCE A MONTH   fluticasone (FLONASE) 50 MCG/ACT nasal spray Place 2 sprays into both nostrils daily.   hydrochlorothiazide (HYDRODIURIL) 12.5 MG tablet Take  1 tablet (12.5 mg total) by mouth daily. (Patient taking differently: Take 12.5 mg by mouth as needed.)   levothyroxine (SYNTHROID) 50 MCG tablet Take 1 tablet (50 mcg total) by mouth daily.   losartan (COZAAR) 50 MG tablet Take 1 tablet (50 mg total) by mouth daily.   Multiple Vitamins-Minerals (OCUVITE ADULT 50+ PO) Take 1 tablet by mouth daily.   simvastatin (ZOCOR) 20 MG tablet Take 1 tablet (20 mg total) by mouth daily.   vitamin C (ASCORBIC ACID) 500 MG tablet Take 500 mg by mouth daily.   [DISCONTINUED] albuterol (VENTOLIN HFA) 108 (90 Base) MCG/ACT inhaler INHALE 2 PUFFS 4 TIMES A DAY       03/24/2023    1:35 PM 01/19/2023    3:11 PM 12/22/2022    1:28 PM 09/25/2022   10:48 AM  GAD 7 : Generalized Anxiety Score  Nervous, Anxious, on Edge 0 0 0 0  Control/stop worrying 0 0 0 0  Worry too much - different things 0 0 0 0  Trouble relaxing 0 0 0 0  Restless 0 0 0 0  Easily annoyed or irritable 0 0 0 0  Afraid - awful might happen 0 0 0 0  Total GAD 7 Score 0 0 0 0  Anxiety Difficulty Not difficult at all Not difficult at all Not difficult at all Not difficult at all       03/24/2023    1:35 PM 01/19/2023    3:11 PM 12/22/2022    1:28 PM  Depression screen PHQ 2/9  Decreased Interest 0 0 0  Down, Depressed, Hopeless 0 0 0  PHQ - 2 Score 0 0 0  Altered sleeping 0 0 0  Tired, decreased energy 0 0 0  Change in appetite 0 0 0  Feeling bad or failure about yourself  0 0 0  Trouble concentrating 0 0 0  Moving slowly or fidgety/restless 0 0 0  Suicidal thoughts 0 0 0  PHQ-9 Score 0 0 0  Difficult doing work/chores Not difficult at all Not difficult at all Not difficult at all    BP Readings from Last 3 Encounters:  03/24/23 138/78  03/19/23 (!) 158/91  01/19/23 124/70    Physical Exam Vitals and nursing note reviewed.  HENT:     Right Ear: Tympanic membrane normal.     Left Ear: Tympanic membrane normal.     Nose: Nose normal.  Eyes:     Pupils: Pupils are equal,  round, and reactive to light.  Cardiovascular:     Rate and Rhythm: Normal rate and regular rhythm.     Heart sounds: No murmur heard.    No friction rub. No gallop.  Pulmonary:     Breath sounds: No wheezing, rhonchi or rales.  Abdominal:     Palpations: Abdomen is soft. There is no hepatomegaly or splenomegaly.  Musculoskeletal:     Cervical back: Neck supple.  Neurological:     Mental Status: She is alert.     Wt Readings from Last 3 Encounters:  03/24/23 137 lb (62.1 kg)  03/19/23 136 lb 3.9 oz (61.8 kg)  01/19/23 139 lb (63 kg)    BP 138/78   Pulse 60   Ht 5\' 2"  (1.575 m)   Wt 137 lb (62.1 kg)   SpO2 94%   BMI 25.06 kg/m   Assessment and Plan: 1. Symptomatic bradycardia As noted above patient underwent pacemaker for resolution of symptomatic bradycardia and currently is doing well with improved stamina and no shortness of breath with exercise or walking.    Elizabeth Sauer, MD

## 2023-04-01 ENCOUNTER — Ambulatory Visit: Payer: Medicare HMO

## 2023-04-01 ENCOUNTER — Ambulatory Visit (INDEPENDENT_AMBULATORY_CARE_PROVIDER_SITE_OTHER): Payer: Medicare HMO

## 2023-04-01 VITALS — Ht 62.0 in | Wt 137.0 lb

## 2023-04-01 DIAGNOSIS — Z0000 Encounter for general adult medical examination without abnormal findings: Secondary | ICD-10-CM | POA: Diagnosis not present

## 2023-04-01 DIAGNOSIS — I495 Sick sinus syndrome: Secondary | ICD-10-CM | POA: Diagnosis not present

## 2023-04-01 DIAGNOSIS — I130 Hypertensive heart and chronic kidney disease with heart failure and stage 1 through stage 4 chronic kidney disease, or unspecified chronic kidney disease: Secondary | ICD-10-CM | POA: Diagnosis not present

## 2023-04-01 LAB — CUP PACEART INCLINIC DEVICE CHECK
Battery Remaining Longevity: 119 mo
Battery Voltage: 3.2 V
Brady Statistic AP VP Percent: 98.73 %
Brady Statistic AP VS Percent: 0.01 %
Brady Statistic AS VP Percent: 1.05 %
Brady Statistic AS VS Percent: 0.21 %
Brady Statistic RA Percent Paced: 98.93 %
Brady Statistic RV Percent Paced: 99.78 %
Date Time Interrogation Session: 20240821154946
Implantable Lead Connection Status: 753985
Implantable Lead Connection Status: 753985
Implantable Lead Implant Date: 20240807
Implantable Lead Implant Date: 20240807
Implantable Lead Location: 753859
Implantable Lead Location: 753860
Implantable Lead Model: 3830
Implantable Lead Model: 5076
Implantable Pulse Generator Implant Date: 20240807
Lead Channel Impedance Value: 342 Ohm
Lead Channel Impedance Value: 361 Ohm
Lead Channel Impedance Value: 456 Ohm
Lead Channel Impedance Value: 475 Ohm
Lead Channel Pacing Threshold Amplitude: 0.5 V
Lead Channel Pacing Threshold Amplitude: 0.875 V
Lead Channel Pacing Threshold Pulse Width: 0.4 ms
Lead Channel Pacing Threshold Pulse Width: 0.4 ms
Lead Channel Sensing Intrinsic Amplitude: 0 mV
Lead Channel Sensing Intrinsic Amplitude: 1.5 mV
Lead Channel Sensing Intrinsic Amplitude: 1.5 mV
Lead Channel Sensing Intrinsic Amplitude: 9.875 mV
Lead Channel Setting Pacing Amplitude: 3.5 V
Lead Channel Setting Pacing Amplitude: 3.5 V
Lead Channel Setting Pacing Pulse Width: 0.4 ms
Lead Channel Setting Sensing Sensitivity: 0.9 mV
Zone Setting Status: 755011
Zone Setting Status: 755011

## 2023-04-01 NOTE — Patient Instructions (Signed)
Carly Marshall , Thank you for taking time to come for your Medicare Wellness Visit. I appreciate your ongoing commitment to your health goals. Please review the following plan we discussed and let me know if I can assist you in the future.   These are the goals we discussed:  Goals      DIET - INCREASE WATER INTAKE     Recommend to drink at least 6-8 8oz glasses of water per day.     DIET - INCREASE WATER INTAKE     "Keep getting better"     Stay Active and Independent-Low Back Pain         Why is this important?   Regular activity or exercise is important to managing back pain.  Activity helps to keep your muscles strong.  You will sleep better and feel more relaxed.  You will have more energy and feel less stressed.  If you are not active now, start slowly. Little changes make a big difference.  Rest, but not too much.  Stay as active as you can and listen to your body's signals.           This is a list of the screening recommended for you and due dates:  Health Maintenance  Topic Date Due   Zoster (Shingles) Vaccine (1 of 2) Never done   COVID-19 Vaccine (4 - 2023-24 season) 04/09/2023*   Flu Shot  11/09/2023*   Medicare Annual Wellness Visit  03/31/2024   DTaP/Tdap/Td vaccine (2 - Td or Tdap) 03/26/2025   Pneumonia Vaccine  Completed   DEXA scan (bone density measurement)  Completed   HPV Vaccine  Aged Out  *Topic was postponed. The date shown is not the original due date.   Health Maintenance After Age 27 After age 16, you are at a higher risk for certain long-term diseases and infections as well as injuries from falls. Falls are a major cause of broken bones and head injuries in people who are older than age 40. Getting regular preventive care can help to keep you healthy and well. Preventive care includes getting regular testing and making lifestyle changes as recommended by your health care provider. Talk with your health care provider about: Which screenings and  tests you should have. A screening is a test that checks for a disease when you have no symptoms. A diet and exercise plan that is right for you. What should I know about screenings and tests to prevent falls? Screening and testing are the best ways to find a health problem early. Early diagnosis and treatment give you the best chance of managing medical conditions that are common after age 41. Certain conditions and lifestyle choices may make you more likely to have a fall. Your health care provider may recommend: Regular vision checks. Poor vision and conditions such as cataracts can make you more likely to have a fall. If you wear glasses, make sure to get your prescription updated if your vision changes. Medicine review. Work with your health care provider to regularly review all of the medicines you are taking, including over-the-counter medicines. Ask your health care provider about any side effects that may make you more likely to have a fall. Tell your health care provider if any medicines that you take make you feel dizzy or sleepy. Strength and balance checks. Your health care provider may recommend certain tests to check your strength and balance while standing, walking, or changing positions. Foot health exam. Foot pain and numbness, as  well as not wearing proper footwear, can make you more likely to have a fall. Screenings, including: Osteoporosis screening. Osteoporosis is a condition that causes the bones to get weaker and break more easily. Blood pressure screening. Blood pressure changes and medicines to control blood pressure can make you feel dizzy. Depression screening. You may be more likely to have a fall if you have a fear of falling, feel depressed, or feel unable to do activities that you used to do. Alcohol use screening. Using too much alcohol can affect your balance and may make you more likely to have a fall. Follow these instructions at home: Lifestyle Do not drink  alcohol if: Your health care provider tells you not to drink. If you drink alcohol: Limit how much you have to: 0-1 drink a day for women. 0-2 drinks a day for men. Know how much alcohol is in your drink. In the [place] one drink equals one 12 oz bottle of beer (355 mL), one 5 oz glass of wine (148 mL), or one 1 oz glass of hard liquor (44 mL). Do not use any products that contain nicotine or tobacco. These products include cigarettes, chewing tobacco, and vaping devices, such as e-cigarettes. If you need help quitting, ask your health care provider. Activity  Follow a regular exercise program to stay fit. This will help you maintain your balance. Ask your health care provider what types of exercise are appropriate for you. If you need a cane or walker, use it as recommended by your health care provider. Wear supportive shoes that have nonskid soles. Safety  Remove any tripping hazards, such as rugs, cords, and clutter. Install safety equipment such as grab bars in bathrooms and safety rails on stairs. Keep rooms and walkways well-lit. General instructions Talk with your health care provider about your risks for falling. Tell your health care provider if: You fall. Be sure to tell your health care provider about all falls, even ones that seem minor. You feel dizzy, tiredness (fatigue), or off-balance. Take over-the-counter and prescription medicines only as told by your health care provider. These include supplements. Eat a healthy diet and maintain a healthy weight. A healthy diet includes low-fat dairy products, low-fat (lean) meats, and fiber from whole grains, beans, and lots of fruits and vegetables. Stay current with your vaccines. Schedule regular health, dental, and eye exams. Summary Having a healthy lifestyle and getting preventive care can help to protect your health and wellness after age 24. Screening and testing are the best way to find a health problem early and help you  avoid having a fall. Early diagnosis and treatment give you the best chance for managing medical conditions that are more common for people who are older than age 61. Falls are a major cause of broken bones and head injuries in people who are older than age 90. Take precautions to prevent a fall at home. Work with your health care provider to learn what changes you can make to improve your health and wellness and to prevent falls. This information is not intended to replace advice given to you by your health care provider. Make sure you discuss any questions you have with your health care provider. Document Revised: 12/17/2020 Document Reviewed: 12/17/2020 Elsevier Patient Education  2024 [employer].

## 2023-04-01 NOTE — Progress Notes (Signed)
Subjective:   Carly Marshall is a 87 y.o. female who presents for Medicare Annual (Subsequent) preventive examination.  Visit Complete: Virtual  I connected with  Carly Marshall on 04/01/23 by a audio enabled telemedicine application and verified that I am speaking with the correct person using two identifiers.  Patient Location: Home  Provider Location: Office/Clinic  I discussed the limitations of evaluation and management by telemedicine. The patient expressed understanding and agreed to proceed.      Objective:    Today's Vitals   04/01/23 1005 04/01/23 1007  Weight: 137 lb (62.1 kg)   Height: 5\' 2"  (1.575 m)   PainSc:  0-No pain   Body mass index is 25.06 kg/m.     04/01/2023   10:13 AM 03/19/2023   12:36 PM 03/16/2023    2:41 PM 03/25/2021    1:37 PM 03/21/2020    1:37 PM 03/14/2019    1:38 PM 02/08/2019    3:32 AM  Advanced Directives  Does Patient Have a Medical Advance Directive? No No  Yes Yes Yes No  Type of Aeronautical engineer of [place];Living will Healthcare Power of [place];Living will Healthcare Power of [place];Living will   Copy of Healthcare Power of Attorney in Chart?    Yes - validated most recent copy scanned in chart (See row information) Yes - validated most recent copy scanned in chart (See row information) No - copy requested   Would patient like information on creating a medical advance directive? No - Patient declined No - Patient declined     No - Patient declined     Information is confidential and restricted. Go to Review Flowsheets to unlock data.    Current Medications (verified) Outpatient Encounter Medications as of 04/01/2023  Medication Sig   amLODipine (NORVASC) 5 MG tablet Take 1 tablet (5 mg total) by mouth 2 (two) times daily.   Calcium Carb-Cholecalciferol (CALCIUM 600 + D PO) Take 1 capsule by mouth 2 (two) times daily.   Cholecalciferol (VITAMIN D3) 50 MCG (2000 UT) CAPS Take 1 capsule by mouth daily.    clopidogrel (PLAVIX) 75 MG tablet TAKE 1 TABLET BY MOUTH EVERY DAY   cyanocobalamin (VITAMIN B12) 1000 MCG/ML injection INJECT ONCE A MONTH   fluticasone (FLONASE) 50 MCG/ACT nasal spray Place 2 sprays into both nostrils daily.   hydrochlorothiazide (HYDRODIURIL) 12.5 MG tablet Take 1 tablet (12.5 mg total) by mouth daily. (Patient taking differently: Take 12.5 mg by mouth as needed.)   levothyroxine (SYNTHROID) 50 MCG tablet TAKE 1 TABLET BY MOUTH EVERY DAY   losartan (COZAAR) 50 MG tablet Take 1 tablet (50 mg total) by mouth daily.   Multiple Vitamins-Minerals (OCUVITE ADULT 50+ PO) Take 1 tablet by mouth daily.   simvastatin (ZOCOR) 20 MG tablet Take 1 tablet (20 mg total) by mouth daily.   vitamin C (ASCORBIC ACID) 500 MG tablet Take 500 mg by mouth daily.   No facility-administered encounter medications on file as of 04/01/2023.    Allergies (verified) Patient has no known allergies.   History: Past Medical History:  Diagnosis Date   Allergy    B12 deficiency anemia    Hyperlipidemia    Hypertension    Stroke [facility])    Thyroid disease    Past Surgical History:  Procedure Laterality Date   COLONOSCOPY  2012   normal- Dr Henrene Hawking   ECTOPIC PREGNANCY SURGERY     PACEMAKER IMPLANT N/A 03/18/2023   Procedure: PACEMAKER  IMPLANT;  Surgeon: Maurice Small, MD;  Location: [facility] INVASIVE CV LAB;  Service: Cardiovascular;  Laterality: N/A;   Family History  Problem Relation Age of Onset   Diabetes Mother    Heart disease Mother    Fibromyalgia Mother    Heart disease Father    Hyperlipidemia Father    Hypertension Father    Diabetes Sister    Brain cancer Brother    Rheum arthritis Sister    Social History   Socioeconomic History   Marital status: Widowed    Spouse name: Not on file   Number of children: 2   Years of education: Not on file   Highest education level: 12th grade  Occupational History   Occupation: Retired   Occupation: Insurance risk surveyor: [employer]  Tobacco Use   Smoking status: Never   Smokeless tobacco: Never   Tobacco comments:    smoking cessation materials not required  Vaping Use   Vaping status: Never Used  Substance and Sexual Activity   Alcohol use: No    Alcohol/week: 0.0 standard drinks of alcohol   Drug use: No   Sexual activity: Not Currently  Other Topics Concern   Not on file  Social History Narrative   Husband passed away 08-Jun-2019  Social Determinants of Health   Financial Resource Strain: Low Risk  (04/01/2023)   Overall Financial Resource Strain (CARDIA)    Difficulty of Paying Living Expenses: Not hard at all  Food Insecurity: No Food Insecurity (04/01/2023)   Hunger Vital Sign    Worried About Running Out of Food in the Last Year: Never true    Ran Out of Food in the Last Year: Never true  Transportation Needs: No Transportation Needs (04/01/2023)   PRAPARE - Administrator, Civil Service (Medical): No    Lack of Transportation (Non-Medical): No  Physical Activity: Sufficiently Active (04/01/2023)   Exercise Vital Sign    Days of Exercise per Week: 5 days    Minutes of Exercise per Session: 30 min  Stress: No Stress Concern Present (04/01/2023)   [employer] of Occupational Health - Occupational Stress Questionnaire    Feeling of Stress : Not at all  Social Connections: Moderately Integrated (04/01/2023)   Social Connection and Isolation Panel [NHANES]    Frequency of Communication with Friends and Family: More than three times a week    Frequency of Social Gatherings with Friends and Family: More than three times a week    Attends Religious Services: More than 4 times per year    Active Member of [employer] or Organizations: Yes    Attends Banker Meetings: More than 4 times per year    Marital Status: Widowed    Tobacco Counseling Counseling given: Yes Tobacco comments: smoking cessation materials not required   Clinical Intake:  Pre-visit  preparation completed: No  Pain : No/denies pain Pain Score: 0-No pain     BMI - recorded: 25.06 Nutritional Status: BMI 25 -29 Overweight Nutritional Risks: None Diabetes: No  How often do you need to have someone help you when you read instructions, pamphlets, or other written materials from your doctor or pharmacy?: 1 - Never  Interpreter Needed?: No  Information entered by :: Arthur Holms   Activities of Daily Living    04/01/2023   10:08 AM 03/17/2023    4:20 PM  In your present state of health, do you have any difficulty performing the following  activities:  Hearing? 0 0  Vision? 0 0  Difficulty concentrating or making decisions? 0 0  Walking or climbing stairs? 0 0  Dressing or bathing? 0 0  Doing errands, shopping? 1 1  Preparing Food and eating ? N   Using the Toilet? N   In the past six months, have you accidently leaked urine? N   Do you have problems with loss of bowel control? N   Managing your Medications? N   Managing your Finances? N   Housekeeping or managing your Housekeeping? N     Patient Care Team: Duanne Limerick, MD as PCP - General (Family Medicine) Jasper Riling, MD as Consulting Physician (Nephrology)  Indicate any recent Medical Services you may have received from other than [facility] providers in the past year (date may be approximate).     Assessment:   This is a routine wellness examination for [place].  Hearing/Vision screen Hearing Screening - Comments:: Pt is hearing well over phone Vision Screening - Comments:: Wears readers  Dietary issues and exercise activities discussed:     Goals Addressed             This Visit's Progress    DIET - INCREASE WATER INTAKE       "Keep getting better"       Depression Screen    04/01/2023   10:15 AM 04/01/2023   10:12 AM 03/24/2023    1:35 PM 01/19/2023    3:11 PM 12/22/2022    1:28 PM 09/25/2022   10:48 AM 09/22/2022    2:12 PM  PHQ 2/9 Scores  PHQ - 2 Score 0 0 0 0 0 0 0   PHQ- 9 Score 0 0 0 0 0 0 0    Fall Risk    04/01/2023   10:14 AM 03/24/2023    1:35 PM 01/19/2023    3:11 PM 12/22/2022    1:27 PM 09/25/2022   10:48 AM  Fall Risk   Falls in the past year? 0 0 0 0 0  Number falls in past yr: 0 0 0 0 0  Injury with Fall? 0 0 0 0 0  Risk for fall due to : No Fall Risks No Fall Risks No Fall Risks No Fall Risks No Fall Risks  Follow up Falls evaluation completed Falls evaluation completed Falls evaluation completed Falls evaluation completed Falls evaluation completed    MEDICARE RISK AT HOME: Medicare Risk at Home Any stairs in or around the home?: No If so, are there any without handrails?: No Home free of loose throw rugs in walkways, pet beds, electrical cords, etc?: Yes Adequate lighting in your home to reduce risk of falls?: Yes Life alert?: No (advised to get) Use of a cane, walker or w/c?: Yes (walker) Grab bars in the bathroom?: Yes Shower chair or bench in shower?: Yes Elevated toilet seat or a handicapped toilet?: Yes    Cognitive Function:        04/01/2023   10:15 AM 03/26/2022    1:51 PM 03/21/2020    2:36 PM 03/14/2019    1:47 PM 03/10/2018    1:40 PM  6CIT Screen  What Year? 0 points 0 points 0 points 0 points 0 points  What month? 0 points 0 points 0 points 0 points 0 points  What time? 0 points 0 points 0 points 0 points 0 points  Count back from 20 0 points 0 points 0 points 0 points 0 points  Months in reverse  0 points 0 points 0 points 0 points 0 points  Repeat phrase 0 points 0 points 0 points 0 points 2 points  Total Score 0 points 0 points 0 points 0 points 2 points    Immunizations Immunization History  Administered Date(s) Administered   Fluad Quad(high Dose 65+) 04/21/2022   Influenza, High Dose Seasonal PF 05/26/2017, 05/04/2018, 05/25/2019, 04/14/2020, 05/25/2021   Influenza,inj,Quad PF,6+ Mos 04/28/2016   Influenza-Unspecified 05/12/2015   PFIZER(Purple Top)SARS-COV-2 Vaccination 10/07/2019, 11/08/2019,  06/05/2020   PNEUMOCOCCAL CONJUGATE-20 06/18/2021   PPD Test 05/29/2015   Pneumococcal Polysaccharide-23 03/27/2015   Tdap 03/27/2015    TDAP status: Up to date  Flu Vaccine status: Up to date  Pneumococcal vaccine status: Up to date  Covid-19 vaccine status: Completed vaccines  Qualifies for Shingles Vaccine? Yes   Zostavax completed No   Shingrix Completed?: No.    Education has been provided regarding the importance of this vaccine. Patient has been advised to call insurance company to determine out of pocket expense if they have not yet received this vaccine. Advised may also receive vaccine at local pharmacy or Health Dept. Verbalized acceptance and understanding.  Screening Tests Health Maintenance  Topic Date Due   Zoster Vaccines- Shingrix (1 of 2) Never done   COVID-19 Vaccine (4 - 2023-24 season) 04/09/2023 (Originally 04/11/2022)   INFLUENZA VACCINE  11/09/2023 (Originally 03/12/2023)   Medicare Annual Wellness (AWV)  03/31/2024   DTaP/Tdap/Td (2 - Td or Tdap) 03/26/2025   Pneumonia Vaccine 43+ Years old  Completed   DEXA SCAN  Completed   HPV VACCINES  Aged Out    Health Maintenance  Health Maintenance Due  Topic Date Due   Zoster Vaccines- Shingrix (1 of 2) Never done    Colorectal cancer screening: Type of screening: Colonoscopy. Completed "early 69s". Repeat every aged out years  Mammogram status: Completed 02/28/2014. Repeat every year- pt does not want  Bone Density status: Completed 03/24/2018. Results reflect: Bone density results: OSTEOPENIA. Repeat every 2 years.- pt does not want  Lung Cancer Screening: (Low Dose CT Chest recommended if Age 66-80 years, 20 pack-year currently smoking OR have quit w/in 15years.) does not qualify.   Additional Screening:  Hepatitis C Screening: does qualify; Completed pt does not want  Vision Screening: Recommended annual ophthalmology exams for early detection of glaucoma and other disorders of the eye. Is the  patient up to date with their annual eye exam?  Yes  Who is the provider or what is the name of the office in which the patient attends annual eye exams? Dr Theodis Aguas I  Dental Screening: Recommended annual dental exams for proper oral hygiene- advised to do   Community Resource Referral / Chronic Care Management: CRR required this visit?  No   CCM required this visit?  No     Plan:     I have personally reviewed and noted the following in the patient's chart:   Medical and social history Use of alcohol, tobacco or illicit drugs  Current medications and supplements including opioid prescriptions. Patient is not currently taking opioid prescriptions. Functional ability and status Nutritional status Physical activity Advanced directives List of other physicians Hospitalizations, surgeries, and ER visits in previous 12 months Vitals Screenings to include cognitive, depression, and falls Referrals and appointments  In addition, I have reviewed and discussed with patient certain preventive protocols, quality metrics, and best practice recommendations. A written personalized care plan for preventive services as well as general preventive health recommendations were provided to  patient.     Everitt Amber   04/01/2023   After Visit Summary: (MyChart) Due to this being a telephonic visit, the after visit summary with patients personalized plan was offered to patient via MyChart   Nurse Notes: none needed

## 2023-04-01 NOTE — Progress Notes (Signed)

## 2023-04-01 NOTE — Patient Instructions (Signed)

## 2023-04-02 LAB — BASIC METABOLIC PANEL
BUN/Creatinine Ratio: 16 (ref 12–28)
BUN: 21 mg/dL (ref 8–27)
CO2: 21 mmol/L (ref 20–29)
Calcium: 9.4 mg/dL (ref 8.7–10.3)
Chloride: 106 mmol/L (ref 96–106)
Creatinine, Ser: 1.33 mg/dL — ABNORMAL HIGH (ref 0.57–1.00)
Glucose: 101 mg/dL — ABNORMAL HIGH (ref 70–99)
Potassium: 4.3 mmol/L (ref 3.5–5.2)
Sodium: 144 mmol/L (ref 134–144)
eGFR: 39 mL/min/{1.73_m2} — ABNORMAL LOW (ref >59)

## 2023-04-09 NOTE — Progress Notes (Signed)
Cardiology Office Note  Date:  04/10/2023   ID:  Carly Marshall, Carly Marshall 1935-08-24, MRN [id number]  PCP:  Duanne Limerick, MD   Chief Complaint  Patient presents with   New Patient (Initial Visit)    Establish care for Sinus node dysfunction/symptomatic bradycardia/advanced heart block Mobitz 2 Pacemaker & Hypertension. "Doing well." Medications reviewed by the patient verbally.      HPI:  Carly Marshall is a 87 year old woman with past medical history of Sinus node dysfunction/symptomatic bradycardia/advanced heart block Mobitz 2 Pacemaker Hypertension Hyperlipidemia Chronic kidney disease stage III, single kidney TIA Hypertensive heart disease Aortic atherosclerosis   Implantation of a MDT dual chamber PPM on 03/18/23 by Dr Nelly Laurence.   Recent discussions concerning cardiac history She reports that she was feeling poorly for >1 month, tired, no energy   referred  by  visiting insurance nurse who noted her to be bradycardic.   Seen in the emergency room March 16, 2023 for bradycardia There she was found to have SB, as well as advanced AV block including Mobiz I w periods of persistent 2:1 block and rates into the 30's with no reversible causes transferred to [facility] for further management    underwent implantation of a PPM   Echo 8/24 Left ventricular ejection fraction, by estimation, is 60 to 65%   Since pacemaker placement, reports that she has felt much better, better energy, Able to walk long distances without having to stop  HCTZ recently held by nephrology for creatinine of 2  She is concerned that BP elevated at home  140 systolic, sometimes 150 systolic  Off hydrochlorothiazide, cvr improved 2.0 down to 1.3  EKG personally reviewed by myself on todays visit .EKG Interpretation Date/Time:  Friday April 10 2023 15:41:34 EDT Ventricular Rate:  60 PR Interval:  178 QRS Duration:  158 QT Interval:  474 QTC Calculation: 474 R Axis:   -18  Text Interpretation: AV  dual-paced rhythm When compared with ECG of 19-Mar-2023 07:08, No significant change was found Confirmed by Julien Nordmann [phone]) on 04/10/2023 3:52:58 PM    PMH:   has a past medical history of Allergy, B12 deficiency anemia, Hyperlipidemia, Hypertension, Stroke (HCC), and Thyroid disease.  PSH:    Past Surgical History:  Procedure Laterality Date   COLONOSCOPY  2012   normal- Dr Henrene Hawking   ECTOPIC PREGNANCY SURGERY     PACEMAKER IMPLANT N/A 03/18/2023   Procedure: PACEMAKER IMPLANT;  Surgeon: Maurice Small, MD;  [facility]: [facility];  Service: Cardiovascular;  Laterality: N/A;    Current Outpatient Medications  Medication Sig Dispense Refill   amLODipine (NORVASC) 5 MG tablet Take 1 tablet (5 mg total) by mouth 2 (two) times daily. 180 tablet 1   Calcium Carb-Cholecalciferol (CALCIUM 600 + D PO) Take 1 capsule by mouth 2 (two) times daily.     Cholecalciferol (VITAMIN D3) 50 MCG (2000 UT) CAPS Take 1 capsule by mouth daily.     clopidogrel (PLAVIX) 75 MG tablet TAKE 1 TABLET BY MOUTH EVERY DAY 90 tablet 0   cyanocobalamin (VITAMIN B12) 1000 MCG/ML injection INJECT ONCE A MONTH 3 mL 0   fluticasone (FLONASE) 50 MCG/ACT nasal spray Place 2 sprays into both nostrils daily. 16 g 5   hydrochlorothiazide (HYDRODIURIL) 12.5 MG tablet Take 12.5 mg by mouth daily as needed.     levothyroxine (SYNTHROID) 50 MCG tablet TAKE 1 TABLET BY MOUTH EVERY DAY 90 tablet 0   losartan (COZAAR) 50 MG tablet Take  1 tablet (50 mg total) by mouth daily. 90 tablet 1   magnesium oxide (MAG-OX) 400 (240 Mg) MG tablet Take 400 mg by mouth every other day.     Multiple Vitamins-Minerals (OCUVITE ADULT 50+ PO) Take 1 tablet by mouth daily.     simvastatin (ZOCOR) 20 MG tablet Take 1 tablet (20 mg total) by mouth daily. 90 tablet 1   vitamin C (ASCORBIC ACID) 500 MG tablet Take 500 mg by mouth daily.     No current facility-administered medications for this visit.     Allergies:   Patient has no  known allergies.   Social History:  The patient  reports that she has never smoked. She has never used smokeless tobacco. She reports that she does not drink alcohol and does not use drugs.   Family History:   family history includes Brain cancer in her brother; Diabetes in her mother and sister; Fibromyalgia in her mother; Heart disease in her father and mother; Hyperlipidemia in her father; Hypertension in her father; Rheum arthritis in her sister.    Review of Systems: Review of Systems  Constitutional: Negative.   HENT: Negative.    Respiratory: Negative.    Cardiovascular: Negative.   Gastrointestinal: Negative.   Musculoskeletal: Negative.   Neurological: Negative.   Psychiatric/Behavioral: Negative.    All other systems reviewed and are negative.    PHYSICAL EXAM: VS:  BP 124/60 (BP Location: Left Arm, Patient Position: Sitting, Cuff Size: Normal)   Ht 5\' 2"  (1.575 m)   Wt 138 lb 6 oz (62.8 kg)   SpO2 97%   BMI 25.31 kg/m  , BMI Body mass index is 25.31 kg/m. GEN: Well nourished, well developed, in no acute distress HEENT: normal Neck: no JVD, carotid bruits, or masses Cardiac: RRR; no murmurs, rubs, or gallops,no edema  Respiratory:  clear to auscultation bilaterally, normal work of breathing GI: soft, nontender, nondistended, + BS MS: no deformity or atrophy Skin: warm and dry, no rash Neuro:  Strength and sensation are intact Psych: euthymic mood, full affect    Recent Labs: 04/21/2022: ALT 9 03/16/2023: TSH 2.448 03/17/2023: Hemoglobin 11.4; Magnesium 2.2; Platelets 244 04/01/2023: BUN 21; Creatinine, Ser 1.33; Potassium 4.3; Sodium 144    Lipid Panel Lab Results  Component Value Date   CHOL 144 12/22/2022   HDL 77 12/22/2022   LDLCALC 54 12/22/2022   TRIG 65 12/22/2022      Wt Readings from Last 3 Encounters:  04/10/23 138 lb 6 oz (62.8 kg)  04/01/23 137 lb (62.1 kg)  03/24/23 137 lb (62.1 kg)       ASSESSMENT AND PLAN:  Problem List Items  Addressed This Visit       Cardiology Problems   Sinus node dysfunction (HCC)   Relevant Medications   hydrochlorothiazide (HYDRODIURIL) 12.5 MG tablet   Other Relevant Orders   EKG 12-Lead   Essential hypertension   Relevant Medications   hydrochlorothiazide (HYDRODIURIL) 12.5 MG tablet   Other Relevant Orders   EKG 12-Lead   Hyperlipidemia   Relevant Medications   hydrochlorothiazide (HYDRODIURIL) 12.5 MG tablet     Other   Hypothyroidism   Anemia in chronic kidney disease   Symptomatic bradycardia   Relevant Orders   EKG 12-Lead   CKD stage 3b, GFR 30-44 ml/min (HCC)   Other Visit Diagnoses     Pacemaker    -  Primary   Hypertensive heart and chronic kidney disease with heart failure and stage 1 through stage  4 chronic kidney disease, or chronic kidney disease (HCC)       Relevant Medications   hydrochlorothiazide (HYDRODIURIL) 12.5 MG tablet   Other Relevant Orders   EKG 12-Lead      Sinus bradycardia/sick sinus syndrome -- Status post pacemaker, followed by [employer] much improved  Essential hypertension Blood pressure running high at home since stopping hydrochlorothiazide Recommended to increase losartan up to 50 twice daily given renal function improved down to creatinine 1.3 For any ankle swelling, recommended HCTZ 12.5 daily as needed, not to exceed 2 times a week Recommend close monitoring of blood pressure  Chronic renal failure Creatinine improved down to 1.3 after holding hydrochlorothiazide Followed by nephrology  Chronic anemia Hemoglobin stable 11.4    Total encounter time more than 50 minutes  Greater than 50% was spent in counseling and coordination of care with the patient    Signed, Dossie Arbour, M.D., Ph.D. [facility] [place], [place] [phone]

## 2023-04-10 ENCOUNTER — Encounter: Payer: Self-pay | Admitting: Cardiovascular Disease

## 2023-04-10 ENCOUNTER — Ambulatory Visit: Payer: Medicare HMO | Attending: Cardiovascular Disease | Admitting: Cardiovascular Disease

## 2023-04-10 VITALS — BP 138/60 | HR 60 | Ht 62.0 in | Wt 138.4 lb

## 2023-04-10 DIAGNOSIS — N189 Chronic kidney disease, unspecified: Secondary | ICD-10-CM | POA: Diagnosis not present

## 2023-04-10 DIAGNOSIS — I10 Essential (primary) hypertension: Secondary | ICD-10-CM | POA: Diagnosis not present

## 2023-04-10 DIAGNOSIS — E782 Mixed hyperlipidemia: Secondary | ICD-10-CM

## 2023-04-10 DIAGNOSIS — Z950 Presence of cardiac pacemaker: Secondary | ICD-10-CM

## 2023-04-10 DIAGNOSIS — R001 Bradycardia, unspecified: Secondary | ICD-10-CM

## 2023-04-10 DIAGNOSIS — E039 Hypothyroidism, unspecified: Secondary | ICD-10-CM | POA: Diagnosis not present

## 2023-04-10 DIAGNOSIS — N1832 Chronic kidney disease, stage 3b: Secondary | ICD-10-CM | POA: Diagnosis not present

## 2023-04-10 DIAGNOSIS — D631 Anemia in chronic kidney disease: Secondary | ICD-10-CM

## 2023-04-10 DIAGNOSIS — I130 Hypertensive heart and chronic kidney disease with heart failure and stage 1 through stage 4 chronic kidney disease, or unspecified chronic kidney disease: Secondary | ICD-10-CM | POA: Diagnosis not present

## 2023-04-10 DIAGNOSIS — I495 Sick sinus syndrome: Secondary | ICD-10-CM

## 2023-04-10 MED ORDER — HYDROCHLOROTHIAZIDE 12.5 MG PO TABS: 12.5000 mg | ORAL_TABLET | Freq: Every day | ORAL | 72 refills | Status: DC | PRN

## 2023-04-10 MED ORDER — LOSARTAN POTASSIUM 50 MG PO TABS: 50.0000 mg | ORAL_TABLET | Freq: Two times a day (BID) | ORAL | 3 refills | Status: DC

## 2023-04-10 NOTE — Patient Instructions (Addendum)
Medication Instructions:  Please increase losartan up to 50 mg twice a day  Hydrochlorothiazide as needed for ankle swelling, consider 2x a week if blood pressure stays elevated  If you need a refill on your cardiac medications before your next appointment, please call your pharmacy.   Lab work: No new labs needed  Testing/Procedures: No new testing needed  Follow-Up: At [facility], you and your health needs are our priority.  As part of our continuing mission to provide you with exceptional heart care, we have created designated Provider Care Teams.  These Care Teams include your primary Cardiologist (physician) and Advanced Practice Providers (APPs -  Physician Assistants and Nurse Practitioners) who all work together to provide you with the care you need, when you need it.  You will need a follow up appointment in 9 months  Providers on your designated Care Team:   Nicolasa Ducking, NP Eula Listen, PA-C Cadence Fransico [place], [place]  COVID-19 Vaccine Information can be found at: [email or web address] For questions related to vaccine distribution or appointments, please email [email or web address] or call [phone].

## 2023-04-27 ENCOUNTER — Ambulatory Visit: Payer: Self-pay | Admitting: *Deleted

## 2023-04-27 ENCOUNTER — Encounter: Payer: Self-pay | Admitting: Family Medicine

## 2023-04-27 ENCOUNTER — Ambulatory Visit (INDEPENDENT_AMBULATORY_CARE_PROVIDER_SITE_OTHER): Payer: Medicare HMO | Admitting: Family Medicine

## 2023-04-27 VITALS — Ht 62.0 in | Wt 139.0 lb

## 2023-04-27 DIAGNOSIS — Z23 Encounter for immunization: Secondary | ICD-10-CM

## 2023-04-27 DIAGNOSIS — N1832 Chronic kidney disease, stage 3b: Secondary | ICD-10-CM

## 2023-04-27 DIAGNOSIS — I10 Essential (primary) hypertension: Secondary | ICD-10-CM | POA: Diagnosis not present

## 2023-04-27 NOTE — Telephone Encounter (Signed)
  Chief Complaint: Elevated BP since having her pacemaker inserted in Aug.  Cardiologist changed her BP medications  while in hospital and again after she was discharged.  Symptoms: Mild headaches on occasion Frequency: daily  Pertinent Negatives: Patient denies dizziness or chest pain Disposition: [] ED /[] Urgent Care (no appt availability in office) / [x] Appointment(In office/virtual)/ []  [facility]/ [] Home Care/ [] Refused Recommended Disposition /[] [facility]/ []  Follow-up with PCP Additional Notes: Appt made for today with Dr Yetta Barre for 4:20.

## 2023-04-27 NOTE — Progress Notes (Signed)
Date:  04/27/2023   Name:  Carly Marshall   DOB:  Dec 30, 1935   MRN:  [id number]   Chief Complaint: Hypertension (Has been getting high readings at home with a brachial cuff)  Hypertension This is a chronic problem. The current episode started 1 to 4 weeks ago. The problem has been gradually worsening since onset. The problem is uncontrolled. Pertinent negatives include no chest pain, headaches, orthopnea, palpitations, peripheral edema, PND or shortness of breath. There are no associated agents to hypertension. Past treatments include angiotensin blockers, diuretics and calcium channel blockers. The current treatment provides moderate improvement. There are no compliance problems.  There is no history of CAD/MI, CVA or left ventricular hypertrophy. There is no history of chronic renal disease, a hypertension causing med or renovascular disease.    Lab Results  Component Value Date   NA 144 04/01/2023   K 4.3 04/01/2023   CO2 21 04/01/2023   GLUCOSE 101 (H) 04/01/2023   BUN 21 04/01/2023   CREATININE 1.33 (H) 04/01/2023   CALCIUM 9.4 04/01/2023   EGFR 39 (L) 04/01/2023   GFRNONAA 34 (L) 03/19/2023   Lab Results  Component Value Date   CHOL 144 12/22/2022   HDL 77 12/22/2022   LDLCALC 54 12/22/2022   TRIG 65 12/22/2022   CHOLHDL 2.6 02/08/2019   Lab Results  Component Value Date   TSH 2.448 03/16/2023   Lab Results  Component Value Date   HGBA1C 5.7 (H) 12/22/2022   Lab Results  Component Value Date   WBC 5.8 03/17/2023   HGB 11.4 (L) 03/17/2023   HCT 35.8 (L) 03/17/2023   MCV 93.7 03/17/2023   PLT 244 03/17/2023   Lab Results  Component Value Date   ALT 9 04/21/2022   AST 21 04/21/2022   ALKPHOS 127 (H) 04/21/2022   BILITOT 0.6 04/21/2022   No results found for: "25OHVITD2", "25OHVITD3", "VD25OH"   Review of Systems  Respiratory:  Negative for shortness of breath and wheezing.   Cardiovascular:  Negative for chest pain, palpitations, orthopnea, leg  swelling and PND.  Neurological:  Negative for headaches.    Patient Active Problem List   Diagnosis Date Noted   Sinus node dysfunction (HCC) 03/17/2023   Symptomatic bradycardia 03/16/2023   CKD stage 3b, GFR 30-44 ml/min (HCC) 03/16/2023   Hypermagnesemia 03/16/2023   AKI (acute kidney injury) (HCC) 03/16/2023   Edema of lower extremity 08/30/2020   Anemia in chronic kidney disease 08/30/2020   Chronic venous insufficiency 05/11/2020   Aphasia 02/07/2019   Chronic kidney disease, stage 4 (severe) (HCC) 02/07/2019   B12 deficiency 04/28/2016   Essential hypertension 03/27/2015   Hypothyroidism 03/27/2015   Hyperlipidemia 03/27/2015   Allergic rhinitis due to pollen 03/27/2015    No Known Allergies  Past Surgical History:  Procedure Laterality Date   COLONOSCOPY  2012   normal- Dr Henrene Hawking   ECTOPIC PREGNANCY SURGERY     PACEMAKER IMPLANT N/A 03/18/2023   Procedure: PACEMAKER IMPLANT;  Surgeon: Maurice Small, MD;  Location: [facility];  Service: Cardiovascular;  Laterality: N/A;    Social History   Tobacco Use   Smoking status: Never   Smokeless tobacco: Never   Tobacco comments:    smoking cessation materials not required  Vaping Use   Vaping status: Never Used  Substance Use Topics   Alcohol use: No    Alcohol/week: 0.0 standard drinks of alcohol   Drug use: No     Medication list  has been reviewed and updated.  Current Meds  Medication Sig   amLODipine (NORVASC) 5 MG tablet Take 1 tablet (5 mg total) by mouth 2 (two) times daily.   Calcium Carb-Cholecalciferol (CALCIUM 600 + D PO) Take 1 capsule by mouth 2 (two) times daily.   Cholecalciferol (VITAMIN D3) 50 MCG (2000 UT) CAPS Take 1 capsule by mouth daily.   clopidogrel (PLAVIX) 75 MG tablet TAKE 1 TABLET BY MOUTH EVERY DAY   cyanocobalamin (VITAMIN B12) 1000 MCG/ML injection INJECT ONCE A MONTH   fluticasone (FLONASE) 50 MCG/ACT nasal spray Place 2 sprays into both nostrils daily.    hydrochlorothiazide (HYDRODIURIL) 12.5 MG tablet Take 1 tablet (12.5 mg total) by mouth daily as needed (May take up to two times a week as needed for elevated blood pressure). (Patient taking differently: Take 12.5 mg by mouth 2 (two) times a week. [place])   levothyroxine (SYNTHROID) 50 MCG tablet TAKE 1 TABLET BY MOUTH EVERY DAY   losartan (COZAAR) 50 MG tablet Take 1 tablet (50 mg total) by mouth in the morning and at bedtime.   magnesium oxide (MAG-OX) 400 (240 Mg) MG tablet Take 400 mg by mouth every other day.   Multiple Vitamins-Minerals (OCUVITE ADULT 50+ PO) Take 1 tablet by mouth daily.   simvastatin (ZOCOR) 20 MG tablet Take 1 tablet (20 mg total) by mouth daily.   vitamin C (ASCORBIC ACID) 500 MG tablet Take 500 mg by mouth daily.       04/27/2023    4:26 PM 03/24/2023    1:35 PM 01/19/2023    3:11 PM 12/22/2022    1:28 PM  GAD 7 : Generalized Anxiety Score  Nervous, Anxious, on Edge 0 0 0 0  Control/stop worrying 0 0 0 0  Worry too much - different things 0 0 0 0  Trouble relaxing 0 0 0 0  Restless 0 0 0 0  Easily annoyed or irritable 0 0 0 0  Afraid - awful might happen 0 0 0 0  Total GAD 7 Score 0 0 0 0  Anxiety Difficulty Not difficult at all Not difficult at all Not difficult at all Not difficult at all       04/27/2023    4:26 PM 04/01/2023   10:15 AM 04/01/2023   10:12 AM  Depression screen PHQ 2/9  Decreased Interest 0 0 0  Down, Depressed, Hopeless 0 0 0  PHQ - 2 Score 0 0 0  Altered sleeping 0 0 0  Tired, decreased energy 0 0 0  Change in appetite 0 0 0  Feeling bad or failure about yourself  0 0 0  Trouble concentrating 0 0 0  Moving slowly or fidgety/restless 0 0 0  Suicidal thoughts 0 0 0  PHQ-9 Score 0 0 0  Difficult doing work/chores Not difficult at all Not difficult at all Not difficult at all    BP Readings from Last 3 Encounters:  04/10/23 138/60  03/24/23 138/78  03/19/23 (!) 158/91    Physical Exam Vitals and nursing note reviewed.   Neck:     Vascular: No JVD.  Cardiovascular:     Heart sounds: Normal heart sounds.     No systolic murmur is present.     No diastolic murmur is present.  Pulmonary:     Effort: Pulmonary effort is normal.     Breath sounds: Normal breath sounds.  Musculoskeletal:     Right lower leg: No edema.     Left  lower leg: No edema.  Neurological:     Mental Status: She is alert.     Wt Readings from Last 3 Encounters:  04/27/23 139 lb (63 kg)  04/10/23 138 lb 6 oz (62.8 kg)  04/01/23 137 lb (62.1 kg)    Ht 5\' 2"  (1.575 m)   Wt 139 lb (63 kg)   BMI 25.42 kg/m   Assessment and Plan:  1. Essential hypertension Chronic.  Uncontrolled.  Able.  Blood pressures have been elevated 150-170/60-90.  This is on the current dosing of losartan 50 mg twice a day amlodipine 5 mg twice a day and hydrochlorothiazide 12.5 mg twice a week.  I am reluctant to go up on the hydrochlorothiazide given the circumstance below so we will obtain a renal function panel to see with the current creatinine is as well as potassium.  I am wondering if our next realistic approach would be to switch to valsartan perhaps 80 mg twice a day. - Renal Function Panel  2. CKD stage 3b, GFR 30-44 ml/min (HCC) Chronic.  Persistent.  Patient has CKD and is followed by nephrology.  Patient recently was decreased or taken off of her hydrochlorothiazide by her nephrologist given that her creatinine elevated to 2.0.  We will obtain a renal function panel to see what current level is I am reluctant to go to a daily dosing probably no more than 3 times a week but I am concerned about the effects that this may have on her kidney function.  3. Flu vaccine need Discussed and administered - Flu Vaccine Trivalent High Dose (Fluad)    Elizabeth Sauer, MD

## 2023-04-27 NOTE — Telephone Encounter (Signed)
Reason for Disposition  Systolic BP  >= 160 OR Diastolic >= 100  Answer Assessment - Initial Assessment Questions 1. BLOOD PRESSURE: "What is the blood pressure?" "Did you take at least two measurements 5 minutes apart?"     Had a pacer put in on Aug. 5 or 6.   It's been high since then.    No pain or discomfort. 2. ONSET: "When did you take your blood pressure?"     167/74 this morning at 11:00AM.     A few little headaches.   No blurry vision. 3. HOW: "How did you take your blood pressure?" (e.g., automatic home BP monitor, visiting nurse)     Home monitor.   4. HISTORY: "Do you have a history of high blood pressure?"     Yes 5. MEDICINES: "Are you taking any medicines for blood pressure?" "Have you missed any doses recently?"     I had a cardiologist Aug. 30 appt.   At the hospital they took me off of my medicines and he put me back on at different doses.    I was hydrochlorothiazide but my kidney dr. Kem Parkinson it.   Dr. Lewie Loron put me back on it twice a week.   Also restarted my Losartan twice a day.    My mag. Was changed 6. OTHER SYMPTOMS: "Do you have any symptoms?" (e.g., blurred vision, chest pain, difficulty breathing, headache, weakness)     My daughter is a Engineer, civil (consulting) and told me not to take it every day.   It gets me worried.   Sept 4, 2024   125/65 in my right arm  7. PREGNANCY: "Is there any chance you are pregnant?" "When was your last menstrual period?"     Not asked due to age  Protocols used: Blood Pressure - High-A-AH

## 2023-04-28 ENCOUNTER — Other Ambulatory Visit: Payer: Self-pay

## 2023-04-28 DIAGNOSIS — I10 Essential (primary) hypertension: Secondary | ICD-10-CM

## 2023-04-28 LAB — RENAL FUNCTION PANEL
Albumin: 4.6 g/dL (ref 3.7–4.7)
BUN/Creatinine Ratio: 20 (ref 12–28)
BUN: 29 mg/dL — ABNORMAL HIGH (ref 8–27)
CO2: 22 mmol/L (ref 20–29)
Calcium: 10 mg/dL (ref 8.7–10.3)
Chloride: 100 mmol/L (ref 96–106)
Creatinine, Ser: 1.42 mg/dL — ABNORMAL HIGH (ref 0.57–1.00)
Glucose: 102 mg/dL — ABNORMAL HIGH (ref 70–99)
Phosphorus: 3.6 mg/dL (ref 3.0–4.3)
Potassium: 3.9 mmol/L (ref 3.5–5.2)
Sodium: 140 mmol/L (ref 134–144)
eGFR: 36 mL/min/{1.73_m2} — ABNORMAL LOW (ref >59)

## 2023-04-28 MED ORDER — VALSARTAN 80 MG PO TABS
80.0000 mg | ORAL_TABLET | Freq: Two times a day (BID) | ORAL | 1 refills | Status: AC
Start: 2023-04-28 — End: ?

## 2023-05-01 ENCOUNTER — Other Ambulatory Visit: Payer: Self-pay | Admitting: Family Medicine

## 2023-05-01 DIAGNOSIS — E782 Mixed hyperlipidemia: Secondary | ICD-10-CM

## 2023-05-12 ENCOUNTER — Ambulatory Visit (INDEPENDENT_AMBULATORY_CARE_PROVIDER_SITE_OTHER): Payer: Medicare HMO | Admitting: Family Medicine

## 2023-05-12 ENCOUNTER — Encounter: Payer: Self-pay | Admitting: Family Medicine

## 2023-05-12 VITALS — BP 134/60 | HR 69 | Ht 62.0 in | Wt 139.0 lb

## 2023-05-12 DIAGNOSIS — I10 Essential (primary) hypertension: Secondary | ICD-10-CM

## 2023-05-12 MED ORDER — VALSARTAN 80 MG PO TABS
80.0000 mg | ORAL_TABLET | Freq: Two times a day (BID) | ORAL | 1 refills | Status: DC
Start: 2023-05-12 — End: 2023-05-12

## 2023-05-12 MED ORDER — AMLODIPINE BESYLATE 5 MG PO TABS
5.0000 mg | ORAL_TABLET | Freq: Two times a day (BID) | ORAL | 1 refills | Status: DC
Start: 2023-05-12 — End: 2023-06-25

## 2023-05-12 MED ORDER — VALSARTAN 80 MG PO TABS
80.0000 mg | ORAL_TABLET | Freq: Two times a day (BID) | ORAL | 1 refills | Status: DC
Start: 2023-05-12 — End: 2023-06-25

## 2023-05-12 MED ORDER — AMLODIPINE BESYLATE 5 MG PO TABS
5.0000 mg | ORAL_TABLET | Freq: Two times a day (BID) | ORAL | 1 refills | Status: DC
Start: 2023-05-12 — End: 2023-05-12

## 2023-05-12 NOTE — Progress Notes (Signed)
Date:  05/12/2023   Name:  Carly Marshall   DOB:  [birth year]   MRN:  [id number]   Chief Complaint: Hypertension  Hypertension This is a chronic problem. The current episode started more than 1 year ago. The problem has been gradually improving since onset. The problem is controlled. Pertinent negatives include no anxiety, blurred vision, chest pain, headaches, malaise/fatigue, neck pain, orthopnea, palpitations, peripheral edema, PND or shortness of breath. There are no associated agents to hypertension. There are no compliance problems.  There is no history of CAD/MI or CVA.    Lab Results  Component Value Date   NA 140 04/27/2023   K 3.9 04/27/2023   CO2 22 04/27/2023   GLUCOSE 102 (H) 04/27/2023   BUN 29 (H) 04/27/2023   CREATININE 1.42 (H) 04/27/2023   CALCIUM 10.0 04/27/2023   EGFR 36 (L) 04/27/2023   GFRNONAA 34 (L) 03/19/2023   Lab Results  Component Value Date   CHOL 144 12/22/2022   HDL 77 12/22/2022   LDLCALC 54 12/22/2022   TRIG 65 12/22/2022   CHOLHDL 2.6 02/08/2019   Lab Results  Component Value Date   TSH 2.448 03/16/2023   Lab Results  Component Value Date   HGBA1C 5.7 (H) 12/22/2022   Lab Results  Component Value Date   WBC 5.8 03/17/2023   HGB 11.4 (L) 03/17/2023   HCT 35.8 (L) 03/17/2023   MCV 93.7 03/17/2023   PLT 244 03/17/2023   Lab Results  Component Value Date   ALT 9 04/21/2022   AST 21 04/21/2022   ALKPHOS 127 (H) 04/21/2022   BILITOT 0.6 04/21/2022   No results found for: "25OHVITD2", "25OHVITD3", "VD25OH"   Review of Systems  Constitutional:  Negative for malaise/fatigue and unexpected weight change.  HENT:  Negative for trouble swallowing.   Eyes:  Negative for blurred vision.  Respiratory:  Negative for cough, choking, chest tightness, shortness of breath, wheezing and stridor.   Cardiovascular:  Negative for chest pain, palpitations, orthopnea, leg swelling and PND.  Musculoskeletal:  Negative for neck pain.   Neurological:  Negative for headaches.    Patient Active Problem List   Diagnosis Date Noted   Sinus node dysfunction (HCC) 03/17/2023   Symptomatic bradycardia 03/16/2023   CKD stage 3b, GFR 30-44 ml/min (HCC) 03/16/2023   Hypermagnesemia 03/16/2023   AKI (acute kidney injury) (HCC) 03/16/2023   Edema of lower extremity 08/30/2020   Anemia in chronic kidney disease 08/30/2020   Chronic venous insufficiency 05/11/2020   Aphasia 02/07/2019   Chronic kidney disease, stage 4 (severe) (HCC) 02/07/2019   B12 deficiency 04/28/2016   Essential hypertension 03/27/2015   Hypothyroidism 03/27/2015   Hyperlipidemia 03/27/2015   Allergic rhinitis due to pollen 03/27/2015    No Known Allergies  Past Surgical History:  Procedure Laterality Date   COLONOSCOPY  2012   normal- Dr Henrene Hawking   ECTOPIC PREGNANCY SURGERY     PACEMAKER IMPLANT N/A 03/18/2023   Procedure: PACEMAKER IMPLANT;  Surgeon: Maurice Small, MD;  [facility]: [facility];  Service: Cardiovascular;  Laterality: N/A;    Social History   Tobacco Use   Smoking status: Never   Smokeless tobacco: Never   Tobacco comments:    smoking cessation materials not required  Vaping Use   Vaping status: Never Used  Substance Use Topics   Alcohol use: No    Alcohol/week: 0.0 standard drinks of alcohol   Drug use: No     Medication list has  been reviewed and updated.  Current Meds  Medication Sig   amLODipine (NORVASC) 5 MG tablet Take 1 tablet (5 mg total) by mouth 2 (two) times daily.   Calcium Carb-Cholecalciferol (CALCIUM 600 + D PO) Take 1 capsule by mouth 2 (two) times daily.   Cholecalciferol (VITAMIN D3) 50 MCG (2000 UT) CAPS Take 1 capsule by mouth daily.   clopidogrel (PLAVIX) 75 MG tablet TAKE 1 TABLET BY MOUTH EVERY DAY   cyanocobalamin (VITAMIN B12) 1000 MCG/ML injection INJECT ONCE A MONTH   fluticasone (FLONASE) 50 MCG/ACT nasal spray Place 2 sprays into both nostrils daily.   hydrochlorothiazide  (HYDRODIURIL) 12.5 MG tablet Take 1 tablet (12.5 mg total) by mouth daily as needed (May take up to two times a week as needed for elevated blood pressure). (Patient taking differently: Take 12.5 mg by mouth 2 (two) times a week. Gollan)   levothyroxine (SYNTHROID) 50 MCG tablet TAKE 1 TABLET BY MOUTH EVERY DAY   magnesium oxide (MAG-OX) 400 (240 Mg) MG tablet Take 400 mg by mouth every other day.   Multiple Vitamins-Minerals (OCUVITE ADULT 50+ PO) Take 1 tablet by mouth daily.   simvastatin (ZOCOR) 20 MG tablet TAKE 1 TABLET BY MOUTH EVERY DAY   valsartan (DIOVAN) 80 MG tablet Take 1 tablet (80 mg total) by mouth 2 (two) times daily.   vitamin C (ASCORBIC ACID) 500 MG tablet Take 500 mg by mouth daily.       05/12/2023    1:19 PM 04/27/2023    4:26 PM 03/24/2023    1:35 PM 01/19/2023    3:11 PM  GAD 7 : Generalized Anxiety Score  Nervous, Anxious, on Edge 0 0 0 0  Control/stop worrying 0 0 0 0  Worry too much - different things 0 0 0 0  Trouble relaxing 0 0 0 0  Restless 0 0 0 0  Easily annoyed or irritable 0 0 0 0  Afraid - awful might happen 0 0 0 0  Total GAD 7 Score 0 0 0 0  Anxiety Difficulty Not difficult at all Not difficult at all Not difficult at all Not difficult at all       05/12/2023    1:19 PM 04/27/2023    4:26 PM 04/01/2023   10:15 AM  Depression screen PHQ 2/9  Decreased Interest 0 0 0  Down, Depressed, Hopeless 0 0 0  PHQ - 2 Score 0 0 0  Altered sleeping 0 0 0  Tired, decreased energy 0 0 0  Change in appetite 0 0 0  Feeling bad or failure about yourself  0 0 0  Trouble concentrating 0 0 0  Moving slowly or fidgety/restless 0 0 0  Suicidal thoughts 0 0 0  PHQ-9 Score 0 0 0  Difficult doing work/chores Not difficult at all Not difficult at all Not difficult at all    BP Readings from Last 3 Encounters:  05/12/23 134/60  04/10/23 138/60  03/24/23 138/78    Physical Exam Vitals and nursing note reviewed. Exam conducted with a chaperone present.   Constitutional:      General: She is not in acute distress.    Appearance: She is not diaphoretic.  HENT:     Head: Normocephalic and atraumatic.     Right Ear: Tympanic membrane and external ear normal.     Left Ear: Tympanic membrane and external ear normal.     Nose: Nose normal.     Mouth/Throat:     Mouth:  Mucous membranes are moist.  Eyes:     General:        Right eye: No discharge.        Left eye: No discharge.     Conjunctiva/sclera: Conjunctivae normal.     Pupils: Pupils are equal, round, and reactive to light.  Neck:     Thyroid: No thyromegaly.     Vascular: No JVD.  Cardiovascular:     Rate and Rhythm: Normal rate and regular rhythm.     Heart sounds: Normal heart sounds. No murmur heard.    No friction rub. No gallop.  Pulmonary:     Effort: Pulmonary effort is normal.     Breath sounds: Normal breath sounds. No wheezing, rhonchi or rales.  Abdominal:     General: Bowel sounds are normal.     Palpations: Abdomen is soft. There is no mass.     Tenderness: There is no abdominal tenderness.  Musculoskeletal:        General: Normal range of motion.     Cervical back: Normal range of motion and neck supple.  Lymphadenopathy:     Cervical: No cervical adenopathy.  Skin:    General: Skin is warm and dry.  Neurological:     Mental Status: She is alert.     Deep Tendon Reflexes: Reflexes are normal and symmetric.     Wt Readings from Last 3 Encounters:  05/12/23 139 lb (63 kg)  04/27/23 139 lb (63 kg)  04/10/23 138 lb 6 oz (62.8 kg)    BP 134/60   Pulse 69   Ht 5\' 2"  (1.575 m)   Wt 139 lb (63 kg)   SpO2 98%   BMI 25.42 kg/m   Assessment and Plan:  1. Essential hypertension Chronic.  Controlled.  Stable.  Blood pressure today is 134/60.  Asymptomatic.  Tolerating medication well.  Patient has excellent results on the switching of the ARB to valsartan 80 mg.  We will continue valsartan 80 mg twice a day a day and amlodipine 5 mg twice a day -  amLODipine (NORVASC) 5 MG tablet; Take 1 tablet (5 mg total) by mouth 2 (two) times daily.  Dispense: 180 tablet; Refill: 1 - valsartan (DIOVAN) 80 MG tablet; Take 1 tablet (80 mg total) by mouth 2 (two) times daily.  Dispense: 60 tablet; Refill: 1   Elizabeth Sauer, MD

## 2023-06-23 ENCOUNTER — Ambulatory Visit (INDEPENDENT_AMBULATORY_CARE_PROVIDER_SITE_OTHER): Payer: Medicare HMO

## 2023-06-23 DIAGNOSIS — I495 Sick sinus syndrome: Secondary | ICD-10-CM

## 2023-06-25 ENCOUNTER — Encounter: Payer: Self-pay | Admitting: Family Medicine

## 2023-06-25 ENCOUNTER — Ambulatory Visit (INDEPENDENT_AMBULATORY_CARE_PROVIDER_SITE_OTHER): Payer: Medicare HMO | Admitting: Family Medicine

## 2023-06-25 VITALS — BP 132/68 | HR 102 | Resp 16 | Ht 62.0 in | Wt 140.4 lb

## 2023-06-25 DIAGNOSIS — I10 Essential (primary) hypertension: Secondary | ICD-10-CM

## 2023-06-25 DIAGNOSIS — N1832 Chronic kidney disease, stage 3b: Secondary | ICD-10-CM | POA: Diagnosis not present

## 2023-06-25 LAB — CUP PACEART REMOTE DEVICE CHECK
Battery Remaining Longevity: 139 mo
Battery Voltage: 3.17 V
Brady Statistic AP VP Percent: 93.34 %
Brady Statistic AP VS Percent: 0.01 %
Brady Statistic AS VP Percent: 6.28 %
Brady Statistic AS VS Percent: 0.37 %
Brady Statistic RA Percent Paced: 93.62 %
Brady Statistic RV Percent Paced: 99.62 %
Date Time Interrogation Session: 20241112055938
Implantable Lead Connection Status: 753985
Implantable Lead Connection Status: 753985
Implantable Lead Implant Date: 20240807
Implantable Lead Implant Date: 20240807
Implantable Lead Location: 753859
Implantable Lead Location: 753860
Implantable Lead Model: 3830
Implantable Lead Model: 5076
Implantable Pulse Generator Implant Date: 20240807
Lead Channel Impedance Value: 342 Ohm
Lead Channel Impedance Value: 342 Ohm
Lead Channel Impedance Value: 418 Ohm
Lead Channel Impedance Value: 456 Ohm
Lead Channel Pacing Threshold Amplitude: 0.5 V
Lead Channel Pacing Threshold Amplitude: 0.75 V
Lead Channel Pacing Threshold Pulse Width: 0.4 ms
Lead Channel Pacing Threshold Pulse Width: 0.4 ms
Lead Channel Sensing Intrinsic Amplitude: 1.875 mV
Lead Channel Sensing Intrinsic Amplitude: 1.875 mV
Lead Channel Sensing Intrinsic Amplitude: 8.125 mV
Lead Channel Sensing Intrinsic Amplitude: 8.125 mV
Lead Channel Setting Pacing Amplitude: 1.5 V
Lead Channel Setting Pacing Amplitude: 2 V
Lead Channel Setting Pacing Pulse Width: 0.4 ms
Lead Channel Setting Sensing Sensitivity: 0.9 mV
Zone Setting Status: 755011
Zone Setting Status: 755011

## 2023-06-25 MED ORDER — AMLODIPINE BESYLATE 5 MG PO TABS
5.0000 mg | ORAL_TABLET | Freq: Two times a day (BID) | ORAL | 1 refills | Status: DC
Start: 2023-06-25 — End: 2023-12-24

## 2023-06-25 MED ORDER — VALSARTAN 80 MG PO TABS
80.0000 mg | ORAL_TABLET | Freq: Two times a day (BID) | ORAL | 1 refills | Status: DC
Start: 2023-06-25 — End: 2023-12-16

## 2023-06-25 NOTE — Progress Notes (Signed)
Date:  06/25/2023   Name:  Carly Marshall   DOB:  06-17-1936   MRN:  [id number]   Chief Complaint: Follow-up  Hypertension This is a chronic problem. The current episode started more than 1 year ago. The problem has been gradually improving since onset. The problem is uncontrolled. Pertinent negatives include no anxiety, blurred vision, chest pain, headaches, malaise/fatigue, neck pain, orthopnea, palpitations, peripheral edema, PND, shortness of breath or sweats. There are no known risk factors for coronary artery disease. Past treatments include angiotensin blockers and calcium channel blockers. The current treatment provides moderate improvement. There are no compliance problems.  There is no history of angina, kidney disease, CAD/MI, CVA, heart failure, left ventricular hypertrophy, PVD or retinopathy. tia/many years ago.    Lab Results  Component Value Date   NA 140 04/27/2023   K 3.9 04/27/2023   CO2 22 04/27/2023   GLUCOSE 102 (H) 04/27/2023   BUN 29 (H) 04/27/2023   CREATININE 1.42 (H) 04/27/2023   CALCIUM 10.0 04/27/2023   EGFR 36 (L) 04/27/2023   GFRNONAA 34 (L) 03/19/2023   Lab Results  Component Value Date   CHOL 144 12/22/2022   HDL 77 12/22/2022   LDLCALC 54 12/22/2022   TRIG 65 12/22/2022   CHOLHDL 2.6 02/08/2019   Lab Results  Component Value Date   TSH 2.448 03/16/2023   Lab Results  Component Value Date   HGBA1C 5.7 (H) 12/22/2022   Lab Results  Component Value Date   WBC 5.8 03/17/2023   HGB 11.4 (L) 03/17/2023   HCT 35.8 (L) 03/17/2023   MCV 93.7 03/17/2023   PLT 244 03/17/2023   Lab Results  Component Value Date   ALT 9 04/21/2022   AST 21 04/21/2022   ALKPHOS 127 (H) 04/21/2022   BILITOT 0.6 04/21/2022   No results found for: "25OHVITD2", "25OHVITD3", "VD25OH"   Review of Systems  Constitutional:  Negative for malaise/fatigue.  Eyes:  Negative for blurred vision and visual disturbance.  Respiratory:  Negative for cough, choking,  chest tightness, shortness of breath and wheezing.   Cardiovascular:  Negative for chest pain, palpitations, orthopnea and PND.  Gastrointestinal:  Negative for abdominal pain and anal bleeding.  Musculoskeletal:  Negative for neck pain.  Neurological:  Negative for headaches.    Patient Active Problem List   Diagnosis Date Noted   Sinus node dysfunction (HCC) 03/17/2023   Symptomatic bradycardia 03/16/2023   CKD stage 3b, GFR 30-44 ml/min (HCC) 03/16/2023   Hypermagnesemia 03/16/2023   AKI (acute kidney injury) (HCC) 03/16/2023   Edema of lower extremity 08/30/2020   Anemia in chronic kidney disease 08/30/2020   Chronic venous insufficiency 05/11/2020   Aphasia 02/07/2019   Chronic kidney disease, stage 4 (severe) (HCC) 02/07/2019   B12 deficiency 04/28/2016   Essential hypertension 03/27/2015   Hypothyroidism 03/27/2015   Hyperlipidemia 03/27/2015   Allergic rhinitis due to pollen 03/27/2015    No Known Allergies  Past Surgical History:  Procedure Laterality Date   COLONOSCOPY  2012   normal- Dr Henrene Hawking   ECTOPIC PREGNANCY SURGERY     PACEMAKER IMPLANT N/A 03/18/2023   Procedure: PACEMAKER IMPLANT;  Surgeon: Maurice Small, MD;  Location: [facility];  Service: Cardiovascular;  Laterality: N/A;    Social History   Tobacco Use   Smoking status: Never    Passive exposure: Never   Smokeless tobacco: Never   Tobacco comments:    smoking cessation materials not required  Vaping Use  Vaping status: Never Used  Substance Use Topics   Alcohol use: No    Alcohol/week: 0.0 standard drinks of alcohol   Drug use: No     Medication list has been reviewed and updated.  Current Meds  Medication Sig   amLODipine (NORVASC) 5 MG tablet Take 1 tablet (5 mg total) by mouth 2 (two) times daily.   Calcium Carb-Cholecalciferol (CALCIUM 600 + D PO) Take 1 capsule by mouth 2 (two) times daily.   Cholecalciferol (VITAMIN D3) 50 MCG (2000 UT) CAPS Take 1 capsule by mouth  daily.   clopidogrel (PLAVIX) 75 MG tablet TAKE 1 TABLET BY MOUTH EVERY DAY   cyanocobalamin (VITAMIN B12) 1000 MCG/ML injection INJECT ONCE A MONTH   fluticasone (FLONASE) 50 MCG/ACT nasal spray Place 2 sprays into both nostrils daily.   hydrochlorothiazide (HYDRODIURIL) 12.5 MG tablet Take 1 tablet (12.5 mg total) by mouth daily as needed (May take up to two times a week as needed for elevated blood pressure). (Patient taking differently: Take 12.5 mg by mouth 2 (two) times a week. Gollan)   levothyroxine (SYNTHROID) 50 MCG tablet TAKE 1 TABLET BY MOUTH EVERY DAY   magnesium oxide (MAG-OX) 400 (240 Mg) MG tablet Take 400 mg by mouth every other day.   Multiple Vitamins-Minerals (OCUVITE ADULT 50+ PO) Take 1 tablet by mouth daily.   simvastatin (ZOCOR) 20 MG tablet TAKE 1 TABLET BY MOUTH EVERY DAY   valsartan (DIOVAN) 80 MG tablet Take 1 tablet (80 mg total) by mouth 2 (two) times daily.   vitamin C (ASCORBIC ACID) 500 MG tablet Take 500 mg by mouth daily.       05/12/2023    1:19 PM 04/27/2023    4:26 PM 03/24/2023    1:35 PM 01/19/2023    3:11 PM  GAD 7 : Generalized Anxiety Score  Nervous, Anxious, on Edge 0 0 0 0  Control/stop worrying 0 0 0 0  Worry too much - different things 0 0 0 0  Trouble relaxing 0 0 0 0  Restless 0 0 0 0  Easily annoyed or irritable 0 0 0 0  Afraid - awful might happen 0 0 0 0  Total GAD 7 Score 0 0 0 0  Anxiety Difficulty Not difficult at all Not difficult at all Not difficult at all Not difficult at all       05/12/2023    1:19 PM 04/27/2023    4:26 PM 04/01/2023   10:15 AM  Depression screen PHQ 2/9  Decreased Interest 0 0 0  Down, Depressed, Hopeless 0 0 0  PHQ - 2 Score 0 0 0  Altered sleeping 0 0 0  Tired, decreased energy 0 0 0  Change in appetite 0 0 0  Feeling bad or failure about yourself  0 0 0  Trouble concentrating 0 0 0  Moving slowly or fidgety/restless 0 0 0  Suicidal thoughts 0 0 0  PHQ-9 Score 0 0 0  Difficult doing  work/chores Not difficult at all Not difficult at all Not difficult at all    BP Readings from Last 3 Encounters:  06/25/23 132/68  05/12/23 134/60  04/10/23 138/60    Physical Exam Vitals and nursing note reviewed. Exam conducted with a chaperone present.  Constitutional:      General: She is not in acute distress.    Appearance: She is not diaphoretic.  HENT:     Head: Normocephalic and atraumatic.     Right Ear: Tympanic  membrane and external ear normal.     Left Ear: External ear normal.     Nose: Nose normal.     Mouth/Throat:     Mouth: Mucous membranes are moist.  Eyes:     General:        Right eye: No discharge.        Left eye: No discharge.     Conjunctiva/sclera: Conjunctivae normal.     Pupils: Pupils are equal, round, and reactive to light.  Neck:     Thyroid: No thyromegaly.     Vascular: No JVD.  Cardiovascular:     Rate and Rhythm: Normal rate and regular rhythm.     Heart sounds: Normal heart sounds. No murmur heard.    No friction rub. No gallop.  Pulmonary:     Effort: Pulmonary effort is normal.     Breath sounds: Normal breath sounds. No wheezing, rhonchi or rales.  Chest:     Chest wall: No tenderness.  Abdominal:     General: Bowel sounds are normal.     Palpations: Abdomen is soft. There is no mass.     Tenderness: There is no abdominal tenderness. There is no guarding.  Musculoskeletal:        General: Normal range of motion.     Cervical back: Normal range of motion and neck supple.  Lymphadenopathy:     Cervical: No cervical adenopathy.  Skin:    General: Skin is warm and dry.  Neurological:     Mental Status: She is alert.     Deep Tendon Reflexes: Reflexes are normal and symmetric.     Wt Readings from Last 3 Encounters:  06/25/23 140 lb 6.4 oz (63.7 kg)  05/12/23 139 lb (63 kg)  04/27/23 139 lb (63 kg)    BP 132/68   Pulse (!) 102   Resp 16   Ht 5\' 2"  (1.575 m)   Wt 140 lb 6.4 oz (63.7 kg)   SpO2 99%   BMI 25.68 kg/m    Assessment and Plan: 1. Essential hypertension Chronic.  Controlled.  Stable.  Blood pressure today 132/68.  Continue amlodipine 5 mg twice a day and valsartan 80 mg twice a day.  Asymptomatic.  Tolerating medications well.  Will check renal function panel for electrolytes and GFR. - amLODipine (NORVASC) 5 MG tablet; Take 1 tablet (5 mg total) by mouth 2 (two) times daily.  Dispense: 180 tablet; Refill: 1 - valsartan (DIOVAN) 80 MG tablet; Take 1 tablet (80 mg total) by mouth 2 (two) times daily.  Dispense: 180 tablet; Refill: 1  2. CKD stage 3b, GFR 30-44 ml/min (HCC) Chronic.  Controlled.  Stable.  Patient does have a history of CKD and last creatinine was measured at 1.4 to with a GFR of 36.  Patient is followed by nephrology and we will repeat her renal function concerns to see if this needs to be pushed up. - Renal Function Panel     Elizabeth Sauer, MD

## 2023-06-26 ENCOUNTER — Ambulatory Visit: Payer: Medicare HMO | Attending: Cardiovascular Disease | Admitting: Cardiovascular Disease

## 2023-06-26 ENCOUNTER — Encounter: Payer: Self-pay | Admitting: Cardiovascular Disease

## 2023-06-26 VITALS — BP 164/92 | HR 62 | Ht 62.0 in | Wt 140.6 lb

## 2023-06-26 DIAGNOSIS — I495 Sick sinus syndrome: Secondary | ICD-10-CM | POA: Diagnosis not present

## 2023-06-26 LAB — CUP PACEART INCLINIC DEVICE CHECK
Date Time Interrogation Session: 20241115155124
Implantable Lead Connection Status: 753985
Implantable Lead Connection Status: 753985
Implantable Lead Implant Date: 20240807
Implantable Lead Implant Date: 20240807
Implantable Lead Location: 753859
Implantable Lead Location: 753860
Implantable Lead Model: 3830
Implantable Lead Model: 5076
Implantable Pulse Generator Implant Date: 20240807

## 2023-06-26 LAB — RENAL FUNCTION PANEL
Albumin: 4.6 g/dL (ref 3.7–4.7)
BUN/Creatinine Ratio: 21 (ref 12–28)
BUN: 32 mg/dL — ABNORMAL HIGH (ref 8–27)
CO2: 24 mmol/L (ref 20–29)
Calcium: 10 mg/dL (ref 8.7–10.3)
Chloride: 100 mmol/L (ref 96–106)
Creatinine, Ser: 1.5 mg/dL — ABNORMAL HIGH (ref 0.57–1.00)
Glucose: 92 mg/dL (ref 70–99)
Phosphorus: 3.5 mg/dL (ref 3.0–4.3)
Potassium: 4 mmol/L (ref 3.5–5.2)
Sodium: 141 mmol/L (ref 134–144)
eGFR: 34 mL/min/{1.73_m2} — ABNORMAL LOW (ref >59)

## 2023-06-26 NOTE — Patient Instructions (Signed)
Medication Instructions:  Your physician recommends that you continue on your current medications as directed. Please refer to the Current Medication list given to you today. *If you need a refill on your cardiac medications before your next appointment, please call your pharmacy*   Follow-Up: At [facility], you and your health needs are our priority.  As part of our continuing mission to provide you with exceptional heart care, we have created designated Provider Care Teams.  These Care Teams include your primary Cardiologist (physician) and Advanced Practice Providers (APPs -  Physician Assistants and Nurse Practitioners) who all work together to provide you with the care you need, when you need it.  We recommend signing up for the patient portal called "MyChart".  Sign up information is provided on this After Visit Summary.  MyChart is used to connect with patients for Virtual Visits (Telemedicine).  Patients are able to view lab/test results, encounter notes, upcoming appointments, etc.  Non-urgent messages can be sent to your provider as well.   To learn more about what you can do with MyChart, go to [email or web address].    Your next appointment:   6 month(s)  Provider:   You will see one of the following Advanced Practice Providers on your designated Care Team:   Francis Dowse, Charlott Holler [address] [place], [place] Sherie Don, NP Canary Brim, NP

## 2023-06-26 NOTE — Progress Notes (Signed)
  Electrophysiology Office Note:    Date:  06/26/2023   ID:  Carly Marshall, DOB [birth year], MRN [id number]  PCP:  Duanne Limerick, MD   [facility] Providers Cardiologist:  None     Referring MD: Duanne Limerick, MD   History of Present Illness:    Carly Marshall is a 87 y.o. female with a medical history significant for sinus bradycardia and AV block with a Medtronic dual-chamber pacemaker single kidney, hypertension hyperlipidemia, history of TIA, who presents for electrophysiology follow-up.     She was to [facility] in August 2024 with symptomatic bradycardia due to sinus node dysfunction and Mobitz 2 AV block.  She underwent placement of a Medtronic dual-chamber pacemaker.  Since pacemaker placement she has been doing very well her energy levels have improved.     Today, she reports that she is doing well and is at baseline. she has no device related complaints -- no new tenderness, drainage, redness.   EKGs/Labs/Other Studies Reviewed Today:     Echocardiogram:  TTE March 18, 2023 EF 60 to 65%.  Left atrium moderately dilated   EKG:   EKG Interpretation Date/Time:  Friday June 26 2023 14:38:43 EST Ventricular Rate:  62 PR Interval:  212 QRS Duration:  124 QT Interval:  428 QTC Calculation: 434 R Axis:   0  Text Interpretation: AV dual-paced rhythm with prolonged AV conduction with Premature supraventricular complexes When compared with ECG of 10-Apr-2023 15:41, Premature supraventricular complexes are now Present Vent. rate has increased BY   2 BPM Confirmed by York Pellant [phone]) on 06/26/2023 3:15:28 PM     Physical Exam:    VS:  BP (!) 164/92   Pulse 62   Ht 5\' 2"  (1.575 m)   Wt 140 lb 9.6 oz (63.8 kg)   SpO2 94%   BMI 25.72 kg/m     Wt Readings from Last 3 Encounters:  06/26/23 140 lb 9.6 oz (63.8 kg)  06/25/23 140 lb 6.4 oz (63.7 kg)  05/12/23 139 lb (63 kg)     GEN: Well nourished, well developed in no acute  distress CARDIAC: RRR, no murmurs, rubs, gallops The device site is normal -- no tenderness, edema, drainage, redness, threatened erosion.  RESPIRATORY:  Normal work of breathing MUSCULOSKELETAL: no edema    ASSESSMENT & PLAN:     Symptomatic bradycardia due to sinus bradycardia Mobitz II block Medtronic dual-chamber pacemaker in place functioning normally I reviewed today's device interrogation.  See Paceart for details She is pacemaker dependent today  History of TIA Monitoring for atrial fibrillation with device, no AF detected date  Solitary kidney     Signed, Maurice Small, MD  06/26/2023 3:24 PM    [facility]

## 2023-07-07 ENCOUNTER — Other Ambulatory Visit: Payer: Self-pay | Admitting: Family Medicine

## 2023-07-07 DIAGNOSIS — I10 Essential (primary) hypertension: Secondary | ICD-10-CM

## 2023-07-07 NOTE — Telephone Encounter (Signed)
Requested Prescriptions  Pending Prescriptions Disp Refills   losartan (COZAAR) 50 MG tablet [Pharmacy Med Name: LOSARTAN POTASSIUM 50 MG TAB] 90 tablet 1    Sig: TAKE 1 TABLET BY MOUTH EVERY DAY     Cardiovascular:  Angiotensin Receptor Blockers Failed - 07/07/2023 10:22 AM      Failed - Cr in normal range and within 180 days    Creatinine, Ser  Date Value Ref Range Status  06/25/2023 1.50 (H) 0.57 - 1.00 mg/dL Final         Failed - Last BP in normal range    BP Readings from Last 1 Encounters:  06/26/23 (!) 164/92         Passed - K in normal range and within 180 days    Potassium  Date Value Ref Range Status  06/25/2023 4.0 3.5 - 5.2 mmol/L Final         Passed - Patient is not pregnant      Passed - Valid encounter within last 6 months    Recent Outpatient Visits           1 week ago Essential hypertension   [facility], [facility]   1 month ago Essential hypertension   [place] [facility], MD   2 months ago Essential hypertension   [place] [facility], MD   3 months ago Symptomatic bradycardia   [facility], MD   5 months ago Dyspnea on exertion   [facility], MD       Future Appointments             In 5 months Duanne Limerick, MD [facility]

## 2023-07-15 DIAGNOSIS — N2581 Secondary hyperparathyroidism of renal origin: Secondary | ICD-10-CM | POA: Diagnosis not present

## 2023-07-15 DIAGNOSIS — I10 Essential (primary) hypertension: Secondary | ICD-10-CM | POA: Diagnosis not present

## 2023-07-15 DIAGNOSIS — R600 Localized edema: Secondary | ICD-10-CM | POA: Diagnosis not present

## 2023-07-15 DIAGNOSIS — I129 Hypertensive chronic kidney disease with stage 1 through stage 4 chronic kidney disease, or unspecified chronic kidney disease: Secondary | ICD-10-CM | POA: Diagnosis not present

## 2023-07-15 DIAGNOSIS — D631 Anemia in chronic kidney disease: Secondary | ICD-10-CM | POA: Diagnosis not present

## 2023-07-15 DIAGNOSIS — N1832 Chronic kidney disease, stage 3b: Secondary | ICD-10-CM | POA: Diagnosis not present

## 2023-07-21 NOTE — Progress Notes (Signed)
Remote pacemaker transmission.   

## 2023-07-22 DIAGNOSIS — I129 Hypertensive chronic kidney disease with stage 1 through stage 4 chronic kidney disease, or unspecified chronic kidney disease: Secondary | ICD-10-CM | POA: Diagnosis not present

## 2023-07-22 DIAGNOSIS — D631 Anemia in chronic kidney disease: Secondary | ICD-10-CM | POA: Diagnosis not present

## 2023-07-22 DIAGNOSIS — I10 Essential (primary) hypertension: Secondary | ICD-10-CM | POA: Diagnosis not present

## 2023-07-22 DIAGNOSIS — R809 Proteinuria, unspecified: Secondary | ICD-10-CM | POA: Diagnosis not present

## 2023-07-22 DIAGNOSIS — N1832 Chronic kidney disease, stage 3b: Secondary | ICD-10-CM | POA: Diagnosis not present

## 2023-07-22 DIAGNOSIS — N2581 Secondary hyperparathyroidism of renal origin: Secondary | ICD-10-CM | POA: Diagnosis not present

## 2023-08-19 ENCOUNTER — Other Ambulatory Visit: Payer: Self-pay | Admitting: Family Medicine

## 2023-08-19 DIAGNOSIS — E538 Deficiency of other specified B group vitamins: Secondary | ICD-10-CM

## 2023-08-20 ENCOUNTER — Other Ambulatory Visit: Payer: Self-pay | Admitting: Family Medicine

## 2023-08-20 DIAGNOSIS — I679 Cerebrovascular disease, unspecified: Secondary | ICD-10-CM

## 2023-09-07 ENCOUNTER — Telehealth: Payer: Self-pay | Admitting: Family Medicine

## 2023-09-07 NOTE — Telephone Encounter (Signed)
Copied from CRM [id number]. Topic: General - Inquiry >> Sep 07, 2023  9:03 AM Haroldine Laws wrote: Reason for CRM: pt is calling to say the Levothyroxine that she takes has been recalled.  She want to know what should she do.  [phone]

## 2023-09-09 DIAGNOSIS — M5451 Vertebrogenic low back pain: Secondary | ICD-10-CM | POA: Diagnosis not present

## 2023-09-09 DIAGNOSIS — M5137 Other intervertebral disc degeneration, lumbosacral region with discogenic back pain only: Secondary | ICD-10-CM | POA: Diagnosis not present

## 2023-09-09 DIAGNOSIS — M9903 Segmental and somatic dysfunction of lumbar region: Secondary | ICD-10-CM | POA: Diagnosis not present

## 2023-09-09 DIAGNOSIS — M7918 Myalgia, other site: Secondary | ICD-10-CM | POA: Diagnosis not present

## 2023-09-09 DIAGNOSIS — M9904 Segmental and somatic dysfunction of sacral region: Secondary | ICD-10-CM | POA: Diagnosis not present

## 2023-09-09 DIAGNOSIS — M5136 Other intervertebral disc degeneration, lumbar region with discogenic back pain only: Secondary | ICD-10-CM | POA: Diagnosis not present

## 2023-09-10 ENCOUNTER — Other Ambulatory Visit: Payer: Self-pay

## 2023-09-10 DIAGNOSIS — E039 Hypothyroidism, unspecified: Secondary | ICD-10-CM

## 2023-09-10 MED ORDER — LEVOTHYROXINE SODIUM 50 MCG PO TABS: 50.0000 ug | ORAL_TABLET | Freq: Every day | ORAL | 0 refills | Status: DC

## 2023-09-10 NOTE — Telephone Encounter (Signed)
Sent in refill to pharmacy for levothyroxine for patient to get new supply.

## 2023-09-10 NOTE — Telephone Encounter (Signed)
Pt called back for status update on request, requesting a call back with feedback today. Please advise

## 2023-09-11 ENCOUNTER — Ambulatory Visit: Payer: Self-pay

## 2023-09-11 NOTE — Telephone Encounter (Signed)
Pt called in about , recall on levothyroxine. Med was sent again to pharmacy, and she wants to know if its ok to take it   Chief Complaint: Pt. States her pharmacy said her levothyroxine is not in the recalled group. Symptoms: n/a Frequency: n/a Pertinent Negatives: Patient denies  Disposition: [] ED /[] Urgent Care (no appt availability in office) / [] Appointment(In office/virtual)/ []  [facility]/ [x] Home Care/ [] Refused Recommended Disposition /[] [facility]/ []  Follow-up with PCP Additional Notes:   Reason for Disposition  Caller has medicine question only, adult not sick, AND triager answers question  Answer Assessment - Initial Assessment Questions 1. NAME of MEDICINE: "What medicine(s) are you calling about?"     Levothyroxine 2. QUESTION: "What is your question?" (e.g., double dose of medicine, side effect)     Concerned about recalled medicine 3. PRESCRIBER: "Who prescribed the medicine?" Reason: if prescribed by specialist, call should be referred to that group.     [facility] 4. SYMPTOMS: "Do you have any symptoms?" If Yes, ask: "What symptoms are you having?"  "How bad are the symptoms (e.g., mild, moderate, severe)     N/a 5. PREGNANCY:  "Is there any chance that you are pregnant?" "When was your last menstrual period?"     No  Protocols used: Medication Question Call-A-AH

## 2023-09-14 DIAGNOSIS — M9903 Segmental and somatic dysfunction of lumbar region: Secondary | ICD-10-CM | POA: Diagnosis not present

## 2023-09-14 DIAGNOSIS — M5136 Other intervertebral disc degeneration, lumbar region with discogenic back pain only: Secondary | ICD-10-CM | POA: Diagnosis not present

## 2023-09-14 DIAGNOSIS — M5451 Vertebrogenic low back pain: Secondary | ICD-10-CM | POA: Diagnosis not present

## 2023-09-14 DIAGNOSIS — M9904 Segmental and somatic dysfunction of sacral region: Secondary | ICD-10-CM | POA: Diagnosis not present

## 2023-09-14 DIAGNOSIS — M7918 Myalgia, other site: Secondary | ICD-10-CM | POA: Diagnosis not present

## 2023-09-14 DIAGNOSIS — M5137 Other intervertebral disc degeneration, lumbosacral region with discogenic back pain only: Secondary | ICD-10-CM | POA: Diagnosis not present

## 2023-09-17 DIAGNOSIS — M9904 Segmental and somatic dysfunction of sacral region: Secondary | ICD-10-CM | POA: Diagnosis not present

## 2023-09-17 DIAGNOSIS — M7918 Myalgia, other site: Secondary | ICD-10-CM | POA: Diagnosis not present

## 2023-09-17 DIAGNOSIS — M5136 Other intervertebral disc degeneration, lumbar region with discogenic back pain only: Secondary | ICD-10-CM | POA: Diagnosis not present

## 2023-09-17 DIAGNOSIS — M9903 Segmental and somatic dysfunction of lumbar region: Secondary | ICD-10-CM | POA: Diagnosis not present

## 2023-09-17 DIAGNOSIS — M5137 Other intervertebral disc degeneration, lumbosacral region with discogenic back pain only: Secondary | ICD-10-CM | POA: Diagnosis not present

## 2023-09-17 DIAGNOSIS — M5451 Vertebrogenic low back pain: Secondary | ICD-10-CM | POA: Diagnosis not present

## 2023-09-21 DIAGNOSIS — M5136 Other intervertebral disc degeneration, lumbar region with discogenic back pain only: Secondary | ICD-10-CM | POA: Diagnosis not present

## 2023-09-21 DIAGNOSIS — M9903 Segmental and somatic dysfunction of lumbar region: Secondary | ICD-10-CM | POA: Diagnosis not present

## 2023-09-21 DIAGNOSIS — M5451 Vertebrogenic low back pain: Secondary | ICD-10-CM | POA: Diagnosis not present

## 2023-09-21 DIAGNOSIS — M7918 Myalgia, other site: Secondary | ICD-10-CM | POA: Diagnosis not present

## 2023-09-21 DIAGNOSIS — M9904 Segmental and somatic dysfunction of sacral region: Secondary | ICD-10-CM | POA: Diagnosis not present

## 2023-09-21 DIAGNOSIS — M5137 Other intervertebral disc degeneration, lumbosacral region with discogenic back pain only: Secondary | ICD-10-CM | POA: Diagnosis not present

## 2023-09-22 ENCOUNTER — Ambulatory Visit (INDEPENDENT_AMBULATORY_CARE_PROVIDER_SITE_OTHER): Payer: Medicare HMO

## 2023-09-22 DIAGNOSIS — I495 Sick sinus syndrome: Secondary | ICD-10-CM

## 2023-09-23 LAB — CUP PACEART REMOTE DEVICE CHECK
Battery Remaining Longevity: 124 mo
Battery Voltage: 3.11 V
Brady Statistic AP VP Percent: 99.45 %
Brady Statistic AP VS Percent: 0.01 %
Brady Statistic AS VP Percent: 0.11 %
Brady Statistic AS VS Percent: 0.43 %
Brady Statistic RA Percent Paced: 99.88 %
Brady Statistic RV Percent Paced: 99.56 %
Date Time Interrogation Session: 20250211070728
Implantable Lead Connection Status: 753985
Implantable Lead Connection Status: 753985
Implantable Lead Implant Date: 20240807
Implantable Lead Implant Date: 20240807
Implantable Lead Location: 753859
Implantable Lead Location: 753860
Implantable Lead Model: 3830
Implantable Lead Model: 5076
Implantable Pulse Generator Implant Date: 20240807
Lead Channel Impedance Value: 323 Ohm
Lead Channel Impedance Value: 342 Ohm
Lead Channel Impedance Value: 399 Ohm
Lead Channel Impedance Value: 418 Ohm
Lead Channel Pacing Threshold Amplitude: 0.375 V
Lead Channel Pacing Threshold Amplitude: 1.125 V
Lead Channel Pacing Threshold Pulse Width: 0.4 ms
Lead Channel Pacing Threshold Pulse Width: 0.4 ms
Lead Channel Sensing Intrinsic Amplitude: 1.75 mV
Lead Channel Sensing Intrinsic Amplitude: 1.75 mV
Lead Channel Sensing Intrinsic Amplitude: 7.875 mV
Lead Channel Sensing Intrinsic Amplitude: 7.875 mV
Lead Channel Setting Pacing Amplitude: 1.5 V
Lead Channel Setting Pacing Amplitude: 2.25 V
Lead Channel Setting Pacing Pulse Width: 0.4 ms
Lead Channel Setting Sensing Sensitivity: 0.9 mV
Zone Setting Status: 755011
Zone Setting Status: 755011

## 2023-09-24 DIAGNOSIS — M9903 Segmental and somatic dysfunction of lumbar region: Secondary | ICD-10-CM | POA: Diagnosis not present

## 2023-09-24 DIAGNOSIS — M9904 Segmental and somatic dysfunction of sacral region: Secondary | ICD-10-CM | POA: Diagnosis not present

## 2023-09-24 DIAGNOSIS — M7918 Myalgia, other site: Secondary | ICD-10-CM | POA: Diagnosis not present

## 2023-09-24 DIAGNOSIS — M5136 Other intervertebral disc degeneration, lumbar region with discogenic back pain only: Secondary | ICD-10-CM | POA: Diagnosis not present

## 2023-09-24 DIAGNOSIS — M5137 Other intervertebral disc degeneration, lumbosacral region with discogenic back pain only: Secondary | ICD-10-CM | POA: Diagnosis not present

## 2023-09-24 DIAGNOSIS — M5451 Vertebrogenic low back pain: Secondary | ICD-10-CM | POA: Diagnosis not present

## 2023-09-26 ENCOUNTER — Other Ambulatory Visit: Payer: Self-pay | Admitting: Family Medicine

## 2023-09-26 DIAGNOSIS — I10 Essential (primary) hypertension: Secondary | ICD-10-CM

## 2023-09-28 DIAGNOSIS — M9904 Segmental and somatic dysfunction of sacral region: Secondary | ICD-10-CM | POA: Diagnosis not present

## 2023-09-28 DIAGNOSIS — M5136 Other intervertebral disc degeneration, lumbar region with discogenic back pain only: Secondary | ICD-10-CM | POA: Diagnosis not present

## 2023-09-28 DIAGNOSIS — M7918 Myalgia, other site: Secondary | ICD-10-CM | POA: Diagnosis not present

## 2023-09-28 DIAGNOSIS — M5451 Vertebrogenic low back pain: Secondary | ICD-10-CM | POA: Diagnosis not present

## 2023-09-28 DIAGNOSIS — M9903 Segmental and somatic dysfunction of lumbar region: Secondary | ICD-10-CM | POA: Diagnosis not present

## 2023-09-28 DIAGNOSIS — M5137 Other intervertebral disc degeneration, lumbosacral region with discogenic back pain only: Secondary | ICD-10-CM | POA: Diagnosis not present

## 2023-10-04 ENCOUNTER — Other Ambulatory Visit: Payer: Self-pay | Admitting: Family Medicine

## 2023-10-04 ENCOUNTER — Encounter: Payer: Self-pay | Admitting: Family Medicine

## 2023-10-04 DIAGNOSIS — I10 Essential (primary) hypertension: Secondary | ICD-10-CM

## 2023-10-05 DIAGNOSIS — M7918 Myalgia, other site: Secondary | ICD-10-CM | POA: Diagnosis not present

## 2023-10-05 DIAGNOSIS — M5136 Other intervertebral disc degeneration, lumbar region with discogenic back pain only: Secondary | ICD-10-CM | POA: Diagnosis not present

## 2023-10-05 DIAGNOSIS — M9903 Segmental and somatic dysfunction of lumbar region: Secondary | ICD-10-CM | POA: Diagnosis not present

## 2023-10-05 DIAGNOSIS — M9904 Segmental and somatic dysfunction of sacral region: Secondary | ICD-10-CM | POA: Diagnosis not present

## 2023-10-05 DIAGNOSIS — M5451 Vertebrogenic low back pain: Secondary | ICD-10-CM | POA: Diagnosis not present

## 2023-10-05 DIAGNOSIS — M5137 Other intervertebral disc degeneration, lumbosacral region with discogenic back pain only: Secondary | ICD-10-CM | POA: Diagnosis not present

## 2023-10-06 ENCOUNTER — Encounter: Payer: Self-pay | Admitting: Cardiovascular Disease

## 2023-10-08 DIAGNOSIS — M5136 Other intervertebral disc degeneration, lumbar region with discogenic back pain only: Secondary | ICD-10-CM | POA: Diagnosis not present

## 2023-10-08 DIAGNOSIS — M5137 Other intervertebral disc degeneration, lumbosacral region with discogenic back pain only: Secondary | ICD-10-CM | POA: Diagnosis not present

## 2023-10-08 DIAGNOSIS — M9903 Segmental and somatic dysfunction of lumbar region: Secondary | ICD-10-CM | POA: Diagnosis not present

## 2023-10-08 DIAGNOSIS — M9904 Segmental and somatic dysfunction of sacral region: Secondary | ICD-10-CM | POA: Diagnosis not present

## 2023-10-08 DIAGNOSIS — M7918 Myalgia, other site: Secondary | ICD-10-CM | POA: Diagnosis not present

## 2023-10-08 DIAGNOSIS — M5451 Vertebrogenic low back pain: Secondary | ICD-10-CM | POA: Diagnosis not present

## 2023-11-02 NOTE — Progress Notes (Signed)
 Remote pacemaker transmission.

## 2023-11-02 NOTE — Addendum Note (Signed)
 Addended by: Geralyn Flash D on: 11/02/2023 02:26 PM   Modules accepted: Orders

## 2023-11-13 ENCOUNTER — Other Ambulatory Visit: Payer: Self-pay | Admitting: Family Medicine

## 2023-11-13 DIAGNOSIS — E538 Deficiency of other specified B group vitamins: Secondary | ICD-10-CM

## 2023-11-13 NOTE — Telephone Encounter (Signed)
 Requested Prescriptions  Pending Prescriptions Disp Refills   cyanocobalamin (VITAMIN B12) 1000 MCG/ML injection [Pharmacy Med Name: CYANOCOBALAMIN 1,000 MCG/ML VL] 3 mL 0    Sig: INJECT ONCE A MONTH     Endocrinology:  Vitamins - Vitamin B12 Failed - 11/13/2023  4:28 PM      Failed - HCT in normal range and within 360 days    HCT  Date Value Ref Range Status  03/17/2023 35.8 (L) 36.0 - 46.0 % Final   Hematocrit  Date Value Ref Range Status  04/21/2022 37.1 34.0 - 46.6 % Final         Failed - HGB in normal range and within 360 days    Hemoglobin  Date Value Ref Range Status  03/17/2023 11.4 (L) 12.0 - 15.0 g/dL Final  04/54/0981 19.1 11.1 - 15.9 g/dL Final         Failed - B12 Level in normal range and within 360 days    Vitamin B-12  Date Value Ref Range Status  11/04/2018 479 232 - 1,245 pg/mL Final         Failed - Valid encounter within last 12 months    Recent Outpatient Visits   None     Future Appointments             In 1 month Duanne Limerick, MD [facility] at [facility]   In 2 months Gollan, Tollie Pizza, MD [facility] at [facility]

## 2023-11-17 ENCOUNTER — Other Ambulatory Visit: Payer: Self-pay | Admitting: Family Medicine

## 2023-11-17 DIAGNOSIS — I10 Essential (primary) hypertension: Secondary | ICD-10-CM | POA: Diagnosis not present

## 2023-11-17 DIAGNOSIS — N2581 Secondary hyperparathyroidism of renal origin: Secondary | ICD-10-CM | POA: Diagnosis not present

## 2023-11-17 DIAGNOSIS — I129 Hypertensive chronic kidney disease with stage 1 through stage 4 chronic kidney disease, or unspecified chronic kidney disease: Secondary | ICD-10-CM | POA: Diagnosis not present

## 2023-11-17 DIAGNOSIS — R809 Proteinuria, unspecified: Secondary | ICD-10-CM | POA: Diagnosis not present

## 2023-11-17 DIAGNOSIS — D631 Anemia in chronic kidney disease: Secondary | ICD-10-CM | POA: Diagnosis not present

## 2023-11-17 DIAGNOSIS — N1832 Chronic kidney disease, stage 3b: Secondary | ICD-10-CM | POA: Diagnosis not present

## 2023-11-17 DIAGNOSIS — I679 Cerebrovascular disease, unspecified: Secondary | ICD-10-CM

## 2023-11-24 DIAGNOSIS — N2581 Secondary hyperparathyroidism of renal origin: Secondary | ICD-10-CM | POA: Diagnosis not present

## 2023-11-24 DIAGNOSIS — R600 Localized edema: Secondary | ICD-10-CM | POA: Diagnosis not present

## 2023-11-24 DIAGNOSIS — R809 Proteinuria, unspecified: Secondary | ICD-10-CM | POA: Diagnosis not present

## 2023-11-24 DIAGNOSIS — N1832 Chronic kidney disease, stage 3b: Secondary | ICD-10-CM | POA: Diagnosis not present

## 2023-11-24 DIAGNOSIS — D631 Anemia in chronic kidney disease: Secondary | ICD-10-CM | POA: Diagnosis not present

## 2023-11-24 DIAGNOSIS — I129 Hypertensive chronic kidney disease with stage 1 through stage 4 chronic kidney disease, or unspecified chronic kidney disease: Secondary | ICD-10-CM | POA: Diagnosis not present

## 2023-11-24 DIAGNOSIS — I10 Essential (primary) hypertension: Secondary | ICD-10-CM | POA: Diagnosis not present

## 2023-12-07 DIAGNOSIS — M5136 Other intervertebral disc degeneration, lumbar region with discogenic back pain only: Secondary | ICD-10-CM | POA: Diagnosis not present

## 2023-12-07 DIAGNOSIS — M9904 Segmental and somatic dysfunction of sacral region: Secondary | ICD-10-CM | POA: Diagnosis not present

## 2023-12-07 DIAGNOSIS — M5137 Other intervertebral disc degeneration, lumbosacral region with discogenic back pain only: Secondary | ICD-10-CM | POA: Diagnosis not present

## 2023-12-07 DIAGNOSIS — M9903 Segmental and somatic dysfunction of lumbar region: Secondary | ICD-10-CM | POA: Diagnosis not present

## 2023-12-07 DIAGNOSIS — M5451 Vertebrogenic low back pain: Secondary | ICD-10-CM | POA: Diagnosis not present

## 2023-12-07 DIAGNOSIS — M7918 Myalgia, other site: Secondary | ICD-10-CM | POA: Diagnosis not present

## 2023-12-08 DIAGNOSIS — M9904 Segmental and somatic dysfunction of sacral region: Secondary | ICD-10-CM | POA: Diagnosis not present

## 2023-12-08 DIAGNOSIS — M5136 Other intervertebral disc degeneration, lumbar region with discogenic back pain only: Secondary | ICD-10-CM | POA: Diagnosis not present

## 2023-12-08 DIAGNOSIS — M5451 Vertebrogenic low back pain: Secondary | ICD-10-CM | POA: Diagnosis not present

## 2023-12-08 DIAGNOSIS — M9903 Segmental and somatic dysfunction of lumbar region: Secondary | ICD-10-CM | POA: Diagnosis not present

## 2023-12-08 DIAGNOSIS — M7918 Myalgia, other site: Secondary | ICD-10-CM | POA: Diagnosis not present

## 2023-12-08 DIAGNOSIS — M5137 Other intervertebral disc degeneration, lumbosacral region with discogenic back pain only: Secondary | ICD-10-CM | POA: Diagnosis not present

## 2023-12-09 DIAGNOSIS — M9904 Segmental and somatic dysfunction of sacral region: Secondary | ICD-10-CM | POA: Diagnosis not present

## 2023-12-09 DIAGNOSIS — M5136 Other intervertebral disc degeneration, lumbar region with discogenic back pain only: Secondary | ICD-10-CM | POA: Diagnosis not present

## 2023-12-09 DIAGNOSIS — M7918 Myalgia, other site: Secondary | ICD-10-CM | POA: Diagnosis not present

## 2023-12-09 DIAGNOSIS — M9903 Segmental and somatic dysfunction of lumbar region: Secondary | ICD-10-CM | POA: Diagnosis not present

## 2023-12-09 DIAGNOSIS — M5137 Other intervertebral disc degeneration, lumbosacral region with discogenic back pain only: Secondary | ICD-10-CM | POA: Diagnosis not present

## 2023-12-09 DIAGNOSIS — M5451 Vertebrogenic low back pain: Secondary | ICD-10-CM | POA: Diagnosis not present

## 2023-12-10 ENCOUNTER — Encounter: Payer: Self-pay | Admitting: Family Medicine

## 2023-12-10 ENCOUNTER — Ambulatory Visit
Admission: RE | Admit: 2023-12-10 | Discharge: 2023-12-10 | Disposition: A | Discharge status: Home / Self Care | Source: Ambulatory Visit | Attending: Family Medicine | Admitting: Family Medicine

## 2023-12-10 ENCOUNTER — Ambulatory Visit: Admitting: Family Medicine

## 2023-12-10 ENCOUNTER — Ambulatory Visit
Admission: RE | Admit: 2023-12-10 | Discharge: 2023-12-10 | Disposition: A | Discharge status: Home / Self Care | Attending: Family Medicine | Admitting: Family Medicine

## 2023-12-10 ENCOUNTER — Ambulatory Visit: Payer: Self-pay

## 2023-12-10 VITALS — BP 134/70 | HR 74 | Ht 62.0 in | Wt 141.1 lb

## 2023-12-10 DIAGNOSIS — S32010A Wedge compression fracture of first lumbar vertebra, initial encounter for closed fracture: Secondary | ICD-10-CM | POA: Diagnosis not present

## 2023-12-10 DIAGNOSIS — M47816 Spondylosis without myelopathy or radiculopathy, lumbar region: Secondary | ICD-10-CM | POA: Diagnosis not present

## 2023-12-10 DIAGNOSIS — M4856XA Collapsed vertebra, not elsewhere classified, lumbar region, initial encounter for fracture: Secondary | ICD-10-CM | POA: Diagnosis not present

## 2023-12-10 DIAGNOSIS — M4187 Other forms of scoliosis, lumbosacral region: Secondary | ICD-10-CM | POA: Diagnosis not present

## 2023-12-10 DIAGNOSIS — M545 Low back pain, unspecified: Secondary | ICD-10-CM

## 2023-12-10 MED ORDER — PREDNISONE 10 MG PO TABS: 10.0000 mg | ORAL_TABLET | Freq: Every day | ORAL | 0 refills | Status: DC

## 2023-12-10 MED ORDER — CYCLOBENZAPRINE HCL 10 MG PO TABS: 10.0000 mg | ORAL_TABLET | Freq: Three times a day (TID) | ORAL | 0 refills | Status: DC | PRN

## 2023-12-10 NOTE — Telephone Encounter (Signed)
 Noted   Pt has a appointment.  KP

## 2023-12-10 NOTE — Telephone Encounter (Signed)
  Chief Complaint: lower and mid back pain Symptoms: bpain 9/10, spasms Frequency: Last Thursday Pertinent Negatives: Patient denies shooting pains down leg Disposition: [] ED /[] Urgent Care (no appt availability in office) / [x] Appointment(In office/virtual)/ []  [facility]/ [] Home Care/ [] Refused Recommended Disposition /[] [facility]/ []  Follow-up with PCP Additional Notes:  Copied from CRM [id number]. Topic: Clinical - Red Word Triage >> Dec 10, 2023  8:15 AM Hamp Levine R wrote: Red Word that prompted transfer to Nurse Triage: Pain, patient has been having back pain since 12/04/23. States it has progressively gotten worse and now has been having muscle spasms that cause her to double over the pain is so bad. Reason for Disposition  [1] SEVERE back pain (e.g., excruciating, unable to do any normal activities) AND [2] not improved 2 hours after pain medicine  Answer Assessment - Initial Assessment Questions 1. ONSET: "When did the pain begin?"      Last Thursday  2. LOCATION: "Where does it hurt?" (upper, mid or lower back)     Lower back  and part of mid back  3. SEVERITY: "How bad is the pain?"  (e.g., Scale 1-10; mild, moderate, or severe)   - MILD (1-3): Doesn't interfere with normal activities.    - MODERATE (4-7): Interferes with normal activities or awakens from sleep.    - SEVERE (8-10): Excruciating pain, unable to do any normal activities.      9/10 4. PATTERN: "Is the pain constant?" (e.g., yes, no; constant, intermittent)      Always there but worse with spasms 5. RADIATION: "Does the pain shoot into your legs or somewhere else?"     no 9. NEUROLOGIC SYMPTOMS: "Do you have any weakness, numbness, or problems with bowel/bladder control?"     no 10. OTHER SYMPTOMS: "Do you have any other symptoms?" (e.g., fever, abdomen pain, burning with urination, blood in urine)       Back spasms  Protocols used: Back Pain-A-AH

## 2023-12-10 NOTE — Progress Notes (Signed)
 Date:  12/10/2023   Name:  Carly Marshall   DOB:  27-May-1936   MRN:  [id number]   Chief Complaint: Back Pain (1 week, muscle spasms, has been taking tylenol  but no help)  Back Pain This is a new problem. The current episode started in the past 7 days. The problem occurs constantly. The problem has been waxing and waning since onset. The pain is present in the lumbar spine. The quality of the pain is described as aching. The pain does not radiate. The pain is at a severity of 9/10. The pain is moderate. The symptoms are aggravated by bending and twisting. Pertinent negatives include no abdominal pain, bladder incontinence, bowel incontinence, chest pain, dysuria, fever, paresthesias, tingling or weakness.    Lab Results  Component Value Date   NA 141 06/25/2023   K 4.0 06/25/2023   CO2 24 06/25/2023   GLUCOSE 92 06/25/2023   BUN 32 (H) 06/25/2023   CREATININE 1.50 (H) 06/25/2023   CALCIUM  10.0 06/25/2023   EGFR 34 (L) 06/25/2023   GFRNONAA 34 (L) 03/19/2023   Lab Results  Component Value Date   CHOL 144 12/22/2022   HDL 77 12/22/2022   LDLCALC 54 12/22/2022   TRIG 65 12/22/2022   CHOLHDL 2.6 02/08/2019   Lab Results  Component Value Date   TSH 2.448 03/16/2023   Lab Results  Component Value Date   HGBA1C 5.7 (H) 12/22/2022   Lab Results  Component Value Date   WBC 5.8 03/17/2023   HGB 11.4 (L) 03/17/2023   HCT 35.8 (L) 03/17/2023   MCV 93.7 03/17/2023   PLT 244 03/17/2023   Lab Results  Component Value Date   ALT 9 04/21/2022   AST 21 04/21/2022   ALKPHOS 127 (H) 04/21/2022   BILITOT 0.6 04/21/2022   No results found for: "25OHVITD2", "25OHVITD3", "VD25OH"   Review of Systems  Constitutional:  Negative for fever.  Respiratory:  Negative for choking, shortness of breath and wheezing.   Cardiovascular:  Negative for chest pain, palpitations and leg swelling.  Gastrointestinal:  Negative for abdominal pain, blood in stool and bowel incontinence.   Genitourinary:  Negative for bladder incontinence, dysuria and hematuria.  Musculoskeletal:  Positive for back pain. Negative for arthralgias.  Neurological:  Negative for tingling, weakness and paresthesias.    Patient Active Problem List   Diagnosis Date Noted   Sinus node dysfunction (HCC) 03/17/2023   Symptomatic bradycardia 03/16/2023   CKD stage 3b, GFR 30-44 ml/min (HCC) 03/16/2023   Hypermagnesemia 03/16/2023   AKI (acute kidney injury) (HCC) 03/16/2023   Edema of lower extremity 08/30/2020   Anemia in chronic kidney disease 08/30/2020   Chronic venous insufficiency 05/11/2020   Aphasia 02/07/2019   Chronic kidney disease, stage 4 (severe) (HCC) 02/07/2019   B12 deficiency 04/28/2016   Essential hypertension 03/27/2015   Hypothyroidism 03/27/2015   Hyperlipidemia 03/27/2015   Allergic rhinitis due to pollen 03/27/2015    No Known Allergies  Past Surgical History:  Procedure Laterality Date   COLONOSCOPY  2012   normal- Dr Beth Brooke   ECTOPIC PREGNANCY SURGERY     PACEMAKER IMPLANT N/A 03/18/2023   Procedure: PACEMAKER IMPLANT;  Surgeon: Efraim Grange, MD;  Location: [facility];  Service: Cardiovascular;  Laterality: N/A;    Social History   Tobacco Use   Smoking status: Never    Passive exposure: Never   Smokeless tobacco: Never   Tobacco comments:    smoking cessation materials not  required  Vaping Use   Vaping status: Never Used  Substance Use Topics   Alcohol use: No    Alcohol/week: 0.0 standard drinks of alcohol   Drug use: No     Medication list has been reviewed and updated.  Current Meds  Medication Sig   amLODipine  (NORVASC ) 5 MG tablet Take 1 tablet (5 mg total) by mouth 2 (two) times daily.   Calcium  Carb-Cholecalciferol  (CALCIUM  600 + D PO) Take 1 capsule by mouth 2 (two) times daily.   Cholecalciferol  (VITAMIN D3) 50 MCG (2000 UT) CAPS Take 1 capsule by mouth daily.   clopidogrel  (PLAVIX ) 75 MG tablet TAKE 1 TABLET BY MOUTH  EVERY DAY   cyanocobalamin  (VITAMIN B12) 1000 MCG/ML injection INJECT 1ML ONCE A MONTH   fluticasone  (FLONASE ) 50 MCG/ACT nasal spray Place 2 sprays into both nostrils daily.   hydrochlorothiazide  (HYDRODIURIL ) 12.5 MG tablet TAKE 1 TABLET BY MOUTH EVERY DAY   levothyroxine  (SYNTHROID ) 50 MCG tablet Take 1 tablet (50 mcg total) by mouth daily.   magnesium oxide (MAG-OX) 400 (240 Mg) MG tablet Take 400 mg by mouth every other day.   Multiple Vitamins-Minerals (OCUVITE ADULT 50+ PO) Take 1 tablet by mouth daily.   simvastatin  (ZOCOR ) 20 MG tablet TAKE 1 TABLET BY MOUTH EVERY DAY   valsartan  (DIOVAN ) 80 MG tablet Take 1 tablet (80 mg total) by mouth 2 (two) times daily.   vitamin C (ASCORBIC ACID) 500 MG tablet Take 500 mg by mouth daily.       12/10/2023    2:47 PM 05/12/2023    1:19 PM 04/27/2023    4:26 PM 03/24/2023    1:35 PM  GAD 7 : Generalized Anxiety Score  Nervous, Anxious, on Edge 0 0 0 0  Control/stop worrying 0 0 0 0  Worry too much - different things  0 0 0  Trouble relaxing  0 0 0  Restless  0 0 0  Easily annoyed or irritable  0 0 0  Afraid - awful might happen  0 0 0  Total GAD 7 Score  0 0 0  Anxiety Difficulty  Not difficult at all Not difficult at all Not difficult at all       12/10/2023    2:47 PM 05/12/2023    1:19 PM 04/27/2023    4:26 PM  Depression screen PHQ 2/9  Decreased Interest 0 0 0  Down, Depressed, Hopeless 0 0 0  PHQ - 2 Score 0 0 0  Altered sleeping  0 0  Tired, decreased energy  0 0  Change in appetite  0 0  Feeling bad or failure about yourself   0 0  Trouble concentrating  0 0  Moving slowly or fidgety/restless  0 0  Suicidal thoughts  0 0  PHQ-9 Score  0 0  Difficult doing work/chores  Not difficult at all Not difficult at all    BP Readings from Last 3 Encounters:  12/10/23 134/70  06/26/23 (!) 164/92  06/25/23 132/68    Physical Exam Vitals and nursing note reviewed.  Constitutional:      General: She is not in acute distress.     Appearance: She is not diaphoretic.  HENT:     Head: Normocephalic and atraumatic.     Right Ear: Tympanic membrane and external ear normal.     Left Ear: Tympanic membrane and external ear normal.     Nose: Nose normal.     Mouth/Throat:     Mouth:  Mucous membranes are moist.  Eyes:     General:        Right eye: No discharge.        Left eye: No discharge.     Conjunctiva/sclera: Conjunctivae normal.     Pupils: Pupils are equal, round, and reactive to light.  Neck:     Thyroid : No thyromegaly.     Vascular: No JVD.  Cardiovascular:     Rate and Rhythm: Normal rate and regular rhythm.     Heart sounds: Normal heart sounds. No murmur heard.    No friction rub. No gallop.  Pulmonary:     Effort: Pulmonary effort is normal.     Breath sounds: Normal breath sounds. No wheezing, rhonchi or rales.  Abdominal:     General: Bowel sounds are normal.     Palpations: Abdomen is soft. There is no mass.     Tenderness: There is no abdominal tenderness. There is no guarding.  Musculoskeletal:        General: Normal range of motion.     Cervical back: Normal range of motion and neck supple.     Lumbar back: Spasms present. No deformity, signs of trauma, tenderness or bony tenderness. Normal range of motion. Negative right straight leg raise test and negative left straight leg raise test.  Lymphadenopathy:     Cervical: No cervical adenopathy.  Skin:    General: Skin is warm and dry.  Neurological:     Mental Status: She is alert.     Deep Tendon Reflexes: Reflexes are normal and symmetric.     Wt Readings from Last 3 Encounters:  12/10/23 141 lb 2 oz (64 kg)  06/26/23 140 lb 9.6 oz (63.8 kg)  06/25/23 140 lb 6.4 oz (63.7 kg)    BP 134/70   Pulse 74   Ht 5\' 2"  (1.575 m)   Wt 141 lb 2 oz (64 kg)   SpO2 96%   BMI 25.81 kg/m   Assessment and Plan: 1. Acute midline low back pain without sciatica (Primary) New onset.  Persistent.  Excruciating sudden pain with certain  movements and change in position to a 9/10.  Concerned that I am having is the intense pain and the pain is limiting range of motion and comfort level of patient.  Patient will continue over-the-counter NSAIDs and we will add cyclobenzaprine  10 mg twice a day initially may go to 3 times a day if necessary prednisone  given that she is got a history of degenerative disc disease in case there is nerve impingement.  And will obtain lumbar spine for below reason. - DG Lumbar Spine 2-3 Views  2. Closed compression fracture of body of L1 vertebra (HCC) Review of x-ray in 2012 notes compression fracture of L1.  Hopefully this is not progressed to the point and there is some instability so we will obtain x-ray of the lumbar sacral area and compare to previous readings to see if there is progression of the compression fracture or new compression fracture. - DG Lumbar Spine 2-3 Views - cyclobenzaprine  (FLEXERIL ) 10 MG tablet; Take 1 tablet (10 mg total) by mouth 3 (three) times daily as needed for muscle spasms.  Dispense: 30 tablet; Refill: 0 - predniSONE  (DELTASONE ) 10 MG tablet; Take 1 tablet (10 mg total) by mouth daily with breakfast.  Dispense: 30 tablet; Refill: 0     Alayne Allis, MD

## 2023-12-14 ENCOUNTER — Other Ambulatory Visit: Payer: Self-pay | Admitting: Family Medicine

## 2023-12-14 ENCOUNTER — Other Ambulatory Visit: Payer: Self-pay

## 2023-12-14 DIAGNOSIS — S32010A Wedge compression fracture of first lumbar vertebra, initial encounter for closed fracture: Secondary | ICD-10-CM

## 2023-12-14 MED ORDER — CALCITONIN (SALMON) 200 UNIT/ACT NA SOLN: 1.0000 | Freq: Every day | NASAL | 1 refills | Status: DC

## 2023-12-15 ENCOUNTER — Other Ambulatory Visit: Payer: Self-pay | Admitting: Family Medicine

## 2023-12-15 DIAGNOSIS — Z0100 Encounter for examination of eyes and vision without abnormal findings: Secondary | ICD-10-CM | POA: Diagnosis not present

## 2023-12-15 DIAGNOSIS — I10 Essential (primary) hypertension: Secondary | ICD-10-CM

## 2023-12-15 DIAGNOSIS — H35363 Drusen (degenerative) of macula, bilateral: Secondary | ICD-10-CM | POA: Diagnosis not present

## 2023-12-15 DIAGNOSIS — H524 Presbyopia: Secondary | ICD-10-CM | POA: Diagnosis not present

## 2023-12-16 NOTE — Telephone Encounter (Signed)
 Requested Prescriptions  Pending Prescriptions Disp Refills   valsartan  (DIOVAN ) 80 MG tablet [Pharmacy Med Name: VALSARTAN  80 MG TABLET] 180 tablet 1    Sig: TAKE 1 TABLET BY MOUTH 2 TIMES DAILY.     Cardiovascular:  Angiotensin Receptor Blockers Failed - 12/16/2023  2:13 PM      Failed - Cr in normal range and within 180 days    Creatinine, Ser  Date Value Ref Range Status  06/25/2023 1.50 (H) 0.57 - 1.00 mg/dL Final         Failed - Valid encounter within last 6 months    Recent Outpatient Visits           6 days ago Acute midline low back pain without sciatica   [facility], MD       Future Appointments             In 1 week Clarise Crooks, MD [facility] at [facility]   In 1 month Gollan, Timothy J, MD [facility] at [facility] - K in normal range and within 180 days    Potassium  Date Value Ref Range Status  06/25/2023 4.0 3.5 - 5.2 mmol/L Final         Passed - Patient is not pregnant      Passed - Last BP in normal range    BP Readings from Last 1 Encounters:  12/10/23 134/70

## 2023-12-22 ENCOUNTER — Ambulatory Visit (INDEPENDENT_AMBULATORY_CARE_PROVIDER_SITE_OTHER): Payer: Medicare HMO

## 2023-12-22 DIAGNOSIS — I495 Sick sinus syndrome: Secondary | ICD-10-CM

## 2023-12-22 LAB — CUP PACEART REMOTE DEVICE CHECK
Battery Remaining Longevity: 123 mo
Battery Voltage: 3.05 V
Brady Statistic AP VP Percent: 90.77 %
Brady Statistic AP VS Percent: 0.1 %
Brady Statistic AS VP Percent: 8.44 %
Brady Statistic AS VS Percent: 0.69 %
Brady Statistic RA Percent Paced: 91.22 %
Brady Statistic RV Percent Paced: 99.21 %
Date Time Interrogation Session: 20250512210140
Implantable Lead Connection Status: 753985
Implantable Lead Connection Status: 753985
Implantable Lead Implant Date: 20240807
Implantable Lead Implant Date: 20240807
Implantable Lead Location: 753859
Implantable Lead Location: 753860
Implantable Lead Model: 3830
Implantable Lead Model: 5076
Implantable Pulse Generator Implant Date: 20240807
Lead Channel Impedance Value: 342 Ohm
Lead Channel Impedance Value: 361 Ohm
Lead Channel Impedance Value: 418 Ohm
Lead Channel Impedance Value: 494 Ohm
Lead Channel Pacing Threshold Amplitude: 0.625 V
Lead Channel Pacing Threshold Amplitude: 1.125 V
Lead Channel Pacing Threshold Pulse Width: 0.4 ms
Lead Channel Pacing Threshold Pulse Width: 0.4 ms
Lead Channel Sensing Intrinsic Amplitude: 2.875 mV
Lead Channel Sensing Intrinsic Amplitude: 2.875 mV
Lead Channel Sensing Intrinsic Amplitude: 9.5 mV
Lead Channel Sensing Intrinsic Amplitude: 9.5 mV
Lead Channel Setting Pacing Amplitude: 1.5 V
Lead Channel Setting Pacing Amplitude: 2.25 V
Lead Channel Setting Pacing Pulse Width: 0.4 ms
Lead Channel Setting Sensing Sensitivity: 0.9 mV
Zone Setting Status: 755011
Zone Setting Status: 755011

## 2023-12-24 ENCOUNTER — Ambulatory Visit: Payer: Self-pay | Admitting: Family Medicine

## 2023-12-24 ENCOUNTER — Encounter: Payer: Self-pay | Admitting: Family Medicine

## 2023-12-24 VITALS — BP 130/70 | HR 74 | Ht 62.0 in | Wt 137.2 lb

## 2023-12-24 DIAGNOSIS — I10 Essential (primary) hypertension: Secondary | ICD-10-CM

## 2023-12-24 DIAGNOSIS — S32010A Wedge compression fracture of first lumbar vertebra, initial encounter for closed fracture: Secondary | ICD-10-CM | POA: Diagnosis not present

## 2023-12-24 DIAGNOSIS — Z992 Dependence on renal dialysis: Secondary | ICD-10-CM

## 2023-12-24 DIAGNOSIS — N186 End stage renal disease: Secondary | ICD-10-CM | POA: Diagnosis not present

## 2023-12-24 DIAGNOSIS — N2581 Secondary hyperparathyroidism of renal origin: Secondary | ICD-10-CM | POA: Diagnosis not present

## 2023-12-24 MED ORDER — AMLODIPINE BESYLATE 5 MG PO TABS: 5.0000 mg | ORAL_TABLET | Freq: Two times a day (BID) | ORAL | 1 refills | Status: DC

## 2023-12-24 MED ORDER — HYDROCHLOROTHIAZIDE 12.5 MG PO TABS: 12.5000 mg | ORAL_TABLET | Freq: Every day | ORAL | 1 refills | Status: AC

## 2023-12-24 MED ORDER — VALSARTAN 80 MG PO TABS: 80.0000 mg | ORAL_TABLET | Freq: Two times a day (BID) | ORAL | 1 refills | Status: AC

## 2023-12-24 NOTE — Progress Notes (Signed)
 Date:  12/24/2023   Name:  Carly Marshall   DOB:  1935/11/30   MRN:  [id number]   Chief Complaint: Hypertension  Hypertension This is a chronic problem. The current episode started more than 1 year ago. The problem has been gradually improving since onset. The problem is controlled. Pertinent negatives include no chest pain, headaches, neck pain, palpitations, peripheral edema, PND, shortness of breath or sweats. There are no associated agents to hypertension. Risk factors for coronary artery disease include dyslipidemia. Past treatments include diuretics, calcium  channel blockers and angiotensin blockers. The current treatment provides moderate improvement. There are no compliance problems.  There is no history of CAD/MI or CVA. tia. There is no history of chronic renal disease, a hypertension causing med or renovascular disease.  Back Pain This is a recurrent problem. The current episode started more than 1 month ago. The problem occurs intermittently. The problem has been waxing and waning (some improvement with frequency and duration) since onset. The pain is mild. The symptoms are aggravated by bending and twisting. Pertinent negatives include no bladder incontinence, bowel incontinence, chest pain, headaches, paresthesias or perianal numbness.    Lab Results  Component Value Date   NA 141 06/25/2023   K 4.0 06/25/2023   CO2 24 06/25/2023   GLUCOSE 92 06/25/2023   BUN 32 (H) 06/25/2023   CREATININE 1.50 (H) 06/25/2023   CALCIUM  10.0 06/25/2023   EGFR 34 (L) 06/25/2023   GFRNONAA 34 (L) 03/19/2023   Lab Results  Component Value Date   CHOL 144 12/22/2022   HDL 77 12/22/2022   LDLCALC 54 12/22/2022   TRIG 65 12/22/2022   CHOLHDL 2.6 02/08/2019   Lab Results  Component Value Date   TSH 2.448 03/16/2023   Lab Results  Component Value Date   HGBA1C 5.7 (H) 12/22/2022   Lab Results  Component Value Date   WBC 5.8 03/17/2023   HGB 11.4 (L) 03/17/2023   HCT 35.8 (L)  03/17/2023   MCV 93.7 03/17/2023   PLT 244 03/17/2023   Lab Results  Component Value Date   ALT 9 04/21/2022   AST 21 04/21/2022   ALKPHOS 127 (H) 04/21/2022   BILITOT 0.6 04/21/2022   No results found for: "25OHVITD2", "25OHVITD3", "VD25OH"   Review of Systems  Respiratory:  Negative for chest tightness, shortness of breath and wheezing.   Cardiovascular:  Negative for chest pain, palpitations and PND.  Gastrointestinal:  Negative for anal bleeding, blood in stool and bowel incontinence.  Genitourinary:  Negative for bladder incontinence, frequency and hematuria.  Musculoskeletal:  Positive for back pain. Negative for arthralgias, joint swelling, myalgias and neck pain.  Neurological:  Negative for headaches and paresthesias.    Patient Active Problem List   Diagnosis Date Noted   Sinus node dysfunction (HCC) 03/17/2023   Symptomatic bradycardia 03/16/2023   CKD stage 3b, GFR 30-44 ml/min (HCC) 03/16/2023   Hypermagnesemia 03/16/2023   AKI (acute kidney injury) (HCC) 03/16/2023   Edema of lower extremity 08/30/2020   Anemia in chronic kidney disease 08/30/2020   Chronic venous insufficiency 05/11/2020   Aphasia 02/07/2019   Chronic kidney disease, stage 4 (severe) (HCC) 02/07/2019   B12 deficiency 04/28/2016   Essential hypertension 03/27/2015   Hypothyroidism 03/27/2015   Hyperlipidemia 03/27/2015   Allergic rhinitis due to pollen 03/27/2015    No Known Allergies  Past Surgical History:  Procedure Laterality Date   COLONOSCOPY  2012   normal- Dr Beth Brooke   ECTOPIC PREGNANCY SURGERY  PACEMAKER IMPLANT N/A 03/18/2023   Procedure: PACEMAKER IMPLANT;  Surgeon: Efraim Grange, MD;  Location: [facility];  Service: Cardiovascular;  Laterality: N/A;    Social History   Tobacco Use   Smoking status: Never    Passive exposure: Never   Smokeless tobacco: Never   Tobacco comments:    smoking cessation materials not required  Vaping Use   Vaping status:  Never Used  Substance Use Topics   Alcohol use: No    Alcohol/week: 0.0 standard drinks of alcohol   Drug use: No     Medication list has been reviewed and updated.  Current Meds  Medication Sig   amLODipine  (NORVASC ) 5 MG tablet Take 1 tablet (5 mg total) by mouth 2 (two) times daily.   Calcium  Carb-Cholecalciferol  (CALCIUM  600 + D PO) Take 1 capsule by mouth 2 (two) times daily.   Cholecalciferol  (VITAMIN D3) 50 MCG (2000 UT) CAPS Take 1 capsule by mouth daily.   clopidogrel  (PLAVIX ) 75 MG tablet TAKE 1 TABLET BY MOUTH EVERY DAY   cyanocobalamin  (VITAMIN B12) 1000 MCG/ML injection INJECT 1ML ONCE A MONTH   cyclobenzaprine  (FLEXERIL ) 10 MG tablet Take 1 tablet (10 mg total) by mouth 3 (three) times daily as needed for muscle spasms.   fluticasone  (FLONASE ) 50 MCG/ACT nasal spray Place 2 sprays into both nostrils daily.   hydrochlorothiazide  (HYDRODIURIL ) 12.5 MG tablet TAKE 1 TABLET BY MOUTH EVERY DAY   levothyroxine  (SYNTHROID ) 50 MCG tablet Take 1 tablet (50 mcg total) by mouth daily.   magnesium oxide (MAG-OX) 400 (240 Mg) MG tablet Take 400 mg by mouth every other day.   Multiple Vitamins-Minerals (OCUVITE ADULT 50+ PO) Take 1 tablet by mouth daily.   predniSONE  (DELTASONE ) 10 MG tablet Take 1 tablet (10 mg total) by mouth daily with breakfast.   simvastatin  (ZOCOR ) 20 MG tablet TAKE 1 TABLET BY MOUTH EVERY DAY   valsartan  (DIOVAN ) 80 MG tablet TAKE 1 TABLET BY MOUTH 2 TIMES DAILY.   vitamin C (ASCORBIC ACID) 500 MG tablet Take 500 mg by mouth daily.       12/24/2023    1:39 PM 12/10/2023    2:47 PM 05/12/2023    1:19 PM 04/27/2023    4:26 PM  GAD 7 : Generalized Anxiety Score  Nervous, Anxious, on Edge 0 0 0 0  Control/stop worrying 0 0 0 0  Worry too much - different things 0  0 0  Trouble relaxing 0  0 0  Restless 0  0 0  Easily annoyed or irritable 0  0 0  Afraid - awful might happen 0  0 0  Total GAD 7 Score 0  0 0  Anxiety Difficulty Somewhat difficult  Not difficult  at all Not difficult at all       12/24/2023    1:39 PM 12/10/2023    2:47 PM 05/12/2023    1:19 PM  Depression screen PHQ 2/9  Decreased Interest 0 0 0  Down, Depressed, Hopeless 0 0 0  PHQ - 2 Score 0 0 0  Altered sleeping 0  0  Tired, decreased energy 0  0  Change in appetite 1  0  Feeling bad or failure about yourself  0  0  Trouble concentrating 0  0  Moving slowly or fidgety/restless 1  0  Suicidal thoughts   0  PHQ-9 Score 2  0  Difficult doing work/chores Not difficult at all  Not difficult at all  BP Readings from Last 3 Encounters:  12/24/23 130/70  12/10/23 134/70  06/26/23 (!) 164/92    Physical Exam Vitals and nursing note reviewed.  Constitutional:      General: She is not in acute distress.    Appearance: She is not diaphoretic.  HENT:     Head: Normocephalic and atraumatic.     Right Ear: External ear normal.     Left Ear: External ear normal.     Nose: Nose normal. No congestion or rhinorrhea.  Eyes:     General:        Right eye: No discharge.        Left eye: No discharge.     Conjunctiva/sclera: Conjunctivae normal.     Pupils: Pupils are equal, round, and reactive to light.  Neck:     Thyroid : No thyromegaly.     Vascular: No JVD.  Cardiovascular:     Rate and Rhythm: Normal rate and regular rhythm.     Heart sounds: Normal heart sounds. No murmur heard.    No friction rub. No gallop.  Pulmonary:     Effort: Pulmonary effort is normal.     Breath sounds: Normal breath sounds. No wheezing, rhonchi or rales.  Abdominal:     General: Bowel sounds are normal.     Palpations: Abdomen is soft. There is no mass.     Tenderness: There is no abdominal tenderness. There is no guarding.  Musculoskeletal:        General: Normal range of motion.     Cervical back: Normal range of motion and neck supple.  Lymphadenopathy:     Cervical: No cervical adenopathy.  Skin:    General: Skin is warm and dry.  Neurological:     Mental Status: She is  alert.     Deep Tendon Reflexes: Reflexes are normal and symmetric.     Wt Readings from Last 3 Encounters:  12/24/23 137 lb 4 oz (62.3 kg)  12/10/23 141 lb 2 oz (64 kg)  06/26/23 140 lb 9.6 oz (63.8 kg)    BP 130/70   Pulse 74   Ht 5\' 2"  (1.575 m)   Wt 137 lb 4 oz (62.3 kg)   SpO2 97%   BMI 25.10 kg/m   Assessment and Plan:  1. Essential hypertension (Primary) Chronic.  Controlled.  Stable.  Blood pressure 130/70.  Asymptomatic.  Tolerating medications well.  Will continue amlodipine  5 mg Rams twice a day and valsartan  80 mg twice a day.  Will recheck patient in 6 months. - amLODipine  (NORVASC ) 5 MG tablet; Take 1 tablet (5 mg total) by mouth 2 (two) times daily.  Dispense: 180 tablet; Refill: 1 - valsartan  (DIOVAN ) 80 MG tablet; Take 1 tablet (80 mg total) by mouth 2 (two) times daily.  Dispense: 180 tablet; Refill: 1  2. Essential (primary) hypertension Patient is also on hydrochlorothiazide  this in the end of her second placement patient will return for recheck of blood pressure at which time we will do renal function panel. - hydrochlorothiazide  (HYDRODIURIL ) 12.5 MG tablet; Take 1 tablet (12.5 mg total) by mouth daily.  Dispense: 90 tablet; Refill: 1  3. Closed compression fracture of body of L1 vertebra (HCC) Patient's status post fall with lower back pain that was noted to be likely due to multiple compression fractures 1 of which was there prior to the fall.  Because of the pain in limitations patient was started on calcitonin and there has been some decrease in the  pain as well as increase in functionality.  However given that she is got multiple compression fractures we will refer to orthopedic surgery in [place] Monday for evaluation of the possibility of need for kyphoplasty and we will refer to endocrine chronology for consideration of Prolia in the future since she is at an age that maintaining Fosamax may be difficult due to side effects - Ambulatory referral to  Orthopedic Surgery - Ambulatory referral to Endocrinology  4. Hyperparathyroidism due to end stage renal disease on dialysis [facility]) Patient is followed by nephrology for CKD and resultant hyperparathyroidism.  We will refer to endocrinology if Prolia is an acceptable alternative given her current CKD level. - Ambulatory referral to Endocrinology    Alayne Allis, MD

## 2023-12-25 ENCOUNTER — Ambulatory Visit: Payer: Self-pay | Admitting: Cardiovascular Disease

## 2023-12-28 ENCOUNTER — Telehealth: Payer: Self-pay | Admitting: Cardiovascular Disease

## 2023-12-28 NOTE — Telephone Encounter (Signed)
 Patient called to report for her appointment on 5/20 that her medications have not changed.

## 2023-12-28 NOTE — Progress Notes (Signed)
  Cardiology Office Note:  .   Date:  12/28/2023  ID:  Harvey Linen, DOB 07-29-1936, MRN [id number] PCP: Clarise Crooks, MD  [facility] Providers Cardiologist:  None Electrophysiologist:  Efraim Grange, MD {  History of Present Illness: .   Carly Marshall is a 88 y.o. 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 Arlester Ladd Nov 2024, doing well, no changes made Mentioning no AFib via device given her prior hx of TIA  Today's visit is scheduled as a 6 mo visit  ROS:   She feels well Has been bothered for a couple week with back spasms, her PMD treated with prednisone  and muscle relaxer and are much better. No CP, palpitations or cardiac awareness No SOB, DOE No near syncope or syncope  Device information MDT dual chamber PPM implanted 03/17/24   Studies Reviewed: Aaron Aas    EKG not done today  DEVICE interrogation done today and reviewed by myself Battery and lead measurements are good She had an episode of AFlutter lasting just over 5 hours and another for seconds  No HVR episodes  TTE March 18, 2023 EF 60 to 65%.  Left atrium moderately dilated   02/08/2019: TTE 1. The left ventricle has normal systolic function with an ejection  fraction of 60-65%. The cavity size was normal. There is moderately  increased left ventricular wall thickness. Left ventricular diastolic  parameters were normal.   2. The right ventricle has normal systolic function. The cavity was  normal. There is no increase in right ventricular wall thickness.   3. Mitral valve regurgitation is mild to moderate by color flow Doppler.   4. The aortic valve is tricuspid. Aortic valve regurgitation was not  assessed by color flow Doppler.    Risk Assessment/Calculations:    Physical Exam:   VS:  There were no vitals taken for this visit.   Wt Readings from Last 3 Encounters:   12/24/23 137 lb 4 oz (62.3 kg)  12/10/23 141 lb 2 oz (64 kg)  06/26/23 140 lb 9.6 oz (63.8 kg)    GEN: Well nourished, well developed in no acute distress NECK: No JVD; No carotid bruits CARDIAC: RRR, no murmurs, rubs, gallops RESPIRATORY:  CTA b/l without rales, wheezing or rhonchi  ABDOMEN: Soft, non-tender, non-distended EXTREMITIES: No edema; No deformity   PPM site: is stable, no thinning, fluctuation, tethering  ASSESSMENT AND PLAN: .    PPM stable device findings no changes made  2. AFlutter CHA2DS2Vasc is 5 Episode lasted just longer then 5 hours No symptoms and rate controlled (paced) We discussed her hx of TIA and risk of stroke with AFlutter (or AFib)  Burden is quite low though given her history am inclined to start OAC We discussed rational for a/c (stop plavix )  She is agreeable She has no heme/bleeding history, no recent surgeries She does have CKD > Creat 1.4 > 1.5's \\will  get labs today to decide Eliquis vs Xarelto and dosing.  Xarelto may be the better agent for her given her Creat wobbles around 1.4-1.5 or so   Dispo: back in 2 mo, sooner if needed  Signed, Debbie Fails, PA-C

## 2023-12-29 ENCOUNTER — Ambulatory Visit: Attending: Physician Assistant | Admitting: Physician Assistant

## 2023-12-29 ENCOUNTER — Encounter: Payer: Self-pay | Admitting: Physician Assistant

## 2023-12-29 VITALS — BP 134/80 | HR 73 | Ht 62.0 in | Wt 135.4 lb

## 2023-12-29 DIAGNOSIS — Z950 Presence of cardiac pacemaker: Secondary | ICD-10-CM

## 2023-12-29 DIAGNOSIS — Z79899 Other long term (current) drug therapy: Secondary | ICD-10-CM | POA: Diagnosis not present

## 2023-12-29 DIAGNOSIS — I4892 Unspecified atrial flutter: Secondary | ICD-10-CM | POA: Diagnosis not present

## 2023-12-29 LAB — CUP PACEART INCLINIC DEVICE CHECK
Battery Remaining Longevity: 121 mo
Battery Voltage: 3.05 V
Brady Statistic AP VP Percent: 91.05 %
Brady Statistic AP VS Percent: 0.09 %
Brady Statistic AS VP Percent: 8.11 %
Brady Statistic AS VS Percent: 0.75 %
Brady Statistic RA Percent Paced: 91.66 %
Brady Statistic RV Percent Paced: 99.16 %
Date Time Interrogation Session: 20250520151250
Implantable Lead Connection Status: 753985
Implantable Lead Connection Status: 753985
Implantable Lead Implant Date: 20240807
Implantable Lead Implant Date: 20240807
Implantable Lead Location: 753859
Implantable Lead Location: 753860
Implantable Lead Model: 3830
Implantable Lead Model: 5076
Implantable Pulse Generator Implant Date: 20240807
Lead Channel Impedance Value: 342 Ohm
Lead Channel Impedance Value: 361 Ohm
Lead Channel Impedance Value: 437 Ohm
Lead Channel Impedance Value: 532 Ohm
Lead Channel Pacing Threshold Amplitude: 0.625 V
Lead Channel Pacing Threshold Amplitude: 1.25 V
Lead Channel Pacing Threshold Pulse Width: 0.4 ms
Lead Channel Pacing Threshold Pulse Width: 0.4 ms
Lead Channel Sensing Intrinsic Amplitude: 2.375 mV
Lead Channel Sensing Intrinsic Amplitude: 2.875 mV
Lead Channel Sensing Intrinsic Amplitude: 9 mV
Lead Channel Sensing Intrinsic Amplitude: 9.5 mV
Lead Channel Setting Pacing Amplitude: 1.5 V
Lead Channel Setting Pacing Amplitude: 2.5 V
Lead Channel Setting Pacing Pulse Width: 0.4 ms
Lead Channel Setting Sensing Sensitivity: 0.9 mV
Zone Setting Status: 755011
Zone Setting Status: 755011

## 2023-12-29 LAB — CBC
Hematocrit: 40.1 % (ref 34.0–46.6)
Hemoglobin: 13.2 g/dL (ref 11.1–15.9)
MCH: 30.1 pg (ref 26.6–33.0)
MCHC: 32.9 g/dL (ref 31.5–35.7)
MCV: 91 fL (ref 79–97)
Platelets: 261 10*3/uL (ref 150–450)
RBC: 4.39 x10E6/uL (ref 3.77–5.28)
RDW: 13.1 % (ref 11.7–15.4)
WBC: 8.2 10*3/uL (ref 3.4–10.8)

## 2023-12-29 NOTE — Patient Instructions (Signed)
 Medication Instructions:    STOP TAKING AND REMOVE THIS MEDICATION FROM YOUR MEDICATION LIST:  PLAVIX    *If you need a refill on your cardiac medications before your next appointment, please call your pharmacy*    Lab Work:  PLEASE GO DOWN STAIRS  [facility]  FIRST FLOOR   ( GET OFF ELEVATORS WALK TOWARDS WAITING AREA LAB LOCATED BY PHARMACY):  BMET AND CBC TODAY     If you have labs (blood work) drawn today and your tests are completely normal, you will receive your results only by: MyChart Message (if you have MyChart) OR A paper copy in the mail If you have any lab test that is abnormal or we need to change your treatment, we will call you to review the results.   Testing/Procedures: NONE ORDERED  TODAY     Follow-Up: At [facility], you and your health needs are our priority.  As part of our continuing mission to provide you with exceptional heart care, our providers are all part of one team.  This team includes your primary Cardiologist (physician) and Advanced Practice Providers or APPs (Physician Assistants and Nurse Practitioners) who all work together to provide you with the care you need, when you need it.   Your next appointment:    2 -3 month(s)  ( CONTACT  CASSIE HALL/ ANGELINE HAMMER FOR EP SCHEDULING ISSUES )   Provider:    You may see Efraim Grange, MD or one of the following Advanced Practice Providers on your designated Care Team:   Mertha Abrahams, [place]   We recommend signing up for the patient portal called "MyChart".  Sign up information is provided on this After Visit Summary.  MyChart is used to connect with patients for Virtual Visits (Telemedicine).  Patients are able to view lab/test results, encounter notes, upcoming appointments, etc.  Non-urgent messages can be sent to your provider as well.   To learn more about what you can do with MyChart, go to [email or web address].   Other Instructions

## 2023-12-30 ENCOUNTER — Ambulatory Visit: Payer: Self-pay | Admitting: Physician Assistant

## 2023-12-30 ENCOUNTER — Telehealth: Payer: Self-pay | Admitting: *Deleted

## 2023-12-30 LAB — BASIC METABOLIC PANEL WITH GFR
BUN/Creatinine Ratio: 21 (ref 12–28)
BUN: 31 mg/dL — ABNORMAL HIGH (ref 8–27)
CO2: 23 mmol/L (ref 20–29)
Calcium: 10.1 mg/dL (ref 8.7–10.3)
Chloride: 99 mmol/L (ref 96–106)
Creatinine, Ser: 1.51 mg/dL — ABNORMAL HIGH (ref 0.57–1.00)
Glucose: 92 mg/dL (ref 70–99)
Potassium: 4.3 mmol/L (ref 3.5–5.2)
Sodium: 140 mmol/L (ref 134–144)
eGFR: 34 mL/min/{1.73_m2} — ABNORMAL LOW (ref >59)

## 2023-12-30 MED ORDER — RIVAROXABAN 15 MG PO TABS: 15.0000 mg | ORAL_TABLET | Freq: Every day | ORAL | 1 refills | Status: DC

## 2023-12-30 NOTE — Telephone Encounter (Signed)
 Lvm for patient to call back about results and medication recommendations

## 2023-12-31 MED ORDER — APIXABAN 2.5 MG PO TABS: 2.5000 mg | ORAL_TABLET | Freq: Two times a day (BID) | ORAL | 3 refills | Status: DC

## 2023-12-31 NOTE — Progress Notes (Signed)
 Spoke with patient that Xarelto is to expensive for wanted to know if any alternative which Renee suggested Eliquis 2.5 BID. Rx has been sent in pharmacy.Patient told to contact office if Rx is still not affordable

## 2024-01-01 NOTE — Telephone Encounter (Signed)
 See previous note

## 2024-01-01 NOTE — Telephone Encounter (Signed)
 Spoke with patient and she states she no longer needed any help. She spoke with Jorge Newcomer and she is ok for right now.

## 2024-01-02 ENCOUNTER — Other Ambulatory Visit: Payer: Self-pay | Admitting: Family Medicine

## 2024-01-02 DIAGNOSIS — E782 Mixed hyperlipidemia: Secondary | ICD-10-CM

## 2024-01-04 ENCOUNTER — Other Ambulatory Visit: Payer: Self-pay | Admitting: Family Medicine

## 2024-01-04 DIAGNOSIS — S32010A Wedge compression fracture of first lumbar vertebra, initial encounter for closed fracture: Secondary | ICD-10-CM

## 2024-01-06 ENCOUNTER — Other Ambulatory Visit: Payer: Self-pay | Admitting: Family Medicine

## 2024-01-06 DIAGNOSIS — S32010A Wedge compression fracture of first lumbar vertebra, initial encounter for closed fracture: Secondary | ICD-10-CM

## 2024-01-11 DIAGNOSIS — M810 Age-related osteoporosis without current pathological fracture: Secondary | ICD-10-CM | POA: Diagnosis not present

## 2024-01-20 ENCOUNTER — Ambulatory Visit: Payer: Self-pay

## 2024-01-20 ENCOUNTER — Telehealth: Payer: Self-pay | Admitting: Cardiovascular Disease

## 2024-01-20 NOTE — Telephone Encounter (Signed)
 Pt c/o medication issue:  1. Name of Medication: apixaban  (ELIQUIS ) 2.5 MG TABS tablet   2. How are you currently taking this medication (dosage and times per day)? As written  3. Are you having a reaction (difficulty breathing--STAT)? no  4. What is your medication issue? Pt is having back spasms and she wants to know if she can take aspirin  with the Eliquis .

## 2024-01-20 NOTE — Telephone Encounter (Signed)
 Spoke with pt who reports she is having back spasms and wants to know if she can take ASA while taking Eliquis .  Advised not to use ASA along with Eliquis .  Advised to use cyclobenzaprine  as prescribed by  Alayne Allis, MD or contact her office for further refills/treatment.

## 2024-01-20 NOTE — Telephone Encounter (Signed)
 FYI Only or Action Required?: FYI only for provider  Patient was last seen in primary care on 12/24/2023 by Clarise Crooks, MD. Called Nurse Triage reporting Back Pain. Symptoms began several days ago. Interventions attempted: OTC medications: tylenol  and Prescription medications: flexeril . Symptoms are: gradually worsening.  Triage Disposition: See PCP When Office is Open (Within 3 Days)  Patient/caregiver understands and will follow disposition?: Yes, will follow disposition  Copied from CRM [id number]. Topic: Clinical - Red Word Triage >> Jan 20, 2024  4:29 PM Crispin Dolphin wrote: Red Word that prompted transfer to Nurse Triage: muscle spasms in back - very painful can hardly get up and down - out of medication cyclobenzaprine  (FLEXERIL ) 10 MG tablet. Provider retired. Reason for Disposition  [1] MODERATE back pain (e.g., interferes with normal activities) AND [2] present > 3 days  Answer Assessment - Initial Assessment Questions 1. ONSET: When did the pain begin?      2-3 days 2. LOCATION: Where does it hurt? (upper, mid or lower back)     Lower/waist area 3. SEVERITY: How bad is the pain?  (e.g., Scale 1-10; mild, moderate, or severe)   - MILD (1-3): Doesn't interfere with normal activities.    - MODERATE (4-7): Interferes with normal activities or awakens from sleep.    - SEVERE (8-10): Excruciating pain, unable to do any normal activities.      10 4. PATTERN: Is the pain constant? (e.g., yes, no; constant, intermittent)      constant 5. RADIATION: Does the pain shoot into your legs or somewhere else?     Into shoulders 6. CAUSE:  What do you think is causing the back pain?      Spasms, has been seen for this in the past, given steroids and flexeril , states this feels the same 7. BACK OVERUSE:  Any recent lifting of heavy objects, strenuous work or exercise?     denies 8. MEDICINES: What have you taken so far for the pain? (e.g., nothing, acetaminophen , NSAIDS)      Tylenol  9. NEUROLOGIC SYMPTOMS: Do you have any weakness, numbness, or problems with bowel/bladder control?     denies 10. OTHER SYMPTOMS: Do you have any other symptoms? (e.g., fever, abdomen pain, burning with urination, blood in urine)       Nauseated, denies GU s/s.  Denies GU s/s. Denies CP, denies SOB. States this feels the same as when she was dx with compression fractures, requesting steroids and more flexeril . Pt states that she has only tried one tylenol . Advised to follow OTC and rx labels for dosing. Pt understands. Scheduled tomorrow.  Protocols used: Back Pain-A-AH

## 2024-01-21 ENCOUNTER — Ambulatory Visit: Admitting: Family Medicine

## 2024-01-21 ENCOUNTER — Ambulatory Visit
Admission: RE | Admit: 2024-01-21 | Discharge: 2024-01-21 | Disposition: A | Discharge status: Home / Self Care | Source: Ambulatory Visit | Attending: Family Medicine | Admitting: Family Medicine

## 2024-01-21 ENCOUNTER — Ambulatory Visit
Admission: RE | Admit: 2024-01-21 | Discharge: 2024-01-21 | Disposition: A | Discharge status: Home / Self Care | Attending: Family Medicine | Admitting: Family Medicine

## 2024-01-21 ENCOUNTER — Encounter: Payer: Self-pay | Admitting: Family Medicine

## 2024-01-21 VITALS — BP 124/76 | HR 85 | Ht 62.0 in | Wt 133.0 lb

## 2024-01-21 DIAGNOSIS — M5489 Other dorsalgia: Secondary | ICD-10-CM | POA: Diagnosis not present

## 2024-01-21 DIAGNOSIS — G8929 Other chronic pain: Secondary | ICD-10-CM | POA: Diagnosis not present

## 2024-01-21 DIAGNOSIS — M47816 Spondylosis without myelopathy or radiculopathy, lumbar region: Secondary | ICD-10-CM | POA: Diagnosis not present

## 2024-01-21 DIAGNOSIS — M51369 Other intervertebral disc degeneration, lumbar region without mention of lumbar back pain or lower extremity pain: Secondary | ICD-10-CM | POA: Diagnosis not present

## 2024-01-21 DIAGNOSIS — S32010A Wedge compression fracture of first lumbar vertebra, initial encounter for closed fracture: Secondary | ICD-10-CM | POA: Diagnosis not present

## 2024-01-21 MED ORDER — METHYLPREDNISOLONE 4 MG PO TBPK: ORAL_TABLET | ORAL | 0 refills | Status: DC

## 2024-01-21 MED ORDER — TIZANIDINE HCL 2 MG PO CAPS: 2.0000 mg | ORAL_CAPSULE | Freq: Three times a day (TID) | ORAL | 0 refills | Status: AC

## 2024-01-21 NOTE — Progress Notes (Signed)
 Acute Office Visit  Subjective:     Patient ID: Carly Marshall, female    DOB: 17-Dec-1935, 88 y.o.   MRN: [id number]  Chief Complaint  Patient presents with   Back Pain    Pt has muscle spasms in x 1 month ago. Started 3 days ago. Pain is consistent. Muscle relaxer's, and prednisone  helped last month.     88 year old female presents to the clinic for acute visit.  Patient reports of upper back pain since last 4 days.  Denies any fall or injury.  She does have a history of osteoporosis has been scheduled for her first Prolia shot.  Recent x-ray concerning for compression fracture of L1 secondary to demineralization.  Patient was referred to orthopedics in the past but she never went.  Patient reports she is overall doing okay.  Her kids live in [place].  States she takes flexeril , steroids and she is out if and is requesting refill.  Patient denies fever, paresthesia, muscle weakness in upper or lower extremities.   Patient is in today for back pain  Review of Systems  All other systems reviewed and are negative.       Objective:    BP 124/76   Pulse 85   Ht 5' 2 (1.575 m)   Wt 133 lb (60.3 kg)   SpO2 97%   BMI 24.33 kg/m    Physical Exam Vitals and nursing note reviewed.  Constitutional:      Appearance: Normal appearance.  HENT:     Head: Normocephalic.     Right Ear: External ear normal.     Left Ear: External ear normal.   Eyes:     Conjunctiva/sclera: Conjunctivae normal.    Cardiovascular:     Rate and Rhythm: Normal rate.  Pulmonary:     Effort: Pulmonary effort is normal. No respiratory distress.  Abdominal:     Palpations: Abdomen is soft.   Musculoskeletal:        General: Normal range of motion.     Comments: Tenderness noted over thoracolumbar region.   Skin:    General: Skin is warm.   Neurological:     Mental Status: She is alert and oriented to person, place, and time.   Psychiatric:        Mood and Affect: Mood normal.     No  results found for any visits on 01/21/24.      Assessment & Plan:   Problem List Items Addressed This Visit   None Visit Diagnoses       Other chronic back pain    -  Primary   Relevant Medications   tizanidine (ZANAFLEX) 2 MG capsule   methylPREDNISolone (MEDROL DOSEPAK) 4 MG TBPK tablet   Other Relevant Orders   DG THORACOLUMBAR SPINE   Ambulatory referral to Neurosurgery     Closed compression fracture of body of L1 vertebra (HCC)       Relevant Medications   tizanidine (ZANAFLEX) 2 MG capsule   Other Relevant Orders   DG THORACOLUMBAR SPINE   Ambulatory referral to Neurosurgery       Meds ordered this encounter  Medications   tizanidine (ZANAFLEX) 2 MG capsule    Sig: Take 1 capsule (2 mg total) by mouth 3 (three) times daily for 7 days.    Dispense:  21 capsule    Refill:  0   methylPREDNISolone (MEDROL DOSEPAK) 4 MG TBPK tablet    Sig: Use as directed.    Dispense:  21 each    Refill:  0  Repeating x-ray, discouraged patient from using Flexeril .  Will do short course of Zanaflex which is safer in elderly.  Medrol Dosepak ordered.  Referral to neurosurgery.  No follow-ups on file.  Vinary K Tamaka Sawin, MD

## 2024-01-22 ENCOUNTER — Ambulatory Visit: Payer: Self-pay | Admitting: Family Medicine

## 2024-01-24 NOTE — Progress Notes (Unsigned)
 Cardiology Office Note  Date:  01/25/2024   ID:  Carly Marshall, DOB 1936/02/01, MRN [id number]  PCP:  Kotturi, Vinay K, MD   Chief Complaint  Patient presents with   Follow-up    9 month follow up pat has been doing well with no complaints of chest pain, chest pressure or SOB, medciation reviewed verbally with patient   HPI:  Ms. Carly Marshall is a 88 year old woman with past medical history of Sinus node dysfunction/symptomatic bradycardia/advanced heart block Mobitz 2 Pacemaker Hypertension Hyperlipidemia Chronic kidney disease stage III, single kidney TIA Hypertensive heart disease Aortic atherosclerosis  Implantation of a MDT dual chamber PPM on 03/18/23 by Dr Arlester Ladd.  Who presents for follow-up of her paroxysmal Afib, flutter, pacer  LOV 8/24 On today's visit, reports having back pain, spasms Can't bend, catches On muscle relaxers, predisone Xray of back: compression fractures Walks with a walker  Drinks plenty Back on hydrochlorothiazide   No PND orthopnea, no leg swelling  Pacer downloads reviewed showing rare episodes of atrial fibrillation, flutter AFlutter(VP) 5 hours duration on pacer download  EKG personally reviewed by myself on todays visit EKG Interpretation Date/Time:  Monday January 25 2024 09:50:02 EDT Ventricular Rate:  62 PR Interval:  210 QRS Duration:  134 QT Interval:  422 QTC Calculation: 428 R Axis:   -22  Text Interpretation: AV dual-paced rhythm with prolonged AV conduction When compared with ECG of 26-Jun-2023 14:38, Premature supraventricular complexes are no longer Present Confirmed by Belva Boyden [phone]) on 01/25/2024 10:16:25 AM    emergency room March 16, 2023 for bradycardia There she was found to have SB, as well as advanced AV block including Mobiz I w periods of persistent 2:1 block and rates into the 30's with no reversible causes transferred to [facility] for further management    underwent implantation of a PPM   Echo 8/24 Left  ventricular ejection fraction, by estimation, is 60 to 65%   Since pacemaker placement, reports that she has felt much better, better energy, Able to walk long distances without having to stop    PMH:   has a past medical history of Allergy, B12 deficiency anemia, Hyperlipidemia, Hypertension, Stroke (HCC), and Thyroid  disease.  PSH:    Past Surgical History:  Procedure Laterality Date   COLONOSCOPY  2012   normal- Dr Beth Brooke   ECTOPIC PREGNANCY SURGERY     PACEMAKER IMPLANT N/A 03/18/2023   Procedure: PACEMAKER IMPLANT;  Surgeon: Efraim Grange, MD;  [facility];  Service: Cardiovascular;  Laterality: N/A;    Current Outpatient Medications  Medication Sig Dispense Refill   amLODipine  (NORVASC ) 5 MG tablet Take 1 tablet (5 mg total) by mouth 2 (two) times daily. 180 tablet 1   apixaban  (ELIQUIS ) 2.5 MG TABS tablet Take 1 tablet (2.5 mg total) by mouth 2 (two) times daily. 60 tablet 3   calcitonin, salmon, (MIACALCIN/FORTICAL) 200 UNIT/ACT nasal spray PLACE 1 SPRAY INTO ALTERNATE NOSTRILS DAILY. 3.7 mL 1   Calcium  Carb-Cholecalciferol  (CALCIUM  600 + D PO) Take 1 capsule by mouth 2 (two) times daily.     Cholecalciferol  (VITAMIN D3) 50 MCG (2000 UT) CAPS Take 1 capsule by mouth daily.     cyanocobalamin  (VITAMIN B12) 1000 MCG/ML injection INJECT 1ML ONCE A MONTH 3 mL 0   cyclobenzaprine  (FLEXERIL ) 10 MG tablet Take 1 tablet (10 mg total) by mouth 3 (three) times daily as needed for muscle spasms. 30 tablet 0   fluticasone  (FLONASE ) 50 MCG/ACT nasal spray Place  2 sprays into both nostrils daily. 16 g 5   hydrochlorothiazide  (HYDRODIURIL ) 12.5 MG tablet Take 1 tablet (12.5 mg total) by mouth daily. 90 tablet 1   levothyroxine  (SYNTHROID ) 50 MCG tablet Take 1 tablet (50 mcg total) by mouth daily. 90 tablet 0   magnesium oxide (MAG-OX) 400 (240 Mg) MG tablet Take 400 mg by mouth every other day.     methylPREDNISolone  (MEDROL  DOSEPAK) 4 MG TBPK tablet Use as directed. 21  each 0   Multiple Vitamins-Minerals (OCUVITE ADULT 50+ PO) Take 1 tablet by mouth daily.     simvastatin  (ZOCOR ) 20 MG tablet TAKE 1 TABLET BY MOUTH EVERY DAY 90 tablet 1   tizanidine  (ZANAFLEX ) 2 MG capsule Take 1 capsule (2 mg total) by mouth 3 (three) times daily for 7 days. 21 capsule 0   valsartan  (DIOVAN ) 80 MG tablet Take 1 tablet (80 mg total) by mouth 2 (two) times daily. 180 tablet 1   vitamin C (ASCORBIC ACID) 500 MG tablet Take 500 mg by mouth daily.     No current facility-administered medications for this visit.    Allergies:   Patient has no known allergies.   Social History:  The patient  reports that she has never smoked. She has never been exposed to tobacco smoke. She has never used smokeless tobacco. She reports that she does not drink alcohol and does not use drugs.   Family History:   family history includes Brain cancer in her brother; Diabetes in her mother and sister; Fibromyalgia in her mother; Heart disease in her father and mother; Hyperlipidemia in her father; Hypertension in her father; Rheum arthritis in her sister.   Review of Systems: Review of Systems  Constitutional: Negative.   HENT: Negative.    Respiratory: Negative.    Cardiovascular: Negative.   Gastrointestinal: Negative.   Musculoskeletal:  Positive for back pain.  Neurological: Negative.   Psychiatric/Behavioral: Negative.    All other systems reviewed and are negative.  PHYSICAL EXAM: VS:  BP 110/62 (BP Location: Left Arm, Patient Position: Sitting, Cuff Size: Normal)   Pulse 62   Ht 5' 2 (1.575 m)   Wt 135 lb 9.6 oz (61.5 kg)   SpO2 97%   BMI 24.80 kg/m  , BMI Body mass index is 24.8 kg/m. Constitutional:  oriented to person, place, and time. No distress.  HENT:  Head: Grossly normal Eyes:  no discharge. No scleral icterus.  Neck: No JVD, no carotid bruits  Cardiovascular: Regular rate and rhythm, no murmurs appreciated Pulmonary/Chest: Clear to auscultation bilaterally, no  wheezes or rails Abdominal: Soft.  no distension.  no tenderness.  Musculoskeletal: Normal range of motion Neurological:  normal muscle tone. Coordination normal. No atrophy Skin: Skin warm and dry Psychiatric: normal affect, pleasant   Recent Labs: 03/16/2023: TSH 2.448 03/17/2023: Magnesium 2.2 12/29/2023: BUN 31; Creatinine, Ser 1.51; Hemoglobin 13.2; Platelets 261; Potassium 4.3; Sodium 140    Lipid Panel Lab Results  Component Value Date   CHOL 144 12/22/2022   HDL 77 12/22/2022   LDLCALC 54 12/22/2022   TRIG 65 12/22/2022     Wt Readings from Last 3 Encounters:  01/25/24 135 lb 9.6 oz (61.5 kg)  01/21/24 133 lb (60.3 kg)  12/29/23 135 lb 6.4 oz (61.4 kg)     ASSESSMENT AND PLAN:  Problem List Items Addressed This Visit       Cardiology Problems   Sinus node dysfunction (HCC)   Essential hypertension   Hyperlipidemia  Other   Anemia in chronic kidney disease   Symptomatic bradycardia   CKD stage 3b, GFR 30-44 ml/min (HCC)   Other Visit Diagnoses       Atrial flutter, unspecified type (HCC)    -  Primary   Relevant Orders   EKG 12-Lead (Completed)     Cardiac pacemaker in situ       Relevant Orders   EKG 12-Lead (Completed)     Pacemaker         Hypertensive heart and chronic kidney disease with heart failure and stage 1 through stage 4 chronic kidney disease, or chronic kidney disease (HCC)          Paroxysmal atrial fibrillation/flutter Noted on last 2 pacer downloads Now on Eliquis  2.5 twice daily, reduced dose for renal function, age  Sinus bradycardia/sick sinus syndrome Status post pacemaker, followed by EP  Essential hypertension Blood pressure is well controlled on today's visit. No changes made to the medications.  Chronic renal failure Creatinine 1.6  Followed by nephrology Reports that she drinks plenty, remains on HCTZ  Chronic anemia Hemoglobin stable    Signed, Juanda Noon, M.D., Ph.D. [facility] [place],  [place] [phone]

## 2024-01-25 ENCOUNTER — Encounter: Payer: Self-pay | Admitting: Cardiovascular Disease

## 2024-01-25 ENCOUNTER — Encounter: Payer: Self-pay | Admitting: Family Medicine

## 2024-01-25 ENCOUNTER — Ambulatory Visit: Payer: Medicare HMO | Attending: Cardiovascular Disease | Admitting: Cardiovascular Disease

## 2024-01-25 VITALS — BP 110/62 | HR 62 | Ht 62.0 in | Wt 135.6 lb

## 2024-01-25 DIAGNOSIS — Z950 Presence of cardiac pacemaker: Secondary | ICD-10-CM | POA: Diagnosis not present

## 2024-01-25 DIAGNOSIS — I495 Sick sinus syndrome: Secondary | ICD-10-CM

## 2024-01-25 DIAGNOSIS — I4892 Unspecified atrial flutter: Secondary | ICD-10-CM | POA: Diagnosis not present

## 2024-01-25 DIAGNOSIS — N189 Chronic kidney disease, unspecified: Secondary | ICD-10-CM | POA: Diagnosis not present

## 2024-01-25 DIAGNOSIS — N1832 Chronic kidney disease, stage 3b: Secondary | ICD-10-CM | POA: Diagnosis not present

## 2024-01-25 DIAGNOSIS — D631 Anemia in chronic kidney disease: Secondary | ICD-10-CM

## 2024-01-25 DIAGNOSIS — I10 Essential (primary) hypertension: Secondary | ICD-10-CM

## 2024-01-25 DIAGNOSIS — E782 Mixed hyperlipidemia: Secondary | ICD-10-CM | POA: Diagnosis not present

## 2024-01-25 DIAGNOSIS — I130 Hypertensive heart and chronic kidney disease with heart failure and stage 1 through stage 4 chronic kidney disease, or unspecified chronic kidney disease: Secondary | ICD-10-CM

## 2024-01-25 DIAGNOSIS — R001 Bradycardia, unspecified: Secondary | ICD-10-CM | POA: Diagnosis not present

## 2024-01-25 NOTE — Patient Instructions (Signed)

## 2024-01-26 NOTE — Telephone Encounter (Signed)
 Please review message

## 2024-01-27 DIAGNOSIS — M810 Age-related osteoporosis without current pathological fracture: Secondary | ICD-10-CM | POA: Diagnosis not present

## 2024-02-02 ENCOUNTER — Telehealth: Payer: Self-pay | Admitting: Family Medicine

## 2024-02-02 NOTE — Telephone Encounter (Signed)
 Copied from CRM [id number]. Topic: General - Other >> Feb 02, 2024  8:20 AM Elle L wrote: Reason for CRM: The patient called to confirm that Dr. Sol is her new Primary Care Provider. I confirmed that he is. She received a letter from her insurance company stating they needed a transition of care letter from the practice and their number is [phone] in order to receive the form. She did not have their fax number. The patient's call back number is [phone] if needed.

## 2024-02-04 NOTE — Progress Notes (Deleted)
 Referring Physician:  Kotturi, Vinay K, MD [address] [place],  [place] [address]  Primary Physician:  Kotturi, Vinay K, MD  History of Present Illness: 02/04/2024 Ms. Carly Marshall is here today with a chief complaint of ***  Compression fractures T10-L1    Carly Marshall has ***no symptoms of cervical myelopathy.  The symptoms are causing a significant impact on the patient's life.   Review of Systems:  A 10 point review of systems is negative, except for the pertinent positives and negatives detailed in the HPI.  Past Medical History: Past Medical History:  Diagnosis Date   Allergy    B12 deficiency anemia    Hyperlipidemia    Hypertension    Stroke [facility])    Thyroid  disease     Past Surgical History: Past Surgical History:  Procedure Laterality Date   COLONOSCOPY  2012   normal- Dr Reeta   ECTOPIC PREGNANCY SURGERY     PACEMAKER IMPLANT N/A 03/18/2023   Procedure: PACEMAKER IMPLANT;  Surgeon: Nancey Eulas BRAVO, MD;  [facility];  Service: Cardiovascular;  Laterality: N/A;    Allergies: Allergies as of 02/09/2024   (No Known Allergies)    Medications: Outpatient Encounter Medications as of 02/09/2024  Medication Sig   amLODipine  (NORVASC ) 5 MG tablet Take 1 tablet (5 mg total) by mouth 2 (two) times daily.   apixaban  (ELIQUIS ) 2.5 MG TABS tablet Take 1 tablet (2.5 mg total) by mouth 2 (two) times daily.   calcitonin, salmon, (MIACALCIN/FORTICAL) 200 UNIT/ACT nasal spray PLACE 1 SPRAY INTO ALTERNATE NOSTRILS DAILY.   Calcium  Carb-Cholecalciferol  (CALCIUM  600 + D PO) Take 1 capsule by mouth 2 (two) times daily.   Cholecalciferol  (VITAMIN D3) 50 MCG (2000 UT) CAPS Take 1 capsule by mouth daily.   cyanocobalamin  (VITAMIN B12) 1000 MCG/ML injection INJECT 1ML ONCE A MONTH   cyclobenzaprine  (FLEXERIL ) 10 MG tablet Take 1 tablet (10 mg total) by mouth 3 (three) times daily as needed for muscle spasms.   fluticasone  (FLONASE ) 50 MCG/ACT  nasal spray Place 2 sprays into both nostrils daily.   hydrochlorothiazide  (HYDRODIURIL ) 12.5 MG tablet Take 1 tablet (12.5 mg total) by mouth daily.   levothyroxine  (SYNTHROID ) 50 MCG tablet Take 1 tablet (50 mcg total) by mouth daily.   magnesium oxide (MAG-OX) 400 (240 Mg) MG tablet Take 400 mg by mouth every other day.   methylPREDNISolone  (MEDROL  DOSEPAK) 4 MG TBPK tablet Use as directed.   Multiple Vitamins-Minerals (OCUVITE ADULT 50+ PO) Take 1 tablet by mouth daily.   simvastatin  (ZOCOR ) 20 MG tablet TAKE 1 TABLET BY MOUTH EVERY DAY   valsartan  (DIOVAN ) 80 MG tablet Take 1 tablet (80 mg total) by mouth 2 (two) times daily.   vitamin C (ASCORBIC ACID) 500 MG tablet Take 500 mg by mouth daily.   No facility-administered encounter medications on file as of 02/09/2024.    Social History: Social History   Tobacco Use   Smoking status: Never    Passive exposure: Never   Smokeless tobacco: Never   Tobacco comments:    smoking cessation materials not required  Vaping Use   Vaping status: Never Used  Substance Use Topics   Alcohol use: No    Alcohol/week: 0.0 standard drinks of alcohol   Drug use: No    Family Medical History: Family History  Problem Relation Age of Onset   Diabetes Mother    Heart disease Mother    Fibromyalgia Mother    Heart  disease Father    Hyperlipidemia Father    Hypertension Father    Diabetes Sister    Brain cancer Brother    Rheum arthritis Sister     Physical Examination: @VITALWITHPAIN @  General: Patient is well developed, well nourished, calm, collected, and in no apparent distress. Attention to examination is appropriate.  Psychiatric: Patient is non-anxious.  Head:  Pupils equal, round, and reactive to light.  ENT:  Oral mucosa appears well hydrated.  Neck:   Supple.  ***Full range of motion.  Respiratory: Patient is breathing without any difficulty.  Extremities: No edema.  Vascular: Palpable dorsal pedal pulses.  Skin:   On  exposed skin, there are no abnormal skin lesions.  NEUROLOGICAL:     Awake, alert, oriented to person, place, and time.  Speech is clear and fluent. Fund of knowledge is appropriate.   Cranial Nerves: Pupils equal round and reactive to light.  Facial tone is symmetric.  Facial sensation is symmetric.  ROM of spine: ***full.  Palpation of spine: ***non tender.    Strength: Side Biceps Triceps Deltoid Interossei Grip Wrist Ext. Wrist Flex.  R 5 5 5 5 5 5 5   L 5 5 5 5 5 5 5    Side Iliopsoas Quads Hamstring PF DF EHL  R 5 5 5 5 5 5   L 5 5 5 5 5 5    Reflexes are ***2+ and symmetric at the biceps, triceps, brachioradialis, patella and achilles.   Hoffman's is absent.  Clonus is not present.  Toes are down-going.  Bilateral upper and lower extremity sensation is intact to light touch.    Gait is normal.   No difficulty with tandem gait.   No evidence of dysmetria noted.  Medical Decision Making  Imaging: ***  I have personally reviewed the images and agree with the above interpretation.  Assessment and Plan: Carly Marshall is a pleasant 88 y.o. female with ***    Thank you for involving me in the care of this patient.   I spent a total of *** minutes in both face-to-face and non-face-to-face activities for this visit on the date of this encounter.   Lyle Decamp, PA-C Dept. of Neurosurgery

## 2024-02-05 NOTE — Progress Notes (Signed)
 Remote pacemaker transmission.

## 2024-02-09 ENCOUNTER — Ambulatory Visit: Admitting: Physician Assistant

## 2024-02-15 ENCOUNTER — Other Ambulatory Visit: Payer: Self-pay | Admitting: Family Medicine

## 2024-02-15 DIAGNOSIS — E039 Hypothyroidism, unspecified: Secondary | ICD-10-CM

## 2024-02-15 DIAGNOSIS — E538 Deficiency of other specified B group vitamins: Secondary | ICD-10-CM

## 2024-02-15 NOTE — Telephone Encounter (Unsigned)
 Copied from CRM [id number]. Topic: Clinical - Medication Refill >> Feb 15, 2024  1:06 PM Ivette P wrote: Medication:  cyanocobalamin  (VITAMIN B12) 1000 MCG/ML injection levothyroxine  (SYNTHROID ) 50 MCG tablet  Has the patient contacted their pharmacy? Yes (Agent: If no, request that the patient contact the pharmacy for the refill. If patient does not wish to contact the pharmacy document the reason why and proceed with request.) (Agent: If yes, when and what did the pharmacy advise?)  This is the patient's preferred pharmacy:  [id number] - [place], [place] - [address] [place] [address] Phone: [phone] Fax: [phone]  Is this the correct pharmacy for this prescription? Yes If no, delete pharmacy and type the correct one.   Has the prescription been filled recently? No  Is the patient out of the medication? Yes  Has the patient been seen for an appointment in the last year OR does the patient have an upcoming appointment? Yes, 01/21/2024  Can we respond through MyChart? Yes  Agent: Please be advised that Rx refills may take up to 3 business days. We ask that you follow-up with your pharmacy.

## 2024-02-17 NOTE — Telephone Encounter (Signed)
 Please review and sign if appropriate.  JM

## 2024-02-17 NOTE — Telephone Encounter (Signed)
 Requested medication (s) are due for refill today: yes  Requested medication (s) are on the active medication list: levothyroxine  09/10/23  Last refill:   B12: 11/13/23 3 ml Levothyroxine  09/10/23   Future visit scheduled: no  Notes to clinic:  needs lab work-B12 out of date   Requested Prescriptions  Pending Prescriptions Disp Refills   cyanocobalamin  (VITAMIN B12) 1000 MCG/ML injection 3 mL 0    Sig: INJECT ONCE A MONTH     Endocrinology:  Vitamins - Vitamin B12 Failed - 02/17/2024  1:57 PM      Failed - B12 Level in normal range and within 360 days    Vitamin B-12  Date Value Ref Range Status  11/04/2018 479 232 - 1,245 pg/mL Final         Passed - HCT in normal range and within 360 days    Hematocrit  Date Value Ref Range Status  12/29/2023 40.1 34.0 - 46.6 % Final         Passed - HGB in normal range and within 360 days    Hemoglobin  Date Value Ref Range Status  12/29/2023 13.2 11.1 - 15.9 g/dL Final         Passed - Valid encounter within last 12 months    Recent Outpatient Visits           3 weeks ago Other chronic back pain   [place] [facility] Kotturi, Vinay K, MD   1 month ago Essential hypertension   [place] [facility] Cathryne JAYSON, MD   2 months ago Acute midline low back pain without sciatica   [facility] Cathryne JAYSON, MD       Future Appointments             In 2 weeks Ursuy, Renee Lynn, PA-C [facility] Dept of [employer], H[facility]             levothyroxine  (SYNTHROID ) 50 MCG tablet 90 tablet 0    Sig: Take 1 tablet (50 mcg total) by mouth daily.     Endocrinology:  Hypothyroid Agents Passed - 02/17/2024  1:57 PM      Passed - TSH in normal range and within 360 days    TSH  Date Value Ref Range Status  03/16/2023 2.448 0.350 - 4.500 uIU/mL Final    Comment:     Performed by a 3rd Generation assay with a functional sensitivity of <=0.01 uIU/mL. Performed at [facility], [address] [place], [place] [address]   12/22/2022 2.170 0.450 - 4.500 uIU/mL Final         Passed - Valid encounter within last 12 months    Recent Outpatient Visits           3 weeks ago Other chronic back pain   [place] [facility], Vinay K, MD   1 month ago Essential hypertension   [place] [facility] Cathryne JAYSON, MD   2 months ago Acute midline low back pain without sciatica   [facility], Deanna C, MD       Future Appointments             In 2 weeks  Leverne Charlies Helling, PA-C [facility] Dept of [employer]. [facility], H&V

## 2024-02-23 ENCOUNTER — Ambulatory Visit (INDEPENDENT_AMBULATORY_CARE_PROVIDER_SITE_OTHER): Admitting: Family Medicine

## 2024-02-23 ENCOUNTER — Encounter: Payer: Self-pay | Admitting: Family Medicine

## 2024-02-23 VITALS — BP 122/70 | HR 78 | Ht 62.0 in | Wt 133.1 lb

## 2024-02-23 DIAGNOSIS — E538 Deficiency of other specified B group vitamins: Secondary | ICD-10-CM | POA: Diagnosis not present

## 2024-02-23 DIAGNOSIS — E039 Hypothyroidism, unspecified: Secondary | ICD-10-CM

## 2024-02-23 DIAGNOSIS — M8000XS Age-related osteoporosis with current pathological fracture, unspecified site, sequela: Secondary | ICD-10-CM | POA: Diagnosis not present

## 2024-02-23 DIAGNOSIS — Z95811 Presence of heart assist device: Secondary | ICD-10-CM | POA: Diagnosis not present

## 2024-02-23 DIAGNOSIS — E782 Mixed hyperlipidemia: Secondary | ICD-10-CM

## 2024-02-23 DIAGNOSIS — I4892 Unspecified atrial flutter: Secondary | ICD-10-CM | POA: Diagnosis not present

## 2024-02-23 MED ORDER — CYANOCOBALAMIN 1000 MCG/ML IJ SOLN: INTRAMUSCULAR | 5 refills | Status: AC

## 2024-02-23 NOTE — Assessment & Plan Note (Signed)
 Recheck TSH, Refill pending lab results.

## 2024-02-23 NOTE — Assessment & Plan Note (Signed)
 Controlled with B12 injections monthly.

## 2024-02-23 NOTE — Progress Notes (Unsigned)
 Established Patient Office Visit  Subjective   Patient ID: CZARINA GINGRAS, female    DOB: 16-Jun-1936  Age: 88 y.o. MRN: [id number]  Chief Complaint  Patient presents with   Medication Refill    Vitamin B12 and levothyroxine  medication needs labs before refill     Assessment & Plan:   Problem List Items Addressed This Visit       Endocrine   Hypothyroidism - Primary   Recheck TSH, Refill pending lab results.      Relevant Orders   TSH     Other   B12 deficiency   Controlled with B12 injections monthly.      Relevant Medications   cyanocobalamin  (VITAMIN B12) 1000 MCG/ML injection   Presence of heart assist device (HCC)    No follow-ups on file.   88 y/o female here for chronic med meanagement.  Reports she is doing good. Her back is much better.   She is requesting refill for B12 injection and synthroid . Her Daughter who is a Engineer, civil (consulting) does the injections monthly. She got her last shot last month.   Hypothyroidism:  On levothyroxine . Lab Results      Component                Value               Date                      TSH                      2.448               03/16/2023             Vitamin B12 deficiency:  Lab Results      Component                Value               Date                      VITAMINB12               479                 11/04/2018                 Review of Systems  All other systems reviewed and are negative.     Objective:     BP 122/70   Pulse 78   Ht 5' 2 (1.575 m)   Wt 133 lb 2 oz (60.4 kg)   SpO2 98%   BMI 24.35 kg/m    Physical Exam Vitals and nursing note reviewed.  Constitutional:      Appearance: Normal appearance.  HENT:     Head: Normocephalic.     Right Ear: External ear normal.     Left Ear: External ear normal.  Eyes:     Conjunctiva/sclera: Conjunctivae normal.  Cardiovascular:     Rate and Rhythm: Normal rate.  Pulmonary:     Effort: Pulmonary effort is normal. No respiratory distress.   Abdominal:     Palpations: Abdomen is soft.  Musculoskeletal:        General: Normal range of motion.  Skin:    General: Skin is warm.  Neurological:     Mental Status: She is alert and oriented to person, place, and  time.  Psychiatric:        Mood and Affect: Mood normal.      No results found for any visits on 02/23/24.    The ASCVD Risk score (Arnett DK, et al., 2019) failed to calculate for the following reasons:   The 2019 ASCVD risk score is only valid for ages 21 to 39   Risk score cannot be calculated because patient has a medical history suggesting prior/existing ASCVD      Vinary K Riyad Keena, MD

## 2024-02-24 DIAGNOSIS — I4892 Unspecified atrial flutter: Secondary | ICD-10-CM | POA: Insufficient documentation

## 2024-02-24 DIAGNOSIS — M8000XA Age-related osteoporosis with current pathological fracture, unspecified site, initial encounter for fracture: Secondary | ICD-10-CM | POA: Insufficient documentation

## 2024-02-24 LAB — TSH: TSH: 1.33 u[IU]/mL (ref 0.450–4.500)

## 2024-02-24 MED ORDER — LEVOTHYROXINE SODIUM 50 MCG PO TABS: 50.0000 ug | ORAL_TABLET | Freq: Every day | ORAL | 1 refills | Status: DC

## 2024-02-24 NOTE — Assessment & Plan Note (Signed)
 Managed by Cardiology.

## 2024-02-24 NOTE — Assessment & Plan Note (Signed)
 On Simvastatin  20  mg every day, refills in place.

## 2024-02-24 NOTE — Assessment & Plan Note (Signed)
 On OAC, managed by cardiology.

## 2024-02-24 NOTE — Assessment & Plan Note (Signed)
 On Prolia every 6 months,  follows [facility]

## 2024-03-06 NOTE — Progress Notes (Unsigned)
  Cardiology Office Note:  .   Date:  03/06/2024  ID:  Carly Marshall, DOB 07-Jan-1936, MRN [id number] PCP: Kotturi, Vinay K, MD  [facility] Providers Cardiologist:  Dr. Perla EP: Dr. Nancey  History of Present Illness: .   Carly Marshall is a 88 y.o. 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 Nancey Nov 2024, doing well, no changes made Mentioning no AFib via device given her prior hx of TIA  I saw her 12/29/23 She feels well Has been bothered for a couple week with back spasms, her PMD treated with prednisone  and muscle relaxer and are much better. No CP, palpitations or cardiac awareness No SOB, DOE No near syncope or syncope Device check noted an episode of AFlutter lasting just over 5 hours and another for seconds  Discussed with the pt, given hx of TIA > rec OAC, she was agreeable Labs done, initially rec xarelto  > was very expensive > Eliquis  2.5mg  BID  She saw Dr. Gollan 01/25/24 C/w back trouble, no cardiac complaints No changes were made  Today's visit is scheduled as a 2-42mo visit ROS:   She is accompanied by her daughter Doing well No cardiac awareness or concerns Tolerating Eliquis , no bleeding or signs of bleeding  Back pain (compression fractures) is better of late, getting around better   Device information MDT dual chamber PPM implanted 03/17/24   Studies Reviewed: SABRA    EKG not done today  DEVICE interrogation done today to assess arrhythmia burden and reviewed by myself Battery and auto lead measurements are good + AFib  12/31/23 : another 5 hour episode and a near 5 minute episode Rate controlled/paced No HVR episodes  TTE March 18, 2023 EF 60 to 65%.  Left atrium moderately dilated   02/08/2019: TTE 1. The left ventricle has normal systolic function with an ejection  fraction of 60-65%. The cavity size was normal. There  is moderately  increased left ventricular wall thickness. Left ventricular diastolic  parameters were normal.   2. The right ventricle has normal systolic function. The cavity was  normal. There is no increase in right ventricular wall thickness.   3. Mitral valve regurgitation is mild to moderate by color flow Doppler.   4. The aortic valve is tricuspid. Aortic valve regurgitation was not  assessed by color flow Doppler.    Risk Assessment/Calculations:    Physical Exam:   VS:  There were no vitals taken for this visit.   Wt Readings from Last 3 Encounters:  02/23/24 133 lb 2 oz (60.4 kg)  01/25/24 135 lb 9.6 oz (61.5 kg)  01/21/24 133 lb (60.3 kg)    GEN: Well nourished, well developed in no acute distress NECK: No JVD; No carotid bruits CARDIAC:  RRR, no murmurs, rubs, gallops RESPIRATORY: CTA b/l without rales, wheezing or rhonchi  ABDOMEN: Soft, non-tender, non-distended EXTREMITIES: No edema; No deformity   PPM site: is stable, no thinning, fluctuation, tethering  ASSESSMENT AND PLAN: .    PPM intact function no programming changes made  2. Paroxysmal atrial fibrillation/AFlutter CHA2DS2Vasc is 5 on eliquis , appropriately dosed 0.3 % burden  3. Secondary hypercoagulable state     Dispo: back in 6 mo, sooner if needed  Signed, Carly Macario Arthur, PA-C

## 2024-03-08 ENCOUNTER — Other Ambulatory Visit: Payer: Self-pay | Admitting: Family Medicine

## 2024-03-08 ENCOUNTER — Encounter: Payer: Self-pay | Admitting: Physician Assistant

## 2024-03-08 ENCOUNTER — Ambulatory Visit: Attending: Physician Assistant | Admitting: Physician Assistant

## 2024-03-08 VITALS — BP 126/80 | HR 79 | Ht 62.0 in | Wt 131.7 lb

## 2024-03-08 DIAGNOSIS — Z950 Presence of cardiac pacemaker: Secondary | ICD-10-CM | POA: Diagnosis not present

## 2024-03-08 DIAGNOSIS — D6869 Other thrombophilia: Secondary | ICD-10-CM | POA: Diagnosis not present

## 2024-03-08 DIAGNOSIS — I480 Paroxysmal atrial fibrillation: Secondary | ICD-10-CM

## 2024-03-08 DIAGNOSIS — S32010A Wedge compression fracture of first lumbar vertebra, initial encounter for closed fracture: Secondary | ICD-10-CM

## 2024-03-08 MED ORDER — APIXABAN 2.5 MG PO TABS: 2.5000 mg | ORAL_TABLET | Freq: Two times a day (BID) | ORAL | 11 refills | Status: DC

## 2024-03-08 NOTE — Patient Instructions (Signed)
 Medication Instructions:  Your physician recommends that you continue on your current medications as directed. Please refer to the Current Medication list given to you today.  *If you need a refill on your cardiac medications before your next appointment, please call your pharmacy*  Follow-Up: At [facility], you and your health needs are our priority.  As part of our continuing mission to provide you with exceptional heart care, our providers are all part of one team.  This team includes your primary Cardiologist (physician) and Advanced Practice Providers or APPs (Physician Assistants and Nurse Practitioners) who all work together to provide you with the care you need, when you need it.  Your next appointment:   6 month(s)  Provider:   Charlies Arthur, PA-C   We recommend signing up for the patient portal called MyChart.  Sign up information is provided on this After Visit Summary.  MyChart is used to connect with patients for Virtual Visits (Telemedicine).  Patients are able to view lab/test results, encounter notes, upcoming appointments, etc.  Non-urgent messages can be sent to your provider as well.   To learn more about what you can do with MyChart, go to [email or web address].

## 2024-03-08 NOTE — Telephone Encounter (Signed)
 Copied from CRM [id number]. Topic: Clinical - Medication Refill >> Mar 08, 2024  9:54 AM Carly Marshall wrote: Medication: calcitonin, salmon, (MIACALCIN/FORTICAL) 200 UNIT/ACT nasal spray  Has the patient contacted their pharmacy? Yes (Agent: If no, request that the patient contact the pharmacy for the refill. If patient does not wish to contact the pharmacy document the reason why and proceed with request.) (Agent: If yes, when and what did the pharmacy advise?)  This is the patient's preferred pharmacy:  [id number] - [place], [place] - [address] [place] [address] Phone: [phone] Fax: [phone]  Is this the correct pharmacy for this prescription? Yes If no, delete pharmacy and type the correct one.   Has the prescription been filled recently? Yes  Is the patient out of the medication? No. Running low  Has the patient been seen for an appointment in the last year OR does the patient have an upcoming appointment? Yes  Can we respond through MyChart? Yes  Agent: Please be advised that Rx refills may take up to 3 business days. We ask that you follow-up with your pharmacy.

## 2024-03-09 NOTE — Telephone Encounter (Signed)
 Requested medication (s) are due for refill today:   No  Being requested too soon  Requested medication (s) are on the active medication list:   Yes  Future visit scheduled:   Yes 9/3 with Dr Sol.     LOV 7/15/205 with Dr. Sol   Last ordered: 01/05/2024 3.7 ml, 1 refill  Unable to refill because bone mineral density test due per protocol    Requested Prescriptions  Pending Prescriptions Disp Refills   calcitonin, salmon, (MIACALCIN/FORTICAL) 200 UNIT/ACT nasal spray 3.7 mL 1    Sig: Place 1 spray into alternate nostrils daily.     Endocrinology:  Parathyroid Agents Failed - 03/09/2024 11:29 AM      Failed - Bone Mineral Density or Dexa Scan completed in the last 2 years      Passed - Ca in normal range and within 360 days    Calcium   Date Value Ref Range Status  12/29/2023 10.1 8.7 - 10.3 mg/dL Final         Passed - Valid encounter within last 12 months    Recent Outpatient Visits           2 weeks ago Hypothyroidism, unspecified type   [facility] at [facility], Vinay K, MD   1 month ago Other chronic back pain   [facility] at [facility], Vinay K, MD   2 months ago Essential hypertension   [place] [facility] at [facility] Cathryne JAYSON, MD   3 months ago Acute midline low back pain without sciatica   [facility] at [facility] Cath[facility] JAYSON, MD

## 2024-03-09 NOTE — Telephone Encounter (Signed)
 Please review.  KP

## 2024-03-10 NOTE — Telephone Encounter (Signed)
 Recommend Endo follow up.

## 2024-03-10 NOTE — Telephone Encounter (Signed)
 Patient follows Endo. Defer it Endocrinology.

## 2024-03-10 NOTE — Telephone Encounter (Signed)
Please review medication refill  request

## 2024-03-11 MED ORDER — CALCITONIN (SALMON) 200 UNIT/ACT NA SOLN: 1.0000 | Freq: Every day | NASAL | 1 refills | Status: AC

## 2024-03-11 NOTE — Telephone Encounter (Signed)
 Endo follow up appointment is schedule for 07/2024.

## 2024-03-22 ENCOUNTER — Ambulatory Visit: Payer: Medicare HMO

## 2024-03-22 DIAGNOSIS — I495 Sick sinus syndrome: Secondary | ICD-10-CM | POA: Diagnosis not present

## 2024-03-23 LAB — CUP PACEART REMOTE DEVICE CHECK
Battery Remaining Longevity: 114 mo
Battery Voltage: 3.02 V
Brady Statistic AP VP Percent: 66.31 %
Brady Statistic AP VS Percent: 0.03 %
Brady Statistic AS VP Percent: 33.11 %
Brady Statistic AS VS Percent: 0.56 %
Brady Statistic RA Percent Paced: 66.47 %
Brady Statistic RV Percent Paced: 99.41 %
Date Time Interrogation Session: 20250812003832
Implantable Lead Connection Status: 753985
Implantable Lead Connection Status: 753985
Implantable Lead Implant Date: 20240807
Implantable Lead Implant Date: 20240807
Implantable Lead Location: 753859
Implantable Lead Location: 753860
Implantable Lead Model: 3830
Implantable Lead Model: 5076
Implantable Pulse Generator Implant Date: 20240807
Lead Channel Impedance Value: 323 Ohm
Lead Channel Impedance Value: 323 Ohm
Lead Channel Impedance Value: 399 Ohm
Lead Channel Impedance Value: 437 Ohm
Lead Channel Pacing Threshold Amplitude: 0.625 V
Lead Channel Pacing Threshold Amplitude: 1.25 V
Lead Channel Pacing Threshold Pulse Width: 0.4 ms
Lead Channel Pacing Threshold Pulse Width: 0.4 ms
Lead Channel Sensing Intrinsic Amplitude: 2.5 mV
Lead Channel Sensing Intrinsic Amplitude: 2.5 mV
Lead Channel Sensing Intrinsic Amplitude: 7.5 mV
Lead Channel Sensing Intrinsic Amplitude: 7.5 mV
Lead Channel Setting Pacing Amplitude: 1.5 V
Lead Channel Setting Pacing Amplitude: 2.5 V
Lead Channel Setting Pacing Pulse Width: 0.4 ms
Lead Channel Setting Sensing Sensitivity: 0.9 mV
Zone Setting Status: 755011
Zone Setting Status: 755011

## 2024-03-27 ENCOUNTER — Ambulatory Visit: Payer: Self-pay | Admitting: Cardiovascular Disease

## 2024-04-03 ENCOUNTER — Other Ambulatory Visit: Payer: Self-pay | Admitting: Family Medicine

## 2024-04-03 DIAGNOSIS — S32010A Wedge compression fracture of first lumbar vertebra, initial encounter for closed fracture: Secondary | ICD-10-CM

## 2024-04-05 NOTE — Telephone Encounter (Signed)
 Requested medication (s) are due for refill today: no  Requested medication (s) are on the active medication list: yes  Last refill:  03/11/24 3.7 ml 1 RF  Future visit scheduled: yes  Notes to clinic:  needs dx code and 90 day prescription   Requested Prescriptions  Pending Prescriptions Disp Refills   calcitonin, salmon, (MIACALCIN/FORTICAL) 200 UNIT/ACT nasal spray [Pharmacy Med Name: CALCITONIN-SALMON 200 UNIT SPR]  1    Sig: Place 1 spray into alternate nostrils daily.     Endocrinology:  Parathyroid Agents Failed - 04/05/2024 10:54 AM      Failed - Bone Mineral Density or Dexa Scan completed in the last 2 years      Passed - Ca in normal range and within 360 days    Calcium   Date Value Ref Range Status  12/29/2023 10.1 8.7 - 10.3 mg/dL Final         Passed - Valid encounter within last 12 months    Recent Outpatient Visits           1 month ago Hypothyroidism, unspecified type   [facility] at [facility], Vinay K, MD   2 months ago Other chronic back pain   [facility] at [facility] Kotturi, Vinay K, MD   3 months ago Essential hypertension   [place] [facility] at [facility] JAYSON, MD   3 months ago Acute midline low back pain without sciatica   [facility] & Sports Medicine at [facility] JAYSON, MD

## 2024-04-05 NOTE — Telephone Encounter (Signed)
 Can you inform her she needs to request the same from her Endo. Thanks

## 2024-04-05 NOTE — Telephone Encounter (Signed)
 Please sign if appropriate

## 2024-05-05 NOTE — Progress Notes (Signed)
 Remote PPM Transmission

## 2024-06-01 DIAGNOSIS — I129 Hypertensive chronic kidney disease with stage 1 through stage 4 chronic kidney disease, or unspecified chronic kidney disease: Secondary | ICD-10-CM | POA: Diagnosis not present

## 2024-06-01 DIAGNOSIS — N2581 Secondary hyperparathyroidism of renal origin: Secondary | ICD-10-CM | POA: Diagnosis not present

## 2024-06-01 DIAGNOSIS — R600 Localized edema: Secondary | ICD-10-CM | POA: Diagnosis not present

## 2024-06-01 DIAGNOSIS — N1832 Chronic kidney disease, stage 3b: Secondary | ICD-10-CM | POA: Diagnosis not present

## 2024-06-01 DIAGNOSIS — D631 Anemia in chronic kidney disease: Secondary | ICD-10-CM | POA: Diagnosis not present

## 2024-06-01 DIAGNOSIS — I10 Essential (primary) hypertension: Secondary | ICD-10-CM | POA: Diagnosis not present

## 2024-06-01 DIAGNOSIS — R809 Proteinuria, unspecified: Secondary | ICD-10-CM | POA: Diagnosis not present

## 2024-06-07 ENCOUNTER — Other Ambulatory Visit: Payer: Self-pay | Admitting: Nephrology

## 2024-06-07 DIAGNOSIS — N1832 Chronic kidney disease, stage 3b: Secondary | ICD-10-CM

## 2024-06-07 DIAGNOSIS — R1085 Abdominal pain of multiple sites: Secondary | ICD-10-CM | POA: Diagnosis not present

## 2024-06-07 DIAGNOSIS — N2581 Secondary hyperparathyroidism of renal origin: Secondary | ICD-10-CM | POA: Diagnosis not present

## 2024-06-07 DIAGNOSIS — D631 Anemia in chronic kidney disease: Secondary | ICD-10-CM | POA: Diagnosis not present

## 2024-06-07 DIAGNOSIS — I10 Essential (primary) hypertension: Secondary | ICD-10-CM | POA: Diagnosis not present

## 2024-06-07 DIAGNOSIS — I129 Hypertensive chronic kidney disease with stage 1 through stage 4 chronic kidney disease, or unspecified chronic kidney disease: Secondary | ICD-10-CM | POA: Diagnosis not present

## 2024-06-10 ENCOUNTER — Ambulatory Visit
Admission: RE | Admit: 2024-06-10 | Discharge: 2024-06-10 | Disposition: A | Discharge status: Home / Self Care | Source: Ambulatory Visit | Attending: Nephrology | Admitting: Nephrology

## 2024-06-10 DIAGNOSIS — N1832 Chronic kidney disease, stage 3b: Secondary | ICD-10-CM | POA: Diagnosis not present

## 2024-06-10 DIAGNOSIS — R1085 Abdominal pain of multiple sites: Secondary | ICD-10-CM | POA: Insufficient documentation

## 2024-06-10 DIAGNOSIS — R109 Unspecified abdominal pain: Secondary | ICD-10-CM | POA: Diagnosis not present

## 2024-06-17 ENCOUNTER — Other Ambulatory Visit: Payer: Self-pay | Admitting: Cardiovascular Disease

## 2024-06-17 DIAGNOSIS — I10 Essential (primary) hypertension: Secondary | ICD-10-CM

## 2024-06-21 ENCOUNTER — Telehealth: Payer: Self-pay | Admitting: Family Medicine

## 2024-06-21 ENCOUNTER — Ambulatory Visit: Payer: Medicare HMO

## 2024-06-21 DIAGNOSIS — I495 Sick sinus syndrome: Secondary | ICD-10-CM | POA: Diagnosis not present

## 2024-06-21 LAB — CUP PACEART REMOTE DEVICE CHECK
Battery Remaining Longevity: 124 mo
Battery Voltage: 3.02 V
Brady Statistic AP VP Percent: 80.11 %
Brady Statistic AP VS Percent: 0.04 %
Brady Statistic AS VP Percent: 18.92 %
Brady Statistic AS VS Percent: 0.93 %
Brady Statistic RA Percent Paced: 80.82 %
Brady Statistic RV Percent Paced: 99.03 %
Date Time Interrogation Session: 20251111035132
Implantable Lead Connection Status: 753985
Implantable Lead Connection Status: 753985
Implantable Lead Implant Date: 20240807
Implantable Lead Implant Date: 20240807
Implantable Lead Location: 753859
Implantable Lead Location: 753860
Implantable Lead Model: 3830
Implantable Lead Model: 5076
Implantable Pulse Generator Implant Date: 20240807
Lead Channel Impedance Value: 323 Ohm
Lead Channel Impedance Value: 323 Ohm
Lead Channel Impedance Value: 418 Ohm
Lead Channel Impedance Value: 418 Ohm
Lead Channel Pacing Threshold Amplitude: 0.5 V
Lead Channel Pacing Threshold Amplitude: 1 V
Lead Channel Pacing Threshold Pulse Width: 0.4 ms
Lead Channel Pacing Threshold Pulse Width: 0.4 ms
Lead Channel Sensing Intrinsic Amplitude: 1.75 mV
Lead Channel Sensing Intrinsic Amplitude: 1.75 mV
Lead Channel Sensing Intrinsic Amplitude: 5.875 mV
Lead Channel Sensing Intrinsic Amplitude: 5.875 mV
Lead Channel Setting Pacing Amplitude: 1.5 V
Lead Channel Setting Pacing Amplitude: 2 V
Lead Channel Setting Pacing Pulse Width: 0.4 ms
Lead Channel Setting Sensing Sensitivity: 0.9 mV
Zone Setting Status: 755011
Zone Setting Status: 755011

## 2024-06-21 NOTE — Telephone Encounter (Signed)
 Copied from CRM #[id number]. Topic: Medicare AWV >> Jun 21, 2024 10:56 AM Nathanel DEL wrote: Called LVM 06/21/2024 to schedule AWV. Please schedule office or virtual visits  Nathanel Paschal; Care Guide Ambulatory Clinical Support [place] [facility] Direct Dial: [phone]

## 2024-06-22 ENCOUNTER — Ambulatory Visit: Payer: Self-pay | Admitting: Cardiovascular Disease

## 2024-06-24 NOTE — Progress Notes (Signed)
 Remote PPM Transmission

## 2024-07-01 ENCOUNTER — Other Ambulatory Visit: Payer: Self-pay

## 2024-07-01 DIAGNOSIS — E782 Mixed hyperlipidemia: Secondary | ICD-10-CM

## 2024-07-01 MED ORDER — SIMVASTATIN 20 MG PO TABS: 20.0000 mg | ORAL_TABLET | Freq: Every day | ORAL | 1 refills | Status: AC

## 2024-07-21 ENCOUNTER — Ambulatory Visit

## 2024-07-27 ENCOUNTER — Ambulatory Visit

## 2024-07-27 DIAGNOSIS — Z0000 Encounter for general adult medical examination without abnormal findings: Secondary | ICD-10-CM

## 2024-07-27 NOTE — Progress Notes (Signed)
 Chief Complaint  Patient presents with   Medicare Wellness     Subjective:   Carly Marshall is a 88 y.o. female who presents for a Medicare Annual Wellness Visit.  Visit info / Clinical Intake: Medicare Wellness Visit Type:: Subsequent Annual Wellness Visit Persons participating in visit and providing information:: patient Medicare Wellness Visit Mode:: Telephone If telephone:: video declined Since this visit was completed virtually, some vitals may be partially provided or unavailable. Missing vitals are due to the limitations of the virtual format.: Unable to obtain vitals - no equipment If Telephone or Video please confirm:: I connected with patient using audio/video enable telemedicine. I verified patient identity with two identifiers, discussed telehealth limitations, and patient agreed to proceed. Patient Location:: home Provider Location:: office Interpreter Needed?: No Pre-visit prep was completed: yes AWV questionnaire completed by patient prior to visit?: no Living arrangements:: (!) lives alone Patient's Overall Health Status Rating: very good Typical amount of pain: none Does pain affect daily life?: no Are you currently prescribed opioids?: no  Dietary Habits and Nutritional Risks How many meals a day?: 2 (one snack) Eats fruit and vegetables daily?: (!) no (most days) Most meals are obtained by: preparing own meals; eating out (50-50) In the last 2 weeks, have you had any of the following?: none Diabetic:: no  Functional Status Activities of Daily Living (to include ambulation/medication): Independent Ambulation: Independent Medication Administration: Independent Home Management (perform basic housework or laundry): Independent Manage your own finances?: yes Primary transportation is: driving Concerns about vision?: no *vision screening is required for [facility]* (wears glasses all day- DR.THURMOND) Concerns about hearing?: no  Fall Screening Falls in the past  year?: 0 Number of falls in past year: 0 Was there an injury with Fall?: 0 Fall Risk Category Calculator: 0 Patient Fall Risk Level: Low Fall Risk  Fall Risk Patient at Risk for Falls Due to: No Fall Risks Fall risk Follow up: Falls evaluation completed; Falls prevention discussed  Home and Transportation Safety: All rugs have non-skid backing?: yes All stairs or steps have railings?: yes Grab bars in the bathtub or shower?: yes Have non-skid surface in bathtub or shower?: yes Good home lighting?: yes Regular seat belt use?: yes Hospital stays in the last year:: no  Cognitive Assessment Difficulty concentrating, remembering, or making decisions? : no Will 6CIT or Mini Cog be Completed: yes What year is it?: 0 points What month is it?: 0 points Give patient an address phrase to remember (5 components): [address] [place], [place] what time is it?: 0 points Count backwards from 20 to 1: 0 points Say the months of the year in reverse: 0 points Repeat the address phrase from earlier: 0 points 6 CIT Score: 0 points  Advance Directives (For Healthcare) Does Patient Have a Medical Advance Directive?: Yes Does patient want to make changes to medical advance directive?: No - Patient declined Type of Advance Directive: Healthcare Power of [place]; Living will Copy of Healthcare Power of Attorney in Chart?: Yes - validated most recent copy scanned in chart (See row information) Copy of Living Will in Chart?: Yes - validated most recent copy scanned in chart (See row information)  Reviewed/Updated  Reviewed/Updated: Reviewed All (Medical, Surgical, Family, Medications, Allergies, Care Teams, Patient Goals)    Allergies (verified) Patient has no known allergies.   Current Medications (verified) Outpatient Encounter Medications as of 07/27/2024  Medication Sig   amLODipine  (NORVASC ) 5 MG tablet Take 1 tablet (5 mg total) by  mouth 2 (two) times daily.   apixaban  (ELIQUIS )  2.5 MG TABS tablet Take 1 tablet (2.5 mg total) by mouth 2 (two) times daily.   calcitonin, salmon, (MIACALCIN/FORTICAL) 200 UNIT/ACT nasal spray Place 1 spray into alternate nostrils daily.   Calcium  Carb-Cholecalciferol  (CALCIUM  600 + D PO) Take 1 capsule by mouth 2 (two) times daily.   Cholecalciferol  (VITAMIN D3) 50 MCG (2000 UT) CAPS Take 1 capsule by mouth daily.   cyanocobalamin  (VITAMIN B12) 1000 MCG/ML injection INJECT 1ML ONCE A MONTH   fluticasone  (FLONASE ) 50 MCG/ACT nasal spray Place 2 sprays into both nostrils daily.   hydrochlorothiazide  (HYDRODIURIL ) 12.5 MG tablet Take 1 tablet (12.5 mg total) by mouth daily.   levothyroxine  (SYNTHROID ) 50 MCG tablet Take 1 tablet (50 mcg total) by mouth daily.   magnesium oxide (MAG-OX) 400 (240 Mg) MG tablet Take 400 mg by mouth every other day.   Multiple Vitamins-Minerals (OCUVITE ADULT 50+ PO) Take 1 tablet by mouth daily.   simvastatin  (ZOCOR ) 20 MG tablet Take 1 tablet (20 mg total) by mouth daily.   valsartan  (DIOVAN ) 80 MG tablet Take 1 tablet (80 mg total) by mouth 2 (two) times daily.   vitamin C (ASCORBIC ACID) 500 MG tablet Take 500 mg by mouth daily.   No facility-administered encounter medications on file as of 07/27/2024.    History: Past Medical History:  Diagnosis Date   Allergy    B12 deficiency anemia    Hyperlipidemia    Hypertension    Stroke [facility])    Thyroid  disease    Past Surgical History:  Procedure Laterality Date   COLONOSCOPY  2012   normal- Dr Reeta   ECTOPIC PREGNANCY SURGERY     PACEMAKER IMPLANT N/A 03/18/2023   Procedure: PACEMAKER IMPLANT;  Surgeon: Nancey Eulas BRAVO, MD;  Location: [facility] INVASIVE CV LAB;  Service: Cardiovascular;  Laterality: N/A;   Family History  Problem Relation Age of Onset   Diabetes Mother    Heart disease Mother    Fibromyalgia Mother    Heart disease Father    Hyperlipidemia Father    Hypertension Father    Diabetes Sister    Brain cancer Brother    Rheum arthritis  Sister    Social History   Occupational History   Occupation: Retired   Occupation: Insurance Risk Surveyor: [employer]  Tobacco Use   Smoking status: Never    Passive exposure: Never   Smokeless tobacco: Never   Tobacco comments:    smoking cessation materials not required  Vaping Use   Vaping status: Never Used  Substance and Sexual Activity   Alcohol use: No    Alcohol/week: 0.0 standard drinks of alcohol   Drug use: No   Sexual activity: Not Currently   Tobacco Counseling Counseling given: Not Answered Tobacco comments: smoking cessation materials not required  SDOH Screenings   Food Insecurity: No Food Insecurity (07/27/2024)  Housing: Unknown (07/27/2024)  Transportation Needs: No Transportation Needs (07/27/2024)  Utilities: Not At Risk (07/27/2024)  Alcohol Screen: Low Risk (04/01/2023)  Depression (PHQ2-9): Low Risk (07/27/2024)  Financial Resource Strain: Low Risk  (01/11/2024)   [facility]  Physical Activity: Insufficiently Active (07/27/2024)  Social Connections: Moderately Integrated (07/27/2024)  Stress: No Stress Concern Present (07/27/2024)  Tobacco Use: Low Risk (07/27/2024)  Health Literacy: Adequate Health Literacy (07/27/2024)   See flowsheets for full screening details  Depression Screen PHQ 2 & 9 Depression Scale- Over the past 2 weeks,  how often have you been bothered by any of the following problems? Little interest or pleasure in doing things: 0 Feeling down, depressed, or hopeless (PHQ Adolescent also includes...irritable): 0 PHQ-2 Total Score: 0 Trouble falling or staying asleep, or sleeping too much: 0 Feeling tired or having little energy: 0 Poor appetite or overeating (PHQ Adolescent also includes...weight loss): 0 Feeling bad about yourself - or that you are a failure or have let yourself or your family down: 0 Trouble concentrating on things, such as reading the newspaper or watching  television (PHQ Adolescent also includes...like school work): 0 Moving or speaking so slowly that other people could have noticed. Or the opposite - being so fidgety or restless that you have been moving around a lot more than usual: 0 Thoughts that you would be better off dead, or of hurting yourself in some way: 0 PHQ-9 Total Score: 0 If you checked off any problems, how difficult have these problems made it for you to do your work, take care of things at home, or get along with other people?: Not difficult at all  Depression Treatment Depression Interventions/Treatment : EYV7-0 Score <4 Follow-up Not Indicated     Goals Addressed             This Visit's Progress    DIET - EAT MORE FRUITS AND VEGETABLES               Objective:    There were no vitals filed for this visit. There is no height or weight on file to calculate BMI.  Hearing/Vision screen Hearing Screening - Comments:: NO AIDS Vision Screening - Comments:: WEARS GLASSES ALL DAY- THURMOND Immunizations and Health Maintenance Health Maintenance  Topic Date Due   Zoster Vaccines- Shingrix (1 of 2) Never done   COVID-19 Vaccine (4 - 2025-26 season) 04/11/2024   DTaP/Tdap/Td (2 - Td or Tdap) 03/26/2025   Medicare Annual Wellness (AWV)  07/27/2025   Pneumococcal Vaccine: 50+ Years  Completed   Influenza Vaccine  Completed   Bone Density Scan  Completed   Meningococcal B Vaccine  Aged Out        Assessment/Plan:  This is a routine wellness examination for Albia.  Patient Care Team: Kotturi, Vinay K, MD as PCP - General (Family Medicine) Dennise Vale Aurora, MD as Consulting Physician (Nephrology) Elma Zachary RAMAN [facility])  I have personally reviewed and noted the following in the patients chart:   Medical and social history Use of alcohol, tobacco or illicit drugs  Current medications and supplements including opioid prescriptions. Functional ability and status Nutritional status Physical  activity Advanced directives List of other physicians Hospitalizations, surgeries, and ER visits in previous 12 months Vitals Screenings to include cognitive, depression, and falls Referrals and appointments  No orders of the defined types were placed in this encounter.  In addition, I have reviewed and discussed with patient certain preventive protocols, quality metrics, and best practice recommendations. A written personalized care plan for preventive services as well as general preventive health recommendations were provided to patient.   Jhonnie RAMAN Das, LPN   [id number]   Return in 1 year (on 07/27/2025).  After Visit Summary: (MyChart) Due to this being a telephonic visit, the after visit summary with patients personalized plan was offered to patient via MyChart   Nurse Notes: UTD ON SHOTS EXCEPT SHINGRIX; AGED OUT OF MAMMOGRAM, COLONOSCOPY & BDS

## 2024-07-27 NOTE — Patient Instructions (Addendum)
 Carly Marshall,  Thank you for taking the time for your Medicare Wellness Visit. I appreciate your continued commitment to your health goals. Please review the care plan we discussed, and feel free to reach out if I can assist you further.  Please note that Annual Wellness Visits do not include a physical exam. Some assessments may be limited, especially if the visit was conducted virtually. If needed, we may recommend an in-person follow-up with your provider.  Ongoing Care Seeing your primary care provider every 3 to 6 months helps us  monitor your health and provide consistent, personalized care.   Referrals If a referral was made during today's visit and you haven't received any updates within two weeks, please contact the referred provider directly to check on the status.  Recommended Screenings:  Health Maintenance  Topic Date Due   Zoster (Shingles) Vaccine (1 of 2) Never done   COVID-19 Vaccine (4 - 2025-26 season) 04/11/2024   DTaP/Tdap/Td vaccine (2 - Td or Tdap) 03/26/2025   Medicare Annual Wellness Visit  07/27/2025   Pneumococcal Vaccine for age over 77  Completed   Flu Shot  Completed   Osteoporosis screening with Bone Density Scan  Completed   Meningitis B Vaccine  Aged Out       07/27/2024    9:26 AM  Advanced Directives  Does Patient Have a Medical Advance Directive? Yes  Type of Estate Agent of [place];Living will  Does patient want to make changes to medical advance directive? No - Patient declined  Copy of Healthcare Power of Attorney in Chart? Yes - validated most recent copy scanned in chart (See row information)    Vision: Annual vision screenings are recommended for early detection of glaucoma, cataracts, and diabetic retinopathy. These exams can also reveal signs of chronic conditions such as diabetes and high blood pressure.  Dental: Annual dental screenings help detect early signs of oral cancer, gum disease, and other conditions  linked to overall health, including heart disease and diabetes.  Please see the attached documents for additional preventive care recommendations.   NEXT AWV 08/17/25 @ 1:50 PM IN PERSON

## 2024-08-06 ENCOUNTER — Other Ambulatory Visit: Payer: Self-pay | Admitting: Family Medicine

## 2024-08-06 DIAGNOSIS — E039 Hypothyroidism, unspecified: Secondary | ICD-10-CM

## 2024-08-08 NOTE — Telephone Encounter (Signed)
 Requested Prescriptions  Pending Prescriptions Disp Refills   levothyroxine  (SYNTHROID ) 50 MCG tablet [Pharmacy Med Name: LEVOTHYROXINE  50 MCG TABLET] 90 tablet 1    Sig: TAKE 1 TABLET BY MOUTH EVERY DAY     Endocrinology:  Hypothyroid Agents Passed - 08/08/2024  4:53 PM      Passed - TSH in normal range and within 360 days    TSH  Date Value Ref Range Status  02/23/2024 1.330 0.450 - 4.500 uIU/mL Final         Passed - Valid encounter within last 12 months    Recent Outpatient Visits           5 months ago Hypothyroidism, unspecified type   [facility] at [facility], Vinay K, MD   6 months ago Other chronic back pain   [place] [facility] & Sports Medicine at [facility] Kotturi, Vinay K, MD   7 months ago Essential hypertension   [place] [facility] at [facility] JAYSON, MD   8 months ago Acute midline low back pain without sciatica   [facility] at [facility] Jones, Deanna C, MD       Future Appointments             In 1 month Ursuy, Renee Lynn, PA-C [facility] Dept of [employer]. [facility] [employer], H[employer]

## 2024-08-22 ENCOUNTER — Other Ambulatory Visit: Payer: Self-pay

## 2024-08-22 DIAGNOSIS — I10 Essential (primary) hypertension: Secondary | ICD-10-CM

## 2024-08-22 MED ORDER — AMLODIPINE BESYLATE 5 MG PO TABS: 5.0000 mg | ORAL_TABLET | Freq: Two times a day (BID) | ORAL | 1 refills | Status: AC

## 2024-08-23 ENCOUNTER — Encounter: Payer: Self-pay | Admitting: Family Medicine

## 2024-08-23 ENCOUNTER — Ambulatory Visit: Admitting: Family Medicine

## 2024-08-23 VITALS — BP 130/64 | HR 77 | Temp 98.2°F | Ht 62.0 in | Wt 130.0 lb

## 2024-08-23 DIAGNOSIS — I495 Sick sinus syndrome: Secondary | ICD-10-CM

## 2024-08-23 DIAGNOSIS — Z950 Presence of cardiac pacemaker: Secondary | ICD-10-CM | POA: Diagnosis not present

## 2024-08-23 DIAGNOSIS — R058 Other specified cough: Secondary | ICD-10-CM | POA: Diagnosis not present

## 2024-08-23 DIAGNOSIS — N184 Chronic kidney disease, stage 4 (severe): Secondary | ICD-10-CM | POA: Diagnosis not present

## 2024-08-23 DIAGNOSIS — I509 Heart failure, unspecified: Secondary | ICD-10-CM

## 2024-08-23 DIAGNOSIS — I130 Hypertensive heart and chronic kidney disease with heart failure and stage 1 through stage 4 chronic kidney disease, or unspecified chronic kidney disease: Secondary | ICD-10-CM | POA: Insufficient documentation

## 2024-08-23 MED ORDER — BENZONATATE 100 MG PO CAPS: 100.0000 mg | ORAL_CAPSULE | Freq: Two times a day (BID) | ORAL | 0 refills | Status: AC | PRN

## 2024-08-23 NOTE — Progress Notes (Signed)
 "  Established Patient Office Visit  Patient ID: Carly Marshall, female    DOB: [birth year]  Age: 89 y.o. MRN: [id number] PCP: Jamacia Jester K, MD  Chief Complaint  Patient presents with   Medical Management of Chronic Issues   Cough    X2-3 weeks, unchanged, OTC cough medication, doesn't help, dry cough, has not been around anyone who is sick, no other symptoms     Subjective:     HPI  Discussed the use of AI scribe software for clinical note transcription with the patient, who gave verbal consent to proceed.  History of Present Illness Carly Marshall is an 89 year old female who presents with a dry cough.  She has experienced a mild, dry cough for the past couple of weeks, primarily occurring early in the morning upon waking. The cough does not disturb her during the day or night. She has not been exposed to anyone sick and has not consistently used over-the-counter medications, although she has some cough medicine at home. Her daughter has noted the frequency of her cough, and she wonders if it might be related to allergies. She also experiences a dry mouth upon waking and frequent sneezing in the morning.  She has a history of back trouble, which has significantly improved. She can perform most activities but needs to rest if pain arises. She uses a walker at home and when shopping, and brought a cane today for balance, though she does not use it much anymore. She recalls having severe back spasms in the past, particularly around April, May, and June of the previous year.  She is on Prolia  and recently received an injection. She tries to stay active and hydrated. She has kidney issues and is cautious with medications.  She has a pacemaker and is scheduled for a check-up in March. She has not experienced any problems with it recently.     Review of Systems  All other systems reviewed and are negative.     Objective:     BP 130/64   Pulse 77   Temp 98.2 F (36.8 C)    Ht 5' 2 (1.575 m)   Wt 130 lb (59 kg)   SpO2 97%   BMI 23.78 kg/m     Physical Exam Vitals and nursing note reviewed.  Constitutional:      Appearance: Normal appearance.  HENT:     Head: Normocephalic.     Right Ear: External ear normal.     Left Ear: External ear normal.  Eyes:     Conjunctiva/sclera: Conjunctivae normal.  Cardiovascular:     Rate and Rhythm: Normal rate.  Pulmonary:     Effort: Pulmonary effort is normal. No respiratory distress.  Abdominal:     Palpations: Abdomen is soft.  Musculoskeletal:        General: Normal range of motion.  Skin:    General: Skin is warm.  Neurological:     Mental Status: She is alert and oriented to person, place, and time.  Psychiatric:        Mood and Affect: Mood normal.     Physical Exam MEASUREMENTS: Weight- 130.   No results found for any visits on 08/23/24.     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  - Prescribed Tessalon  Perles. - Advised against over-the-counter cold medications affecting heart condition.  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 Advised against excessive  cold medication use due to heart and kidney impact. - Continue current management.  Sinus node dysfunction with pacemaker Pacemaker in place. Pacemaker check scheduled.  Chronic low back pain Significant improvement. Managed with rest, walker, and cane. Tylenol  recommended with caution due to kidney issues. - Continue using walker and cane. - Encouraged hydration and activity. - Use Tylenol  as needed.  Osteoporosis Managed with Prolia  injections. Recent injection received. Risk of fracture due to osteoporosis and falls. - Continue Prolia  injections. - Advised caution to prevent falls.    No follow-ups on file.    Zakia Sainato K Mckayla Mulcahey, MD [facility] at [facility]   "

## 2024-08-25 ENCOUNTER — Telehealth: Payer: Self-pay | Admitting: Physician Assistant

## 2024-08-25 NOTE — Telephone Encounter (Signed)
 Pt c/o medication issue:  1. Name of Medication: apixaban  (ELIQUIS ) 2.5 MG TABS tablet   2. How are you currently taking this medication (dosage and times per day)? No  3. Are you having a reaction (difficulty breathing--STAT)? No  4. What is your medication issue? Pt can no longer afford this medication and would like an alternative

## 2024-08-30 ENCOUNTER — Other Ambulatory Visit (HOSPITAL_COMMUNITY): Payer: Self-pay

## 2024-08-30 NOTE — Telephone Encounter (Signed)
 Her last echo looked normal to me. She has a $500 deductible and then copay would be about $50 per month. There is the option of the payment plan.

## 2024-08-30 NOTE — Telephone Encounter (Signed)
" ° °  Xarelto  is 206.14 per test claims due to deductible  Pradaxa generic is 94.51 for 30 days    Sent to see if can get xarelto  for pap "

## 2024-09-02 ENCOUNTER — Telehealth: Payer: Self-pay | Admitting: Pharmacy Technician

## 2024-09-02 NOTE — Telephone Encounter (Signed)
" ° °  She does not have cardiomyopathy "

## 2024-09-07 MED ORDER — DABIGATRAN ETEXILATE MESYLATE 150 MG PO CAPS: 150.0000 mg | ORAL_CAPSULE | Freq: Two times a day (BID) | ORAL | 11 refills | Status: AC

## 2024-09-07 NOTE — Addendum Note (Signed)
 Addended by: Treysen Sudbeck D on: 09/07/2024 08:12 AM   Modules accepted: Orders

## 2024-09-15 NOTE — Progress Notes (Unsigned)
 " Cardiology Office Note:  .   Date:  09/15/2024  ID:  Carly Marshall, DOB Jan 21, 1936, MRN [id number] PCP: Kotturi, Vinay K, MD  [facility] Providers Cardiologist:  Dr. Perla EP: Dr. Nancey  History of Present Illness: .   Carly Marshall is a 89 y.o. 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 Nancey Nov 2024, doing well, no changes made Mentioning no AFib via device given her prior hx of TIA  I saw her 12/29/23 She feels well Has been bothered for a couple week with back spasms, her PMD treated with prednisone  and muscle relaxer and are much better. No CP, palpitations or cardiac awareness No SOB, DOE No near syncope or syncope Device check noted an episode of AFlutter lasting just over 5 hours and another for seconds  Discussed with the pt, given hx of TIA > rec OAC, she was agreeable Labs done, initially rec xarelto  > was very expensive > Eliquis  2.5mg  BID  She saw Dr. Gollan 01/25/24 C/w back trouble, no cardiac complaints No changes were made  I saw her 03/08/24 She is accompanied by her daughter Doing well No cardiac awareness or concerns Tolerating Eliquis , no bleeding or signs of bleeding Back pain (compression fractures) is better of late, getting around better + AFib ~ 5 hours, low overall burden Tolerating OAC   Today's visit is scheduled as a 67mo visit ROS:   She comes today with her daughter She is tolerating Eliquis , but is way too expensive. Seems in d/w Dr. Gollan, plans for pradaxa  via [facility] Doing well No new complaints or concerns She sometimes gets a brief funny feeling in her heart beat, no pain, not racing No near syncope or syncope No SOB   Device information MDT dual chamber PPM implanted 03/17/24   Studies Reviewed: SABRA    EKG not done today  DEVICE interrogation done today to assess arrhythmia burden and reviewed by  myself Battery and  lead measurements are good AFib burden <1 %, longest episode ~2 hours, rate controlled/paced One NSVT (3 seconds) RV Threshold  1.5/0.4 > auto threshold w/output to 3.5/0.4 1.0/0.8 > programmed to monitor, fixed output 2.5/0.8   TTE March 18, 2023 EF 60 to 65%.  Left atrium moderately dilated   02/08/2019: TTE 1. The left ventricle has normal systolic function with an ejection  fraction of 60-65%. The cavity size was normal. There is moderately  increased left ventricular wall thickness. Left ventricular diastolic  parameters were normal.   2. The right ventricle has normal systolic function. The cavity was  normal. There is no increase in right ventricular wall thickness.   3. Mitral valve regurgitation is mild to moderate by color flow Doppler.   4. The aortic valve is tricuspid. Aortic valve regurgitation was not  assessed by color flow Doppler.    Risk Assessment/Calculations:    Physical Exam:   VS:  There were no vitals taken for this visit.   Wt Readings from Last 3 Encounters:  08/23/24 130 lb (59 kg)  03/08/24 131 lb 11.2 oz (59.7 kg)  02/23/24 133 lb 2 oz (60.4 kg)    GEN: Well nourished, well developed in no acute distress NECK: No JVD; No carotid bruits CARDIAC:  RRR, no murmurs, rubs, gallops RESPIRATORY: CTA b/l without rales, wheezing or rhonchi  ABDOMEN: Soft, non-tender, non-distended EXTREMITIES: No edema; No deformity  PPM site: is stable, no thinning, fluctuation, tethering  ASSESSMENT AND PLAN: .    PPM intact function Programming changes as above  2. Paroxysmal atrial fibrillation/AFlutter CHA2DS2Vasc is 5 on eliquis  <1% burden  She is planned to change to Pradaxa , 150mg  BID was called in, though her Calc CrCl using May labs is 28 >> would be 75 mg dose. Will get labs today She has enough Eliquis  to get to/through next week Unfortunately she already picked up the 150mg  capsules.  If we need to change, hopefully she can  return them   3. Secondary hypercoagulable state   She lives in [place], that office would be much more convenient, will have her see Suzann next  Dispo: back in 6 mo, sooner if needed  Signed, Charlies Macario Arthur, PA-C   "

## 2024-09-16 ENCOUNTER — Ambulatory Visit: Admitting: Physician Assistant

## 2024-09-16 VITALS — BP 124/62 | HR 76 | Ht 60.0 in | Wt 128.0 lb

## 2024-09-16 DIAGNOSIS — Z950 Presence of cardiac pacemaker: Secondary | ICD-10-CM

## 2024-09-16 DIAGNOSIS — D6869 Other thrombophilia: Secondary | ICD-10-CM

## 2024-09-16 DIAGNOSIS — I4892 Unspecified atrial flutter: Secondary | ICD-10-CM

## 2024-09-16 DIAGNOSIS — I495 Sick sinus syndrome: Secondary | ICD-10-CM

## 2024-09-16 NOTE — Patient Instructions (Signed)
 Medication Instructions:   Your physician recommends that you continue on your current medications as directed. Please refer to the Current Medication list given to you today.   *If you need a refill on your cardiac medications before your next appointment, please call your pharmacy*   Lab Work:  PLEASE GO DOWN STAIRS  [facility]  FIRST FLOOR   ( GET OFF ELEVATORS WALK TOWARDS WAITING AREA LAB LOCATED BY PHARMACY):   BMET AND CBC TODAY      If you have labs (blood work) drawn today and your tests are completely normal, you will receive your results only by: MyChart Message (if you have MyChart) OR A paper copy in the mail If you have any lab test that is abnormal or we need to change your treatment, we will call you to review the results.    Testing/Procedures: NONE ORDERED  TODAY    Follow-Up: At [facility], you and your health needs are our priority.  As part of our continuing mission to provide you with exceptional heart care, our providers are all part of one team.  This team includes your primary Cardiologist (physician) and Advanced Practice Providers or APPs (Physician Assistants and Nurse Practitioners) who all work together to provide you with the care you need, when you need it.  Your next appointment:    6 month(s)   Provider:    Suzann Riddle, NP    We recommend signing up for the patient portal called MyChart.  Sign up information is provided on this After Visit Summary.  MyChart is used to connect with patients for Virtual Visits (Telemedicine).  Patients are able to view lab/test results, encounter notes, upcoming appointments, etc.  Non-urgent messages can be sent to your provider as well.   To learn more about what you can do with MyChart, go to [email or web address].   Other Instructions:

## 2024-09-20 ENCOUNTER — Encounter

## 2024-12-20 ENCOUNTER — Encounter

## 2025-02-21 ENCOUNTER — Ambulatory Visit: Admitting: Family Medicine

## 2025-03-21 ENCOUNTER — Encounter

## 2025-08-17 ENCOUNTER — Ambulatory Visit
